# Patient Record
Sex: Female | Born: 1960 | Race: White | Hispanic: No | Smoking: Former smoker
Health system: Southern US, Community
[De-identification: ages and names within clinical notes are randomized; demographics above are authoritative.]

## PROBLEM LIST (undated history)

## (undated) DIAGNOSIS — F419 Anxiety disorder, unspecified: Secondary | ICD-10-CM

## (undated) DIAGNOSIS — L732 Hidradenitis suppurativa: Secondary | ICD-10-CM

## (undated) DIAGNOSIS — R0602 Shortness of breath: Secondary | ICD-10-CM

## (undated) DIAGNOSIS — I10 Essential (primary) hypertension: Secondary | ICD-10-CM

## (undated) DIAGNOSIS — F32A Depression, unspecified: Secondary | ICD-10-CM

## (undated) DIAGNOSIS — G4730 Sleep apnea, unspecified: Secondary | ICD-10-CM

## (undated) DIAGNOSIS — I119 Hypertensive heart disease without heart failure: Secondary | ICD-10-CM

## (undated) HISTORY — PX: CYSTECTOMY: SUR359

## (undated) HISTORY — DX: Shortness of breath: R06.02

## (undated) HISTORY — DX: Hypertensive heart disease without heart failure: I11.9

## (undated) HISTORY — DX: Hidradenitis suppurativa: L73.2

---

## 1983-02-14 HISTORY — PX: HERNIA REPAIR: SHX51

## 1994-08-14 ENCOUNTER — Encounter (INDEPENDENT_AMBULATORY_CARE_PROVIDER_SITE_OTHER): Payer: Self-pay | Admitting: Internal Medicine

## 1994-08-14 LAB — CONVERTED CEMR LAB: Pap Smear: NORMAL

## 1996-10-14 ENCOUNTER — Encounter (INDEPENDENT_AMBULATORY_CARE_PROVIDER_SITE_OTHER): Payer: Self-pay | Admitting: Internal Medicine

## 1996-10-14 LAB — CONVERTED CEMR LAB: Pap Smear: NORMAL

## 1997-05-18 ENCOUNTER — Encounter: Admission: RE | Admit: 1997-05-18 | Discharge: 1997-08-16 | Payer: Self-pay | Admitting: Specialist

## 1998-09-10 ENCOUNTER — Ambulatory Visit (HOSPITAL_COMMUNITY): Admission: RE | Admit: 1998-09-10 | Discharge: 1998-09-10 | Payer: Self-pay | Admitting: Surgery

## 1999-05-04 ENCOUNTER — Encounter: Payer: Self-pay | Admitting: Family Medicine

## 1999-05-04 ENCOUNTER — Encounter: Admission: RE | Admit: 1999-05-04 | Discharge: 1999-05-04 | Payer: Self-pay | Admitting: Family Medicine

## 1999-08-14 ENCOUNTER — Encounter (INDEPENDENT_AMBULATORY_CARE_PROVIDER_SITE_OTHER): Payer: Self-pay | Admitting: Internal Medicine

## 1999-08-14 LAB — CONVERTED CEMR LAB: Pap Smear: NORMAL

## 1999-09-07 ENCOUNTER — Other Ambulatory Visit: Admission: RE | Admit: 1999-09-07 | Discharge: 1999-09-07 | Payer: Self-pay | Admitting: Family Medicine

## 2003-02-14 HISTORY — PX: KNEE ARTHROSCOPY: SUR90

## 2003-06-17 ENCOUNTER — Encounter: Admission: RE | Admit: 2003-06-17 | Discharge: 2003-06-17 | Payer: Self-pay | Admitting: Orthopedic Surgery

## 2003-07-01 ENCOUNTER — Ambulatory Visit (HOSPITAL_BASED_OUTPATIENT_CLINIC_OR_DEPARTMENT_OTHER): Admission: RE | Admit: 2003-07-01 | Discharge: 2003-07-01 | Payer: Self-pay | Admitting: Orthopedic Surgery

## 2003-12-01 ENCOUNTER — Ambulatory Visit: Payer: Self-pay | Admitting: Family Medicine

## 2004-04-18 ENCOUNTER — Ambulatory Visit: Payer: Self-pay | Admitting: Family Medicine

## 2004-06-23 ENCOUNTER — Ambulatory Visit: Payer: Self-pay | Admitting: Family Medicine

## 2004-10-18 ENCOUNTER — Ambulatory Visit: Payer: Self-pay | Admitting: Family Medicine

## 2004-10-21 ENCOUNTER — Ambulatory Visit: Payer: Self-pay | Admitting: Family Medicine

## 2004-12-05 ENCOUNTER — Ambulatory Visit: Payer: Self-pay | Admitting: Family Medicine

## 2005-01-16 ENCOUNTER — Ambulatory Visit: Payer: Self-pay | Admitting: Family Medicine

## 2005-01-23 ENCOUNTER — Ambulatory Visit: Payer: Self-pay | Admitting: Family Medicine

## 2005-02-13 HISTORY — PX: ENDOMETRIAL ABLATION W/ NOVASURE: SUR434

## 2005-08-29 ENCOUNTER — Ambulatory Visit (HOSPITAL_COMMUNITY): Admission: RE | Admit: 2005-08-29 | Discharge: 2005-08-29 | Payer: Self-pay | Admitting: Obstetrics and Gynecology

## 2005-08-29 ENCOUNTER — Encounter (INDEPENDENT_AMBULATORY_CARE_PROVIDER_SITE_OTHER): Payer: Self-pay | Admitting: *Deleted

## 2005-10-30 ENCOUNTER — Ambulatory Visit: Payer: Self-pay | Admitting: Family Medicine

## 2006-05-16 ENCOUNTER — Encounter: Payer: Self-pay | Admitting: Internal Medicine

## 2006-05-16 DIAGNOSIS — I10 Essential (primary) hypertension: Secondary | ICD-10-CM

## 2006-05-16 DIAGNOSIS — N915 Oligomenorrhea, unspecified: Secondary | ICD-10-CM

## 2006-05-16 DIAGNOSIS — F17200 Nicotine dependence, unspecified, uncomplicated: Secondary | ICD-10-CM

## 2006-05-17 ENCOUNTER — Ambulatory Visit: Payer: Self-pay | Admitting: Family Medicine

## 2006-11-06 ENCOUNTER — Ambulatory Visit: Payer: Self-pay | Admitting: Family Medicine

## 2006-11-12 ENCOUNTER — Telehealth (INDEPENDENT_AMBULATORY_CARE_PROVIDER_SITE_OTHER): Payer: Self-pay | Admitting: *Deleted

## 2006-12-10 ENCOUNTER — Telehealth (INDEPENDENT_AMBULATORY_CARE_PROVIDER_SITE_OTHER): Payer: Self-pay | Admitting: *Deleted

## 2006-12-11 ENCOUNTER — Ambulatory Visit: Payer: Self-pay | Admitting: Family Medicine

## 2006-12-11 ENCOUNTER — Telehealth (INDEPENDENT_AMBULATORY_CARE_PROVIDER_SITE_OTHER): Payer: Self-pay | Admitting: Internal Medicine

## 2007-01-03 ENCOUNTER — Ambulatory Visit: Payer: Self-pay

## 2007-01-03 ENCOUNTER — Encounter (INDEPENDENT_AMBULATORY_CARE_PROVIDER_SITE_OTHER): Payer: Self-pay | Admitting: Internal Medicine

## 2007-01-31 ENCOUNTER — Encounter (INDEPENDENT_AMBULATORY_CARE_PROVIDER_SITE_OTHER): Payer: Self-pay | Admitting: *Deleted

## 2007-07-03 ENCOUNTER — Telehealth (INDEPENDENT_AMBULATORY_CARE_PROVIDER_SITE_OTHER): Payer: Self-pay | Admitting: Internal Medicine

## 2007-08-14 ENCOUNTER — Ambulatory Visit: Payer: Self-pay | Admitting: Family Medicine

## 2007-08-14 DIAGNOSIS — N6089 Other benign mammary dysplasias of unspecified breast: Secondary | ICD-10-CM

## 2007-09-03 ENCOUNTER — Telehealth (INDEPENDENT_AMBULATORY_CARE_PROVIDER_SITE_OTHER): Payer: Self-pay | Admitting: Internal Medicine

## 2007-09-04 ENCOUNTER — Ambulatory Visit: Payer: Self-pay | Admitting: Family Medicine

## 2007-09-06 ENCOUNTER — Telehealth: Payer: Self-pay | Admitting: Family Medicine

## 2009-04-01 ENCOUNTER — Encounter: Payer: Self-pay | Admitting: Cardiology

## 2010-07-01 NOTE — Op Note (Signed)
NAME:  Savannah Roy, Savannah Roy              ACCOUNT NO.:  [id number]   MEDICAL RECORD NO.:  [id number]          PATIENT TYPE:  AMB   LOCATION:  SDC                           FACILITY:  [facility]   PHYSICIAN:  Lenoard Aden, M.D.DATE OF BIRTH:  01-May-1960   DATE OF PROCEDURE:  08/29/2005  DATE OF DISCHARGE:                                 OPERATIVE REPORT   PREOPERATIVE DIAGNOSIS:  Menorrhagia, dysmenorrhea.   POSTOPERATIVE DIAGNOSIS:  Menorrhagia, dysmenorrhea.   OPERATION PERFORMED:  Diagnostic hysteroscopy, dilation and curettage,  NovaSure endometrial ablation.   SURGEON:  Lenoard Aden, M.D.   ANESTHESIA:  General.   ESTIMATED BLOOD LOSS:  50 mL.   COMPLICATIONS:  None.   FLUID DEFICIT:  45 mL.   Patient to recovery in good condition.   SPECIMENS:  Endometrial curettings to pathology.   DESCRIPTION OF PROCEDURE:  After being apprised of the risks of anesthesia,  infection, bleeding, injury to abdominal with need for repair, delayed  versus immediate complications to include bowel or bladder injury, the  patient was brought to the operating room.  She was administered general  anesthetic without complications, prepped and draped in the usual sterile  fashion, catheterized until the bladder was empty.  After placing a  tenaculum on the anterior lip of the cervix and a weighted speculum, the  uterus was dilated up to a #24 Pratt dilator.  Hysteroscope placed.  Visualization reveals a limited but otherwise normal view of the endometrial  cavity, previous sonohysterogram which has confirmed no evidence of  structural defects.  Dilation and curettage was performed for a moderate  amount of tissue which was removed.  Hysteroscope replaced revealing a  normal endometrial cavity.  At this time attempts were made to enter the  NovaSure which met with difficulty due to the acute angulation of the  patient's uterus and the acute anteversion and the patient's obesity, so the  cervix was  further dilated up to #43 [facility] dilator.  At this time the  speculum was removed and NovaSure was able to be placed.  Cavity width of  3.5 cm was noted after seating the device.  CO2 testing was negative.  Procedure was initiated with the ablation occurring over a  period of 67 seconds without difficulty.  Device was removed.  Minimal  bleeding was noted.  All instruments were removed from the uterus and from  the vagina.  The patient tolerated the procedure well and was transferred to  recovery in good condition.      Lenoard Aden, M.D.  Electronically Signed     RJT/MEDQ  D:  08/29/2005  T:  08/29/2005  Job:  [id number]

## 2010-07-01 NOTE — Op Note (Signed)
NAME:  Savannah Roy, LAPINE                        ACCOUNT NO.:  [id number]   MEDICAL RECORD NO.:  [id number]                   PATIENT TYPE:  AMB   LOCATION:  DSC                                  FACILITY:  [facility]   PHYSICIAN:  Mila Homer. Sherlean Foot, M.D.              DATE OF BIRTH:  02-10-1961   DATE OF PROCEDURE:  07/01/2003  DATE OF DISCHARGE:                                 OPERATIVE REPORT   PREOPERATIVE DIAGNOSIS:  Right knee meniscus tear and osteoarthritis.   POSTOPERATIVE DIAGNOSIS:  Right knee meniscus tear and osteoarthritis.   OPERATION PERFORMED:  Right knee arthroscopy with partial lateral  meniscectomy, chondroplasty of the lateral, medial and patellofemoral  compartments.   SURGEON:  Mila Homer. Sherlean Foot, M.D.   ASSISTANT:  None.   ANESTHESIA:  General.   INDICATIONS FOR PROCEDURE:  The patient is a 50 year old obese female with  mechanical symptoms and osteoarthritis.  Informed consent was obtained.   DESCRIPTION OF PROCEDURE:  The patient was laid supine and administered MAC  anesthesia.  The right lower extremity was prepped and draped in the usual  sterile fashion.  A #11 blade and blunt trocar and cannula were used to  create inferolateral and inferomedial portals.  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  I used a Biochemist, clinical to perform a chondroplasty on the  patella.  Also some on the trochlea.  There was a small area of grade 4  chondromalacia on the trochlea.  We then went into the medial compartment.  There were some femoral condylar grade 3 and 4 changes in small areas.  I  debrided that as well.  Meniscus was fine.  The ACL and PCL were fine.  There was a very large anterior and posterior horn tear of the lateral  meniscus.  These were debrided with basket forceps, [place] shavers and  a debridement wand.  I then lavaged the knee and closed with 4-0 nylon  sutures, dressed with  Adaptic, 4 x 4s, sterile Webril and an Ace wrap.   COMPLICATIONS:  None.   DRAINS:  None.                                               Mila Homer. Sherlean Foot, M.D.    SDL/MEDQ  D:  07/01/2003  T:  07/02/2003  Job:  [id number]

## 2012-03-21 LAB — LIPID PANEL
CHOLESTEROL: 221 mg/dL — AB (ref 0–200)
HDL: 31 mg/dL — AB (ref 35–70)
LDL Cholesterol: 164 mg/dL
Triglycerides: 128 mg/dL (ref 40–160)

## 2012-03-21 LAB — HEMOGLOBIN A1C: Hemoglobin A1C: 5.3

## 2012-03-21 LAB — TSH: TSH: 3.68 u[IU]/mL (ref 0.41–5.90)

## 2012-03-21 LAB — HM PAP SMEAR: HM PAP: NEGATIVE

## 2014-06-06 ENCOUNTER — Emergency Department (HOSPITAL_COMMUNITY)
Admission: EM | Admit: 2014-06-06 | Discharge: 2014-06-06 | Disposition: A | Discharge status: Home / Self Care | Payer: BLUE CROSS/BLUE SHIELD | Attending: Emergency Medicine | Admitting: Emergency Medicine

## 2014-06-06 ENCOUNTER — Encounter (HOSPITAL_COMMUNITY): Payer: Self-pay | Admitting: Emergency Medicine

## 2014-06-06 ENCOUNTER — Emergency Department (HOSPITAL_COMMUNITY): Payer: BLUE CROSS/BLUE SHIELD

## 2014-06-06 DIAGNOSIS — I10 Essential (primary) hypertension: Secondary | ICD-10-CM | POA: Insufficient documentation

## 2014-06-06 DIAGNOSIS — R112 Nausea with vomiting, unspecified: Secondary | ICD-10-CM | POA: Insufficient documentation

## 2014-06-06 DIAGNOSIS — R1013 Epigastric pain: Secondary | ICD-10-CM | POA: Diagnosis present

## 2014-06-06 DIAGNOSIS — Z87891 Personal history of nicotine dependence: Secondary | ICD-10-CM | POA: Insufficient documentation

## 2014-06-06 HISTORY — DX: Essential (primary) hypertension: I10

## 2014-06-06 LAB — URINALYSIS, ROUTINE W REFLEX MICROSCOPIC
Glucose, UA: NEGATIVE mg/dL
Hgb urine dipstick: NEGATIVE
Leukocytes, UA: NEGATIVE
NITRITE: NEGATIVE
PH: 5.5 (ref 5.0–8.0)
Protein, ur: 30 mg/dL — AB
Specific Gravity, Urine: >1.03 — ABNORMAL HIGH (ref 1.005–1.030)
UROBILINOGEN UA: 1 mg/dL (ref 0.0–1.0)

## 2014-06-06 LAB — URINE MICROSCOPIC-ADD ON

## 2014-06-06 LAB — CBC WITH DIFFERENTIAL/PLATELET
BASOS ABS: 0 10*3/uL (ref 0.0–0.1)
BASOS PCT: 0 % (ref 0–1)
EOS ABS: 0.1 10*3/uL (ref 0.0–0.7)
EOS PCT: 1 % (ref 0–5)
HCT: 45.2 % (ref 36.0–46.0)
Hemoglobin: 14.6 g/dL (ref 12.0–15.0)
Lymphocytes Relative: 16 % (ref 12–46)
Lymphs Abs: 0.9 10*3/uL (ref 0.7–4.0)
MCH: 28.7 pg (ref 26.0–34.0)
MCHC: 32.3 g/dL (ref 30.0–36.0)
MCV: 88.8 fL (ref 78.0–100.0)
Monocytes Absolute: 0.4 10*3/uL (ref 0.1–1.0)
Monocytes Relative: 7 % (ref 3–12)
Neutro Abs: 4.2 10*3/uL (ref 1.7–7.7)
Neutrophils Relative %: 76 % (ref 43–77)
PLATELETS: 200 10*3/uL (ref 150–400)
RBC: 5.09 MIL/uL (ref 3.87–5.11)
RDW: 14.9 % (ref 11.5–15.5)
WBC: 5.6 10*3/uL (ref 4.0–10.5)

## 2014-06-06 LAB — COMPREHENSIVE METABOLIC PANEL
ALK PHOS: 70 U/L (ref 39–117)
ALT: 59 U/L — AB (ref 0–35)
AST: 58 U/L — AB (ref 0–37)
Albumin: 4 g/dL (ref 3.5–5.2)
Anion gap: 11 (ref 5–15)
BILIRUBIN TOTAL: 2.4 mg/dL — AB (ref 0.3–1.2)
BUN: 20 mg/dL (ref 6–23)
CHLORIDE: 104 mmol/L (ref 96–112)
CO2: 23 mmol/L (ref 19–32)
Calcium: 8.5 mg/dL (ref 8.4–10.5)
Creatinine, Ser: 1.15 mg/dL — ABNORMAL HIGH (ref 0.50–1.10)
GFR, EST AFRICAN AMERICAN: 61 mL/min — AB (ref >90)
GFR, EST NON AFRICAN AMERICAN: 53 mL/min — AB (ref >90)
GLUCOSE: 107 mg/dL — AB (ref 70–99)
POTASSIUM: 3.6 mmol/L (ref 3.5–5.1)
SODIUM: 138 mmol/L (ref 135–145)
Total Protein: 7.3 g/dL (ref 6.0–8.3)

## 2014-06-06 LAB — LIPASE, BLOOD: Lipase: 20 U/L (ref 11–59)

## 2014-06-06 MED ORDER — ONDANSETRON HCL 4 MG/2ML IJ SOLN
4.0000 mg | Freq: Once | INTRAMUSCULAR | Status: AC
  Administered 2014-06-06: 4 mg via INTRAVENOUS
  Filled 2014-06-06: qty 2

## 2014-06-06 MED ORDER — MORPHINE SULFATE 4 MG/ML IJ SOLN
4.0000 mg | Freq: Once | INTRAMUSCULAR | Status: AC
  Administered 2014-06-06: 4 mg via INTRAVENOUS
  Filled 2014-06-06: qty 1

## 2014-06-06 MED ORDER — SODIUM CHLORIDE 0.9 % IV BOLUS (SEPSIS)
1000.0000 mL | Freq: Once | INTRAVENOUS | Status: AC
  Administered 2014-06-06: 1000 mL via INTRAVENOUS

## 2014-06-06 MED ORDER — DICYCLOMINE HCL 20 MG PO TABS: 20.0000 mg | ORAL_TABLET | Freq: Two times a day (BID) | ORAL | Status: DC

## 2014-06-06 MED ORDER — ONDANSETRON HCL 4 MG PO TABS: 4.0000 mg | ORAL_TABLET | Freq: Three times a day (TID) | ORAL | Status: DC | PRN

## 2014-06-06 MED ORDER — IOHEXOL 300 MG/ML  SOLN
50.0000 mL | Freq: Once | INTRAMUSCULAR | Status: AC | PRN
  Administered 2014-06-06: 50 mL via ORAL

## 2014-06-06 MED ORDER — IOHEXOL 300 MG/ML  SOLN
100.0000 mL | Freq: Once | INTRAMUSCULAR | Status: AC | PRN
  Administered 2014-06-06: 100 mL via INTRAVENOUS

## 2014-06-06 MED ORDER — DOCUSATE SODIUM 100 MG PO CAPS: 100.0000 mg | ORAL_CAPSULE | Freq: Two times a day (BID) | ORAL | Status: DC

## 2014-06-06 NOTE — ED Notes (Signed)
Pt sent from [facility].  States that she has been having lt sided burning abd pain.  States that she began throwing up on Wednesday and last vomiting was last night.  Was sent to r/o blockage.  States that last BM was Thursday and it was normal.

## 2014-06-06 NOTE — ED Provider Notes (Signed)
CSN: [id number]     Arrival date & time 06/06/14  1600 History   First MD Initiated Contact with Patient 06/06/14 1659     Chief Complaint  Patient presents with  . Abdominal Pain     (Consider location/radiation/quality/duration/timing/severity/associated sxs/prior Treatment) HPI  Savannah Roy is a 54 y.o. female complaining of multiple episodes of nonbloody, nonbilious, non-coffee ground emesis worsening over the course of 5 days with associated abdominal bloating, burning sensation in the epigastrium and left upper quadrant which she rates at 7 out of 10 and 2 episodes of smaller than normal, normally formed stool. Passing flatus and states that she is burping frequently. Feels that she is dehydrated, with decreased urinary output and concentrated urine without dysuria or hematuria. Denies fever, chills. Had single prior abdominal surgery of umbilical hernia repair in the 80s. Sent from urgent care, they are concerned for SBO.  Past Medical History  Diagnosis Date  . Hypertension    History reviewed. No pertinent past surgical history. History reviewed. No pertinent family history. History  Substance Use Topics  . Smoking status: Former Games developer  . Smokeless tobacco: Not on file  . Alcohol Use: No   OB History    No data available     Review of Systems  10 systems reviewed and found to be negative, except as noted in the HPI.   Allergies  Erythromycin  Home Medications   Prior to Admission medications   Not on File   BP 141/85 mmHg  Pulse 100  Temp(Src) 98.8 F (37.1 C) (Oral)  Resp 18  SpO2 100% Physical Exam  Constitutional: She is oriented to person, place, and time. She appears well-developed and well-nourished. No distress.  HENT:  Head: Normocephalic.  Eyes: Conjunctivae and EOM are normal.  Cardiovascular: Normal rate, regular rhythm and intact distal pulses.   Pulmonary/Chest: Effort normal. No stridor.  Abdominal:  Difficult to appreciate bowel  sounds secondary to habitus. Tender to palpation in the left upper quadrant with no guarding or rebound.  Musculoskeletal: Normal range of motion.  Neurological: She is alert and oriented to person, place, and time.  Psychiatric: She has a normal mood and affect.  Nursing note and vitals reviewed.   ED Course  Procedures (including critical care time) Labs Review Labs Reviewed  CBC WITH DIFFERENTIAL/PLATELET  COMPREHENSIVE METABOLIC PANEL  LIPASE, BLOOD  URINALYSIS, ROUTINE W REFLEX MICROSCOPIC     Imaging Review No results found.   EKG Interpretation None      MDM   Final diagnoses:  Non-intractable vomiting with nausea, vomiting of unspecified type    Filed Vitals:   06/06/14 1625  BP: 141/85  Pulse: 100  Temp: 98.8 F (37.1 C)  TempSrc: Oral  Resp: 18  SpO2: 100%    Medications  sodium chloride 0.9 % bolus 1,000 mL (not administered)  ondansetron (ZOFRAN) injection 4 mg (not administered)  morphine 4 MG/ML injection 4 mg (not administered)  iohexol (OMNIPAQUE) 300 MG/ML solution 50 mL (not administered)    Savannah Roy is a pleasant 54 y.o. female presenting with burning abdominal pain, nausea vomiting. Patient sent for urgent care concern for SBO. She is passing flatus normally. One prior abdominal surgery. Serial abdominal exams remained nonsurgical. Patient is tolerated her oral contrast without issue. No leukocytosis, she has a mild creatinine bump at 1.15, AST and ALTs are elevated slightly at 58 and 59 consistent with a nonspecific elevation from emesis. Urinalysis is contaminated but is on concerning for infection.  CT scan shows a distended loop of the jejunum in the left upper quadrant consistent with an atonic segment of bowel. Discussed case with attending physician and patient. Patient feels comfortable going home, she is tolerating by mouth. I will discharge her with Zofran, Bentyl and Colace. We've had an extensive discussion of return precautions.  Also given her gastroenterology referral.  Evaluation does not show pathology that would require ongoing emergent intervention or inpatient treatment. Pt is hemodynamically stable and mentating appropriately. Discussed findings and plan with patient/guardian, who agrees with care plan. All questions answered. Return precautions discussed and outpatient follow up given.   Discharge Medication List as of 06/06/2014  8:59 PM    START taking these medications   Details  dicyclomine (BENTYL) 20 MG tablet Take 1 tablet (20 mg total) by mouth 2 (two) times daily., Starting 06/06/2014, Until Discontinued, Print    docusate sodium (COLACE) 100 MG capsule Take 1 capsule (100 mg total) by mouth every 12 (twelve) hours., Starting 06/06/2014, Until Discontinued, Print    ondansetron (ZOFRAN) 4 MG tablet Take 1 tablet (4 mg total) by mouth every 8 (eight) hours as needed for nausea or vomiting., Starting 06/06/2014, Until Discontinued, Print             Savannah Emeryicole Katelynne Revak, PA-C 06/06/14 2207  Elwin MochaBlair Walden, MD 06/06/14 2227

## 2014-06-06 NOTE — Discharge Instructions (Signed)
Please follow with your primary care doctor in the next 2 days for a check-up. They must obtain records for further management.   Do not hesitate to return to the Emergency Department for any new, worsening or concerning symptoms.    Nausea and Vomiting Nausea is a sick feeling that often comes before throwing up (vomiting). Vomiting is a reflex where stomach contents come out of your mouth. Vomiting can cause severe loss of body fluids (dehydration). Children and elderly adults can become dehydrated quickly, especially if they also have diarrhea. Nausea and vomiting are symptoms of a condition or disease. It is important to find the cause of your symptoms. CAUSES   Direct irritation of the stomach lining. This irritation can result from increased acid production (gastroesophageal reflux disease), infection, food poisoning, taking certain medicines (such as nonsteroidal anti-inflammatory drugs), alcohol use, or tobacco use.  Signals from the brain.These signals could be caused by a headache, heat exposure, an inner ear disturbance, increased pressure in the brain from injury, infection, a tumor, or a concussion, pain, emotional stimulus, or metabolic problems.  An obstruction in the gastrointestinal tract (bowel obstruction).  Illnesses such as diabetes, hepatitis, gallbladder problems, appendicitis, kidney problems, cancer, sepsis, atypical symptoms of a heart attack, or eating disorders.  Medical treatments such as chemotherapy and radiation.  Receiving medicine that makes you sleep (general anesthetic) during surgery. DIAGNOSIS Your caregiver may ask for tests to be done if the problems do not improve after a few days. Tests may also be done if symptoms are severe or if the reason for the nausea and vomiting is not clear. Tests may include:  Urine tests.  Blood tests.  Stool tests.  Cultures (to look for evidence of infection).  X-rays or other imaging studies. Test results can  help your caregiver make decisions about treatment or the need for additional tests. TREATMENT You need to stay well hydrated. Drink frequently but in small amounts.You may wish to drink water, sports drinks, clear broth, or eat frozen ice pops or gelatin dessert to help stay hydrated.When you eat, eating slowly may help prevent nausea.There are also some antinausea medicines that may help prevent nausea. HOME CARE INSTRUCTIONS   Take all medicine as directed by your caregiver.  If you do not have an appetite, do not force yourself to eat. However, you must continue to drink fluids.  If you have an appetite, eat a normal diet unless your caregiver tells you differently.  Eat a variety of complex carbohydrates (rice, wheat, potatoes, bread), lean meats, yogurt, fruits, and vegetables.  Avoid high-fat foods because they are more difficult to digest.  Drink enough water and fluids to keep your urine clear or pale yellow.  If you are dehydrated, ask your caregiver for specific rehydration instructions. Signs of dehydration may include:  Severe thirst.  Dry lips and mouth.  Dizziness.  Dark urine.  Decreasing urine frequency and amount.  Confusion.  Rapid breathing or pulse. SEEK IMMEDIATE MEDICAL CARE IF:   You have blood or brown flecks (like coffee grounds) in your vomit.  You have black or bloody stools.  You have a severe headache or stiff neck.  You are confused.  You have severe abdominal pain.  You have chest pain or trouble breathing.  You do not urinate at least once every 8 hours.  You develop cold or clammy skin.  You continue to vomit for longer than 24 to 48 hours.  You have a fever. MAKE SURE YOU:  Understand these instructions.  Will watch your condition.  Will get help right away if you are not doing well or get worse. Document Released: 01/30/2005 Document Revised: 04/24/2011 Document Reviewed: 06/29/2010 [facility] Patient Information  2015 [place], [place]. This information is not intended to replace advice given to you by your health care provider. Make sure you discuss any questions you have with your health care provider.

## 2014-06-06 NOTE — ED Notes (Signed)
Patient transported to CT 

## 2014-06-10 ENCOUNTER — Other Ambulatory Visit (HOSPITAL_COMMUNITY): Payer: Self-pay | Admitting: Gastroenterology

## 2014-06-10 DIAGNOSIS — R7989 Other specified abnormal findings of blood chemistry: Secondary | ICD-10-CM

## 2014-06-10 DIAGNOSIS — R945 Abnormal results of liver function studies: Secondary | ICD-10-CM

## 2014-06-10 DIAGNOSIS — R1012 Left upper quadrant pain: Secondary | ICD-10-CM

## 2014-06-15 ENCOUNTER — Ambulatory Visit (HOSPITAL_COMMUNITY)
Admission: RE | Admit: 2014-06-15 | Discharge: 2014-06-15 | Disposition: A | Discharge status: Home / Self Care | Payer: BLUE CROSS/BLUE SHIELD | Source: Ambulatory Visit | Attending: Gastroenterology | Admitting: Gastroenterology

## 2014-06-15 DIAGNOSIS — R945 Abnormal results of liver function studies: Secondary | ICD-10-CM

## 2014-06-15 DIAGNOSIS — D734 Cyst of spleen: Secondary | ICD-10-CM | POA: Diagnosis not present

## 2014-06-15 DIAGNOSIS — R1012 Left upper quadrant pain: Secondary | ICD-10-CM | POA: Diagnosis present

## 2014-06-15 DIAGNOSIS — R7989 Other specified abnormal findings of blood chemistry: Secondary | ICD-10-CM

## 2014-06-15 DIAGNOSIS — K8020 Calculus of gallbladder without cholecystitis without obstruction: Secondary | ICD-10-CM | POA: Diagnosis not present

## 2014-07-01 ENCOUNTER — Ambulatory Visit (HOSPITAL_COMMUNITY): Payer: BLUE CROSS/BLUE SHIELD

## 2014-08-14 ENCOUNTER — Ambulatory Visit (INDEPENDENT_AMBULATORY_CARE_PROVIDER_SITE_OTHER): Payer: BLUE CROSS/BLUE SHIELD | Admitting: Podiatry

## 2014-08-14 VITALS — BP 168/93 | HR 89 | Resp 14

## 2014-08-14 DIAGNOSIS — M7752 Other enthesopathy of left foot: Secondary | ICD-10-CM

## 2014-08-14 DIAGNOSIS — M7662 Achilles tendinitis, left leg: Secondary | ICD-10-CM | POA: Diagnosis not present

## 2014-08-14 DIAGNOSIS — R52 Pain, unspecified: Secondary | ICD-10-CM

## 2014-08-14 MED ORDER — OXAPROZIN 600 MG PO TABS: 600.0000 mg | ORAL_TABLET | Freq: Every day | ORAL | Status: AC

## 2014-08-14 NOTE — Progress Notes (Signed)
   Subjective:    Patient ID: Savannah Roy, female    DOB: 12-26-1960, 54 y.o.   MRN: [id number]  HPI  I have been having heel pain in my left heel for the last 2 weeks and it is getting worse.  This patient has pain in the back of her left heel for the last 2 weeks.  She says she works all day and then the back of the heels aches causing her to remain off her feet.  She says she had no history of trauma to her foot.  No self treatment.    Review of Systems  All other systems reviewed and are negative.      Objective:   Physical Exam Objective: Review of past medical history, medications, social history and allergies were performed.  Vascular: Dorsalis pedis and posterior tibial pulses were palpable B/L, capillary refill was  WNL B/L, temperature gradient was WNL B/L   Skin:  No signs of symptoms of infection or ulcers on both feet  Nails: appear healthy with no signs of mycosis or infections  Sensory: Semmes Weinstein monifilament WNL   Orthopedic: Orthopedic evaluation demonstrates all joints distal t ankle have full ROM without crepitus, muscle power WNL B/L.  She has bony prominence in the back of left heel.  Palpable pain at the insertion of achilles tendon left foot.Increased temperature indicating inflammation.        Assessment & Plan:  A.  Achilles tendinitis  2. Retrocalaneal bursitis left heel  P.IE X-ray.  Prescribed Daypro meds.  Gave her physical therapy instructions..Marland Kitchen

## 2014-08-28 ENCOUNTER — Encounter: Payer: Self-pay | Admitting: Podiatry

## 2014-08-28 ENCOUNTER — Ambulatory Visit (INDEPENDENT_AMBULATORY_CARE_PROVIDER_SITE_OTHER): Payer: BLUE CROSS/BLUE SHIELD | Admitting: Podiatry

## 2014-08-28 VITALS — BP 174/86 | HR 87 | Resp 18

## 2014-08-28 DIAGNOSIS — M7662 Achilles tendinitis, left leg: Secondary | ICD-10-CM

## 2014-08-28 DIAGNOSIS — M7752 Other enthesopathy of left foot: Secondary | ICD-10-CM

## 2014-08-28 NOTE — Progress Notes (Signed)
Subjective:     Patient ID: Savannah Roy, female   DOB: [birth year], 54 y.o.   MRN: [id number]  HPIPatient returns to office after two weeks of taking Daypro and wearing appropriate footgear.  She says the pain has lessened and she can now mover her left foot and ankle.  She says she has occasional twinges and is pleased with her progress.   Review of Systems     Objective:   Physical Exam Objective: Review of past medical history, medications, social history and allergies were performed.  Vascular: Dorsalis pedis and posterior tibial pulses were palpable B/L, capillary refill was  WNL B/L, temperature gradient was WNL B/L   Skin:  No signs of symptoms of infection or ulcers on both feet  Nails: appear healthy with no signs of mycosis or infections  Sensory: Semmes Weinstein monifilament WNL   Orthopedic: Orthopedic evaluation demonstrates all joints distal t ankle have full ROM without crepitus, muscle power WNL B/L.  The redness and swelling and increased warmth has resolved left foot. Increased foot ROM noted.     Assessment:  Achilles Tendinitis  Retrocalcaneal bursitis     Plan:     ROV  Prescribed Daypro refill.  RTC 4 weeks.

## 2014-09-08 ENCOUNTER — Telehealth: Payer: Self-pay | Admitting: *Deleted

## 2014-09-08 MED ORDER — OXAPROZIN 600 MG PO TABS: 600.0000 mg | ORAL_TABLET | Freq: Two times a day (BID) | ORAL | Status: DC

## 2014-09-08 NOTE — Telephone Encounter (Signed)
Pt states refill was not called to [facility] [place]. Dr. Stacie Acres states refill Daypro  #30 one tablet bid +2 refills.  Done and pt informed [phone].

## 2014-09-25 ENCOUNTER — Ambulatory Visit: Payer: BLUE CROSS/BLUE SHIELD | Admitting: Podiatry

## 2014-12-11 ENCOUNTER — Telehealth: Payer: Self-pay | Admitting: Podiatry

## 2014-12-11 ENCOUNTER — Encounter: Payer: Self-pay | Admitting: *Deleted

## 2014-12-11 NOTE — Telephone Encounter (Signed)
90 day prescription request  Medication(Oxaprozin- 600 mg cap. Quantity -90) Description for use-take 1 tablet by mouth every day, 0 refills prescriber-Mayer,Gregory [id number]  [address] [place], [phone]

## 2015-10-01 ENCOUNTER — Ambulatory Visit (INDEPENDENT_AMBULATORY_CARE_PROVIDER_SITE_OTHER): Payer: BLUE CROSS/BLUE SHIELD | Admitting: Podiatry

## 2015-10-01 ENCOUNTER — Encounter: Payer: Self-pay | Admitting: Podiatry

## 2015-10-01 VITALS — BP 171/63 | HR 85 | Resp 14

## 2015-10-01 DIAGNOSIS — R52 Pain, unspecified: Secondary | ICD-10-CM

## 2015-10-01 DIAGNOSIS — M7752 Other enthesopathy of left foot: Secondary | ICD-10-CM | POA: Diagnosis not present

## 2015-10-01 DIAGNOSIS — M7662 Achilles tendinitis, left leg: Secondary | ICD-10-CM | POA: Diagnosis not present

## 2015-10-01 NOTE — Progress Notes (Signed)
Subjective:     Patient ID: Savannah Roy, female   DOB: [birth year], 55 y.o.   MRN: [id number]  HPIPatient returns to office Saying she has still been experiencing pain noted on the back of her left heel. She states the area has become increasingly painful and it hurts as she moves around. She does have history of retrocalcaneal exostectomy of the right foot, which has healed. She is concerned about the pain in the thickness of the bone on the back of her left heel bone and presents the office today for an evaluation and treatment of this condition   Review of Systems     Objective:   Physical Exam Objective: Review of past medical history, medications, social history and allergies were performed.  Vascular: Dorsalis pedis and posterior tibial pulses were palpable B/L, capillary refill was  WNL B/L, temperature gradient was WNL B/L   Skin:  No signs of symptoms of infection or ulcers on both feet  Nails: appear healthy with no signs of mycosis or infections  Sensory: Semmes Weinstein monifilament WNL   Orthopedic: Orthopedic evaluation demonstrates all joints distal t ankle have full ROM without crepitus, muscle power WNL B/L.  The redness and swelling noted at the insertion achilles tendon left foot.     Assessment:  Achilles Tendinitis  Retrocalcaneal bursitis     Plan:     ROV  Prescribed Daypro refiill for 2 weeks.  Cam walker was dispensed.  RTC 2 weeks.Discussed this condition with this patient and we decided to attempt to immobilize the foot with the Cam Walker to help to reduce the pain and discomfort. We did discuss if the problem persists, we will need to send her for surgical, consult to one of the surgeons in the practice      Helane GuntherGregory Elvie Maines DPM   Helane GuntherGregory Amear Strojny DPM

## 2015-10-01 NOTE — Addendum Note (Signed)
Addended by: Harlon FlorSOUTHERLAND, Kole Hilyard L on: 10/01/2015 01:16 PM   Modules accepted: Orders

## 2015-10-13 ENCOUNTER — Ambulatory Visit (INDEPENDENT_AMBULATORY_CARE_PROVIDER_SITE_OTHER): Payer: BLUE CROSS/BLUE SHIELD | Admitting: Podiatry

## 2015-10-13 VITALS — BP 160/80 | HR 81 | Resp 14

## 2015-10-13 DIAGNOSIS — M7752 Other enthesopathy of left foot: Secondary | ICD-10-CM | POA: Diagnosis not present

## 2015-10-13 DIAGNOSIS — M7662 Achilles tendinitis, left leg: Secondary | ICD-10-CM | POA: Diagnosis not present

## 2015-10-13 NOTE — Progress Notes (Signed)
Subjective:     Patient ID: Savannah Roy, female   DOB: [birth year], 55 y.o.   MRN: [id number]  Foot Pain   Patient returns to office after diagnosis of achilles tendinitis and retrocalcaneal bursitis left heel.  She was dispensed cam walker and has been wearing it for 2 weeks.  She says her pain has resolved.  She says she has not picked up her NSAIDs.  She presents for evaluation and treatment.   Review of Systems     Objective:   Physical Exam Objective: Review of past medical history, medications, social history and allergies were performed.  Vascular: Dorsalis pedis and posterior tibial pulses were palpable B/L, capillary refill was WNL B/L, temperature gradient was WNL B/L   Skin:  No signs of symptoms of infection or ulcers on both feet  Nails: appear healthy with no signs of mycosis or infections  Sensory: Semmes Weinstein monifilament WNL   Orthopedic: Orthopedic evaluation demonstrates all joints distal t ankle have full ROM without crepitus, muscle power WNL B/L.  The redness and swelling noted at the insertion achilles tendon left foot last visit has resolved.  The spurring is still present.     Assessment:  Achilles Tendinitis  Retrocalcaneal bursitis     Plan:     ROV  Prescribed Daypro refiill for 2 weeks.   Wear cam walker as needed.  Take NSAID as needed.  Return to regular footgear as needed.    Helane GuntherGregory Jacques Fife DPM

## 2015-10-15 ENCOUNTER — Telehealth: Payer: Self-pay | Admitting: *Deleted

## 2015-10-15 MED ORDER — OXAPROZIN 600 MG PO TABS: ORAL_TABLET | ORAL | 0 refills | Status: DC

## 2015-10-15 NOTE — Telephone Encounter (Signed)
Pt states she saw Dr. Stacie AcresMayer on Wednesday and an antiinflammatory was to be called in.  I reviewed 10/13/2015 clinical notes and pt was to receive 2 weeks of Daypro. [facility] [place] and to pt.

## 2015-11-18 ENCOUNTER — Other Ambulatory Visit: Payer: Self-pay | Admitting: Podiatry

## 2015-11-19 NOTE — Telephone Encounter (Signed)
If problem continues needs an appt.

## 2016-03-24 ENCOUNTER — Other Ambulatory Visit: Payer: Self-pay | Admitting: Physician Assistant

## 2016-03-24 ENCOUNTER — Encounter: Payer: Self-pay | Admitting: Physician Assistant

## 2016-03-24 ENCOUNTER — Ambulatory Visit (INDEPENDENT_AMBULATORY_CARE_PROVIDER_SITE_OTHER): Payer: BLUE CROSS/BLUE SHIELD | Admitting: Physician Assistant

## 2016-03-24 ENCOUNTER — Ambulatory Visit (INDEPENDENT_AMBULATORY_CARE_PROVIDER_SITE_OTHER): Payer: BLUE CROSS/BLUE SHIELD

## 2016-03-24 VITALS — BP 156/88 | HR 84 | Temp 98.9°F | Ht 66.25 in | Wt 391.4 lb

## 2016-03-24 DIAGNOSIS — I10 Essential (primary) hypertension: Secondary | ICD-10-CM

## 2016-03-24 DIAGNOSIS — R946 Abnormal results of thyroid function studies: Secondary | ICD-10-CM | POA: Diagnosis not present

## 2016-03-24 DIAGNOSIS — R079 Chest pain, unspecified: Secondary | ICD-10-CM

## 2016-03-24 DIAGNOSIS — R945 Abnormal results of liver function studies: Secondary | ICD-10-CM

## 2016-03-24 DIAGNOSIS — R7989 Other specified abnormal findings of blood chemistry: Secondary | ICD-10-CM | POA: Diagnosis not present

## 2016-03-24 LAB — COMPREHENSIVE METABOLIC PANEL
ALBUMIN: 3.7 g/dL (ref 3.6–5.1)
ALT: 92 U/L — ABNORMAL HIGH (ref 6–29)
AST: 105 U/L — AB (ref 10–35)
Alkaline Phosphatase: 103 U/L (ref 33–130)
BILIRUBIN TOTAL: 1.2 mg/dL (ref 0.2–1.2)
BUN: 18 mg/dL (ref 7–25)
CALCIUM: 8.8 mg/dL (ref 8.6–10.4)
CO2: 26 mmol/L (ref 20–31)
Chloride: 106 mmol/L (ref 98–110)
Creat: 0.87 mg/dL (ref 0.50–1.05)
GLUCOSE: 88 mg/dL (ref 65–99)
POTASSIUM: 3.7 mmol/L (ref 3.5–5.3)
Sodium: 142 mmol/L (ref 135–146)
Total Protein: 6.6 g/dL (ref 6.1–8.1)

## 2016-03-24 LAB — CBC WITH DIFFERENTIAL/PLATELET
BASOS PCT: 0 %
Basophils Absolute: 0 cells/uL (ref 0–200)
Eosinophils Absolute: 62 cells/uL (ref 15–500)
Eosinophils Relative: 2 %
HCT: 40.5 % (ref 35.0–45.0)
Hemoglobin: 13.2 g/dL (ref 11.7–15.5)
LYMPHS PCT: 23 %
Lymphs Abs: 713 cells/uL — ABNORMAL LOW (ref 850–3900)
MCH: 28.4 pg (ref 27.0–33.0)
MCHC: 32.6 g/dL (ref 32.0–36.0)
MCV: 87.3 fL (ref 80.0–100.0)
MONOS PCT: 7 %
MPV: 10.9 fL (ref 7.5–12.5)
Monocytes Absolute: 217 cells/uL (ref 200–950)
NEUTROS ABS: 2108 {cells}/uL (ref 1500–7800)
Neutrophils Relative %: 68 %
Platelets: 142 10*3/uL (ref 140–400)
RBC: 4.64 MIL/uL (ref 3.80–5.10)
RDW: 15.8 % — AB (ref 11.0–15.0)
WBC: 3.1 10*3/uL — AB (ref 3.8–10.8)

## 2016-03-24 LAB — TSH: TSH: 4.97 mIU/L — ABNORMAL HIGH

## 2016-03-24 MED ORDER — LISINOPRIL-HYDROCHLOROTHIAZIDE 20-25 MG PO TABS: 1.0000 | ORAL_TABLET | Freq: Every day | ORAL | 0 refills | Status: DC

## 2016-03-24 NOTE — Progress Notes (Signed)
Pre visit review using our clinic review tool, if applicable. No additional management support is needed unless otherwise documented below in the visit note. 

## 2016-03-24 NOTE — Patient Instructions (Addendum)
It was great meeting you today!  If your chest pain or shortness of breath changes in anyway, go to the emergency room!  Take only half of a tablet of the blood pressure medication.  I will see you on Tuesday!  Nonspecific Chest Pain Chest pain can be caused by many different conditions. There is always a chance that your pain could be related to something serious, such as a heart attack or a blood clot in your lungs. Chest pain can also be caused by conditions that are not life-threatening. If you have chest pain, it is very important to follow up with your health care provider. What are the causes? Causes of this condition include:  Heartburn.  Pneumonia or bronchitis.  Anxiety or stress.  Inflammation around your heart (pericarditis) or lung (pleuritis or pleurisy).  A blood clot in your lung.  A collapsed lung (pneumothorax). This can develop suddenly on its own (spontaneous pneumothorax) or from trauma to the chest.  Shingles infection (varicella-zoster virus).  Heart attack.  Damage to the bones, muscles, and cartilage that make up your chest wall. This can include:  Bruised bones due to injury.  Strained muscles or cartilage due to frequent or repeated coughing or overwork.  Fracture to one or more ribs.  Sore cartilage due to inflammation (costochondritis). What increases the risk? Risk factors for this condition may include:  Activities that increase your risk for trauma or injury to your chest.  Respiratory infections or conditions that cause frequent coughing.  Medical conditions or overeating that can cause heartburn.  Heart disease or family history of heart disease.  Conditions or health behaviors that increase your risk of developing a blood clot.  Having had chicken pox (varicella zoster). What are the signs or symptoms? Chest pain can feel like:  Burning or tingling on the surface of your chest or deep in your chest.  Crushing, pressure,  aching, or squeezing pain.  Dull or sharp pain that is worse when you move, cough, or take a deep breath.  Pain that is also felt in your back, neck, shoulder, or arm, or pain that spreads to any of these areas. Your chest pain may come and go, or it may stay constant. How is this diagnosed? Lab tests or other studies may be needed to find the cause of your pain. Your health care provider may have you take a test called an ECG (electrocardiogram). An ECG records your heartbeat patterns at the time the test is performed. You may also have other tests, such as:  Transthoracic echocardiogram (TTE). In this test, sound waves are used to create a picture of the heart structures and to look at how blood flows through your heart.  Transesophageal echocardiogram (TEE).This is a more advanced imaging test that takes images from inside your body. It allows your health care provider to see your heart in finer detail.  Cardiac monitoring. This allows your health care provider to monitor your heart rate and rhythm in real time.  Holter monitor. This is a portable device that records your heartbeat and can help to diagnose abnormal heartbeats. It allows your health care provider to track your heart activity for several days, if needed.  Stress tests. These can be done through exercise or by taking medicine that makes your heart beat more quickly.  Blood tests.  Other imaging tests. How is this treated? Treatment depends on what is causing your chest pain. Treatment may include:  Medicines. These may include:  Acid blockers  for heartburn.  Anti-inflammatory medicine.  Pain medicine for inflammatory conditions.  Antibiotic medicine, if an infection is present.  Medicines to dissolve blood clots.  Medicines to treat coronary artery disease (CAD).  Supportive care for conditions that do not require medicines. This may include:  Resting.  Applying heat or cold packs to injured  areas.  Limiting activities until pain decreases. Follow these instructions at home: Medicines  If you were prescribed an antibiotic, take it as told by your health care provider. Do not stop taking the antibiotic even if you start to feel better.  Take over-the-counter and prescription medicines only as told by your health care provider. Lifestyle  Do not use any products that contain nicotine or tobacco, such as cigarettes and e-cigarettes. If you need help quitting, ask your health care provider.  Do not drink alcohol.  Make lifestyle changes as directed by your health care provider. These may include:  Getting regular exercise. Ask your health care provider to suggest some activities that are safe for you.  Eating a heart-healthy diet. A registered dietitian can help you to learn healthy eating options.  Maintaining a healthy weight.  Managing diabetes, if necessary.  Reducing stress, such as with yoga or relaxation techniques. General instructions  Avoid any activities that bring on chest pain.  If heartburn is the cause for your chest pain, raise (elevate) the head of your bed about 6 inches (15 cm) by putting blocks under the legs. Sleeping with more pillows does not effectively relieve heartburn because it only changes the position of your head.  Keep all follow-up visits as told by your health care provider. This is important. This includes any further testing if your chest pain does not go away. Contact a health care provider if:  Your chest pain does not go away.  You have a rash with blisters on your chest.  You have a fever.  You have chills. Get help right away if:  Your chest pain is worse.  You have a cough that gets worse, or you cough up blood.  You have severe pain in your abdomen.  You have severe weakness.  You faint.  You have sudden, unexplained chest discomfort.  You have sudden, unexplained discomfort in your arms, back, neck, or  jaw.  You have shortness of breath at any time.  You suddenly start to sweat, or your skin gets clammy.  You feel nauseous or you vomit.  You suddenly feel light-headed or dizzy.  Your heart begins to beat quickly, or it feels like it is skipping beats. These symptoms may represent a serious problem that is an emergency. Do not wait to see if the symptoms will go away. Get medical help right away. Call your local emergency services (911 in the [place] Do not drive yourself to the hospital.  This information is not intended to replace advice given to you by your health care provider. Make sure you discuss any questions you have with your health care provider. Document Released: 11/09/2004 Document Revised: 10/25/2015 Document Reviewed: 10/25/2015 Elsevier Interactive Patient Education  2017 [employer]

## 2016-03-24 NOTE — Progress Notes (Signed)
Subjective:    Patient ID: Savannah Roy, female    DOB: 1960/05/22, 57 y.o.   MRN: [id number]  HPI  Savannah Roy is a very pleasant 56 y/o female who presents to clinic today to establish care. Savannah Roy has not had a PCP since 2009 due to loss of insurance and other barriers.  Acute Concerns: Chest pain/SOB: She complains of fatigue, SOB, occasional chest discomfort. The pain is a L-sided pain x 2 months. It lasts about 5 seconds. Occurs both with and without activity. Doesn't happen daily, most recently happened on Monday. Nothing makes the pain better or worse. Feels like a "cramp", pain does not radiate. She has hx of HTN and is concerned that her BP is elevated. Denies n/v/d/c.  Review of Systems  See HPI  Past Medical History:  Diagnosis Date  . Hypertension      Social History   Social History  . Marital status: Divorced    Spouse name: N/A  . Number of children: N/A  . Years of education: N/A   Occupational History  . Not on file.   Social History Main Topics  . Smoking status: Former Games developer  . Smokeless tobacco: Never Used  . Alcohol use Yes     Comment: occasionally  . Drug use: No  . Sexual activity: No   Other Topics Concern  . 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 HERNIA REPAIR  1985    Family History  Problem Relation Age of Onset  . Cancer Mother     Allergies  Allergen Reactions  . Erythromycin     REACTION: rash    Current Outpatient Prescriptions on File Prior to Visit  Medication Sig Dispense Refill  . docusate sodium (COLACE) 100 MG capsule Take 1 capsule (100 mg total) by mouth every 12 (twelve) hours. 30 capsule 0  . ibuprofen (ADVIL,MOTRIN) 200 MG tablet Take 400 mg by mouth every 6 (six) hours as needed for moderate pain (stomach pain).     No current  facility-administered medications on file prior to visit.     BP (!) 156/88 (BP Location: Left Arm, Patient Position: Sitting, Cuff Size: Large)   Pulse 84   Temp 98.9 F (37.2 C) (Oral)   Ht 5' 6.25" (1.683 m)   Wt (!) 391 lb 6.4 oz (177.5 kg)   SpO2 98%   BMI 62.70 kg/m       Objective:   Physical Exam  Constitutional: She appears well-developed and well-nourished. She is cooperative.  Non-toxic appearance. She does not have a sickly appearance. She does not appear ill. No distress.  Neck: Carotid bruit is not present.  Cardiovascular: Normal rate, regular rhythm and normal heart sounds.   Pulmonary/Chest: Effort normal and breath sounds normal. No accessory muscle usage. No respiratory distress.  Musculoskeletal:  LE 2+ edema   Neurological: She is alert.  Skin: Skin is warm, dry and intact.  Nursing note and vitals reviewed.  EKG tracing is personally reviewed.  EKG notes NSR.  No acute changes.   CXR without acute abnormalities.     Assessment & Plan:   Problem List Items Addressed This Visit      Cardiovascular and Mediastinum   Essential hypertension    Start lisinopril-HCTZ 20-25mg . Instructed to  only take half of a tablet until she follows up with me. Labs drawn and pending.      Relevant Medications   lisinopril-hydrochlorothiazide (PRINZIDE,ZESTORETIC) 20-25 MG tablet   Other Relevant Orders   Comprehensive metabolic panel   B Nat Peptide   CBC with Differential/Platelet   TSH   Magnesium   DG Chest 2 View (Completed)   Ambulatory referral to Cardiology     Other   Chest pain - Primary    EKG today without acute abnormalities. Referral to cardiology placed. Advised patient that if chest pain changes in any way she should go to the ER immediately. Labs drawn and pending.      Relevant Orders   EKG 12-Lead (Completed)   Comprehensive metabolic panel   B Nat Peptide   CBC with Differential/Platelet   TSH   Magnesium   DG Chest 2 View (Completed)    Ambulatory referral to Cardiology

## 2016-03-24 NOTE — Addendum Note (Signed)
Addended by: Haynes BastWORLEY, Syre Knerr J on: 03/24/2016 07:32 PM   Modules accepted: Orders

## 2016-03-24 NOTE — Assessment & Plan Note (Addendum)
Start lisinopril-HCTZ 20-25mg . Instructed to only take half of a tablet until she follows up with me. Labs drawn and pending.

## 2016-03-24 NOTE — Assessment & Plan Note (Addendum)
EKG and CXR today without acute abnormalities. Referral to cardiology placed. Advised patient that if chest pain changes in any way she should go to the ER immediately. Labs drawn and pending.

## 2016-03-25 LAB — BRAIN NATRIURETIC PEPTIDE: Brain Natriuretic Peptide: 60.9 pg/mL (ref <100)

## 2016-03-25 LAB — MAGNESIUM: Magnesium: 2 mg/dL (ref 1.5–2.5)

## 2016-03-27 ENCOUNTER — Other Ambulatory Visit: Payer: Self-pay

## 2016-03-27 LAB — T4, FREE: FREE T4: 1 ng/dL (ref 0.8–1.8)

## 2016-03-27 LAB — HEPATITIS PANEL, ACUTE

## 2016-03-27 LAB — T3, FREE: T3, Free: 2.7 pg/mL (ref 2.3–4.2)

## 2016-03-27 NOTE — Addendum Note (Signed)
Addended by: Haynes BastWORLEY, Robt Okuda J on: 03/27/2016 04:57 PM   Modules accepted: Orders

## 2016-03-28 ENCOUNTER — Ambulatory Visit: Payer: BLUE CROSS/BLUE SHIELD | Admitting: Physician Assistant

## 2016-03-30 ENCOUNTER — Encounter: Payer: Self-pay | Admitting: Physician Assistant

## 2016-04-03 ENCOUNTER — Encounter: Payer: Self-pay | Admitting: Physician Assistant

## 2016-04-04 ENCOUNTER — Ambulatory Visit: Payer: BLUE CROSS/BLUE SHIELD | Admitting: Physician Assistant

## 2016-04-04 DIAGNOSIS — Z0289 Encounter for other administrative examinations: Secondary | ICD-10-CM

## 2016-04-06 NOTE — Progress Notes (Signed)
Cardiology Office Note   Date:  04/07/2016   ID:  Savannah Roy, DOB 1960-07-19, MRN [id number]  PCP:  Jarold Motto, PA  Cardiologist:   Chilton Si, MD   Chief Complaint  Patient presents with  . New Patient (Initial Visit)  . Shortness of Breath    when exerting self.  . Chest Pain    sharp pain occasionally.  . Edema    in legs and feet frewuetly.      History of Present Illness: Savannah Roy is a 56 y.o. female with hypertension who presents for an evaluation of chest pain.  Savannah Roy reports 1 to 1.5 months chest pain and shortness of breath. The chest pain is occurred 3 or 4 times per month. It feels like a sharp, pulling sensation in her left chest. It hinders her from taking a deep breath that gets better after trying to take several deep breaths. There is no associated nausea or diaphoresis. It does not occur with exertion. She has awakened with this pain and it typically otherwise occurs if she is seated. She also reports episodes of exertional dyspnea. In the past she was able to hike or do other physically strenuous activities without any shortness of breath. However, lately she is unable to walk around the supermarket without stopping to rest. She has chronic lower extremity edema but denies orthopnea or PND.  Savannah Roy was evaluated by Jarold Motto, PA, on 03/24/16.  EKG was unremarkable. Her blood pressure was poorly-controlled and she was started on lisinopril/HCTZ.  BNP was 60.  Her TSH was mildly elevated but free T4 was within normal limits. She was referred to cardiology for further evaluation.  Savannah Roy was previously on blood pressure medication.  However she lost her insurance and didn't start back on medication until her appointment 03/2016.  She hasn't been exercising regularly but likes to walk when the weather is nice outside. She also does water aerobics 3 times per month. 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 She wonders if this could be contributing to her symptoms.   Past Medical History:  Diagnosis Date  . Hypertension   . Shortness of breath 04/07/2016    Past Surgical History:  Procedure Laterality Date  . ENDOMETRIAL ABLATION W/ NOVASURE  2007  . HERNIA REPAIR  1985  . KNEE ARTHROSCOPY  2005     Current Outpatient Prescriptions  Medication Sig Dispense Refill  . lisinopril-hydrochlorothiazide (PRINZIDE,ZESTORETIC) 20-25 MG tablet Take 1 tablet by mouth daily. 30 tablet 0  . amLODipine (NORVASC) 5 MG tablet Take 1 tablet (5 mg total) by mouth daily. 30 tablet 5   No current facility-administered medications for this visit.     Allergies:   Erythromycin    Social History:  The patient  reports that she has quit smoking. She has never used smokeless tobacco. She reports that she drinks alcohol. She reports that she does not use drugs.   Family History:  The patient's family history includes CVA in her mother; Cancer in her mother; Heart attack in her mother; Heart failure in her maternal grandmother; Kidney failure in her father; Thyroid disease in her mother and sister.    ROS:  Please see the history of present illness.   Otherwise, review of systems are positive for none.   All other systems are reviewed and negative.    PHYSICAL EXAM: VS:  BP (!) 178/93  Pulse 91   Ht 5\' 7"  (1.702 m)   Wt (!) 174.7 kg (385 lb 3.2 oz)   BMI 60.33 kg/m  , BMI Body mass index is 60.33 kg/m. GENERAL:  Well appearing HEENT:  Pupils equal round and reactive, fundi not visualized, oral mucosa unremarkable NECK:  No jugular venous distention, waveform within normal limits, carotid upstroke brisk and symmetric, no bruits, no thyromegaly LYMPHATICS:  No cervical adenopathy LUNGS:  Clear to auscultation bilaterally HEART:  RRR.  PMI not displaced or sustained,S1 and S2 within normal limits, no S3, no S4, no clicks, no rubs, II/VI early-peaking systolic  murmur at the RUSB ABD:  Flat, positive bowel sounds normal in frequency in pitch, no bruits, no rebound, no guarding, no midline pulsatile mass, no hepatomegaly, no splenomegaly EXT:  2 plus pulses throughout, no edema, no cyanosis no clubbing SKIN:  No rashes no nodules NEURO:  Cranial nerves II through XII grossly intact, motor grossly intact throughout PSYCH:  Cognitively intact, oriented to person place and time    EKG:  EKG is not ordered today. 03/24/16: Sinus rhythm. Rate 84 bpm.    Recent Labs: 03/24/2016: ALT 92; Brain Natriuretic Peptide 60.9; BUN 18; Creat 0.87; Hemoglobin 13.2; Magnesium 2.0; Platelets 142; Potassium 3.7; Sodium 142; TSH 4.97    Lipid Panel    Component Value Date/Time   CHOL 221 (A) 03/21/2012   TRIG 128 03/21/2012   HDL 31 (A) 03/21/2012   LDLCALC 164 03/21/2012      Wt Readings from Last 3 Encounters:  04/07/16 (!) 174.7 kg (385 lb 3.2 oz)  03/24/16 (!) 177.5 kg (391 lb 6.4 oz)  09/04/07 (!) 154.2 kg (340 lb)      ASSESSMENT AND PLAN:  # Atypical chest pain:  # Exertional dyspnea:  Savannah Roy's symptoms Are atypical. However, she does have risk factors for coronary artery disease and her dyspnea on exertion is concerning for an anginal equivalent. Her for a Lexiscan Myoview to better assess. We will await the report of her lipids before determining whether or not she needs aspirin 81 mg daily.  # Hypertension: Blood pressure is poorly-controlled. She will increase lisinopril/HCTZ to 20/25 mg daily. We will also add amlodipine 5 mg daily.  # Hyperlipidemia: Lipids pending with PCP.  # Murmur: Savannah Roy has a quiet, early-peaking systolic murmur concerning for mild aortic stenosis. We will obtain an echocardiogram to better evaluate.  # Swelling: Likely lymphedema.  There is no pitting.    Current medicines are reviewed at length with the patient today.  The patient does not have concerns regarding medicines.  The following changes  have been made:  Increase HCTZ/Lisinopril to 25/20 mg daily.  Add amlodipine 5 mg daily.   Labs/ tests ordered today include:   Orders Placed This Encounter  Procedures  . Myocardial Perfusion Imaging  . ECHOCARDIOGRAM COMPLETE     Disposition:   FU with Savannah Roy C. Duke Salviaandolph, MD, [facility] in 1 month    This note was written with the assistance of speech recognition software.  Please excuse any transcriptional errors.  Signed, Dazani Norby C. Duke Salviaandolph, MD, [facility]  04/07/2016 8:39 AM    [facility]

## 2016-04-07 ENCOUNTER — Encounter: Payer: Self-pay | Admitting: Cardiovascular Disease

## 2016-04-07 ENCOUNTER — Ambulatory Visit (INDEPENDENT_AMBULATORY_CARE_PROVIDER_SITE_OTHER): Payer: BLUE CROSS/BLUE SHIELD | Admitting: Cardiovascular Disease

## 2016-04-07 VITALS — BP 178/93 | HR 91 | Ht 67.0 in | Wt 385.2 lb

## 2016-04-07 DIAGNOSIS — I10 Essential (primary) hypertension: Secondary | ICD-10-CM

## 2016-04-07 DIAGNOSIS — R079 Chest pain, unspecified: Secondary | ICD-10-CM | POA: Diagnosis not present

## 2016-04-07 DIAGNOSIS — R0602 Shortness of breath: Secondary | ICD-10-CM

## 2016-04-07 DIAGNOSIS — R011 Cardiac murmur, unspecified: Secondary | ICD-10-CM

## 2016-04-07 HISTORY — DX: Shortness of breath: R06.02

## 2016-04-07 MED ORDER — AMLODIPINE BESYLATE 5 MG PO TABS: 5.0000 mg | ORAL_TABLET | Freq: Every day | ORAL | 5 refills | Status: DC

## 2016-04-07 NOTE — Patient Instructions (Addendum)
Medication Instructions:  START AMLODIPINE 5 MG DAILY  INCREASE YOUR LISINOPRIL HCTZ TO A FULL TABLET   Labwork: NONE  Testing/Procedures: Your physician has requested that you have a lexiscan myoview. For further information please visit [email or web address]. Please follow instruction sheet, as given. 2 DAY   Your physician has requested that you have an echocardiogram. Echocardiography is a painless test that uses sound waves to create images of your heart. It provides your doctor with information about the size and shape of your heart and how well your heart's chambers and valves are working. This procedure takes approximately one hour. There are no restrictions for this procedure. [facility] AT [address]  Follow-Up: Your physician recommends that you schedule a follow-up appointment in: 1 MONTH OV  If you need a refill on your cardiac medications before your next appointment, please call your pharmacy.

## 2016-04-10 ENCOUNTER — Telehealth: Payer: Self-pay | Admitting: Physician Assistant

## 2016-04-10 NOTE — Telephone Encounter (Signed)
Called pt back, see result notes.

## 2016-04-10 NOTE — Telephone Encounter (Signed)
Patient stated someone called her earlier, just trying to return call. Did not see any phone notes.

## 2016-04-13 ENCOUNTER — Telehealth (HOSPITAL_COMMUNITY): Payer: Self-pay

## 2016-04-13 NOTE — Telephone Encounter (Signed)
Encounter complete. 

## 2016-04-18 ENCOUNTER — Ambulatory Visit (HOSPITAL_COMMUNITY)
Admission: RE | Admit: 2016-04-18 | Discharge: 2016-04-18 | Disposition: A | Discharge status: Home / Self Care | Payer: BLUE CROSS/BLUE SHIELD | Source: Ambulatory Visit | Attending: Cardiovascular Disease | Admitting: Cardiovascular Disease

## 2016-04-18 DIAGNOSIS — R011 Cardiac murmur, unspecified: Secondary | ICD-10-CM | POA: Insufficient documentation

## 2016-04-18 DIAGNOSIS — R079 Chest pain, unspecified: Secondary | ICD-10-CM | POA: Diagnosis present

## 2016-04-18 DIAGNOSIS — R0602 Shortness of breath: Secondary | ICD-10-CM | POA: Diagnosis present

## 2016-04-18 MED ORDER — TECHNETIUM TC 99M TETROFOSMIN IV KIT
28.4000 | PACK | Freq: Once | INTRAVENOUS | Status: AC | PRN
  Administered 2016-04-18: 28.4 via INTRAVENOUS
  Filled 2016-04-18: qty 29

## 2016-04-18 MED ORDER — REGADENOSON 0.4 MG/5ML IV SOLN
0.4000 mg | Freq: Once | INTRAVENOUS | Status: AC
  Administered 2016-04-18: 0.4 mg via INTRAVENOUS

## 2016-04-19 ENCOUNTER — Ambulatory Visit (HOSPITAL_COMMUNITY)
Admission: RE | Admit: 2016-04-19 | Discharge: 2016-04-19 | Disposition: A | Discharge status: Home / Self Care | Payer: BLUE CROSS/BLUE SHIELD | Source: Ambulatory Visit | Attending: Cardiovascular Disease | Admitting: Cardiovascular Disease

## 2016-04-19 LAB — MYOCARDIAL PERFUSION IMAGING
CHL CUP NUCLEAR SRS: 0
LV sys vol: 59 mL
LVDIAVOL: 115 mL (ref 46–106)
Peak HR: 88 {beats}/min
Rest HR: 86 {beats}/min
SDS: 2
SSS: 2
TID: 0.98

## 2016-04-19 MED ORDER — TECHNETIUM TC 99M TETROFOSMIN IV KIT
28.6000 | PACK | Freq: Once | INTRAVENOUS | Status: AC | PRN
  Administered 2016-04-19: 28.6 via INTRAVENOUS

## 2016-04-25 ENCOUNTER — Other Ambulatory Visit (HOSPITAL_COMMUNITY): Payer: BLUE CROSS/BLUE SHIELD

## 2016-05-09 ENCOUNTER — Ambulatory Visit (HOSPITAL_COMMUNITY): Payer: BLUE CROSS/BLUE SHIELD | Attending: Cardiology

## 2016-05-09 ENCOUNTER — Other Ambulatory Visit: Payer: Self-pay

## 2016-05-09 DIAGNOSIS — R011 Cardiac murmur, unspecified: Secondary | ICD-10-CM | POA: Diagnosis present

## 2016-05-09 DIAGNOSIS — I348 Other nonrheumatic mitral valve disorders: Secondary | ICD-10-CM | POA: Insufficient documentation

## 2016-05-09 DIAGNOSIS — R0602 Shortness of breath: Secondary | ICD-10-CM

## 2016-05-09 DIAGNOSIS — R079 Chest pain, unspecified: Secondary | ICD-10-CM | POA: Insufficient documentation

## 2016-05-09 MED ORDER — PERFLUTREN LIPID MICROSPHERE
1.0000 mL | INTRAVENOUS | Status: AC | PRN
  Administered 2016-05-09: 2 mL via INTRAVENOUS

## 2016-05-09 NOTE — Progress Notes (Signed)
Cardiology Office Note   Date:  05/10/2016   ID:  Savannah Roy, DOB September 21, 1960, MRN [id number]  PCP:  Savannah Motto, PA  Cardiologist:   Chilton Si, MD   Chief Complaint  Patient presents with  . Follow-up    1 month;  Marland Kitchen Shortness of Breath    has decrease, but still occuring.       History of Present Illness: Savannah Roy is a 56 y.o. female with hypertension and morbid obesity who presents for follow-up.  She was initially seen 03/2016 for an evaluation of chest pain.  Ms. Cassels reported 1 to 1.5 months chest pain and shortness of breath.  She was referred for [facility] 04/19/16 that revealed LVEF 49% with no ischemia and likely LVH. She was also referred for an echocardiogram that revealed LVEF 65-70% with moderate LVH and grade 1 diastolic dysfunction.  At her last appointment her blood pressure was poorly-controlled so amlodipine 5 mg daily was added to her regimen.  Since that appointment her BP has been good at home.  It was in the 110s-120s systolic.  She ran out of HCTZ-lisinopril one week ago.  She has also been very stressed at work.  However this Friday will be her last day.  She has been otherwise feeling well but does continue to have some chest discomfort from time to time. It typically does not occur with exertion.    Past Medical History:  Diagnosis Date  . Hypertension   . LVH (left ventricular hypertrophy) due to hypertensive disease 05/10/2016  . Shortness of breath 04/07/2016    Past Surgical History:  Procedure Laterality Date  . ENDOMETRIAL ABLATION W/ NOVASURE  2007  . HERNIA REPAIR  1985  . KNEE ARTHROSCOPY  2005     Current Outpatient Prescriptions  Medication Sig Dispense Refill  . amLODipine (NORVASC) 5 MG tablet Take 1 tablet (5 mg total) by mouth daily. 30 tablet 5  . lisinopril-hydrochlorothiazide (PRINZIDE,ZESTORETIC) 20-25 MG tablet Take 1 tablet by mouth daily. 90 tablet 1   No current facility-administered  medications for this visit.     Allergies:   Erythromycin    Social History:  The patient  reports that she has quit smoking. She has never used smokeless tobacco. She reports that she drinks alcohol. She reports that she does not use drugs.   Family History:  The patient's family history includes CVA in her mother; Cancer in her mother; Heart attack in her mother; Heart failure in her maternal grandmother; Kidney failure in her father; Thyroid disease in her mother and sister.    ROS:  Please see the history of present illness.   Otherwise, review of systems are positive for none.   All other systems are reviewed and negative.    PHYSICAL EXAM: VS:  BP (!) 192/85   Pulse 100   Ht 5\' 7"  (1.702 m)   Wt (!) 173.8 kg (383 lb 3.2 oz)   BMI 60.02 kg/m  , BMI Body mass index is 60.02 kg/m. GENERAL:  Well appearing HEENT:  Pupils equal round and reactive, fundi not visualized, oral mucosa unremarkable NECK:  No jugular venous distention, waveform within normal limits, carotid upstroke brisk and symmetric, no bruits LYMPHATICS:  No cervical adenopathy LUNGS:  Clear to auscultation bilaterally HEART:  RRR.  PMI not displaced or sustained,S1 and S2 within normal limits, no S3, no S4, no clicks, no rubs, II/VI early-peaking systolic murmur at the RUSB ABD:  Flat, positive bowel  sounds normal in frequency in pitch, no bruits, no rebound, no guarding, no midline pulsatile mass, no hepatomegaly, no splenomegaly EXT:  2 plus pulses throughout, no edema, no cyanosis no clubbing SKIN:  No rashes no nodules NEURO:  Cranial nerves II through XII grossly intact, motor grossly intact throughout PSYCH:  Cognitively intact, oriented to person place and time   EKG:  EKG is not ordered today. 03/24/16: Sinus rhythm. Rate 84 bpm.  Lexiscan Myoview 04/19/16:   The left ventricular ejection fraction is mildly decreased (45-54%).  Nuclear stress EF: 49%.  There was no ST segment deviation noted during  stress.  This is a low risk study.   Normal resting and stress perfusion. No ischemia or infarction EF EF estimated at 49% with significant appearing LVH suggest Echo correlation   Echo 05/09/16: Study Conclusions  - Left ventricle: The cavity size was normal. Wall thickness was   increased in a pattern of moderate LVH. Systolic function was   vigorous. The estimated ejection fraction was in the range of 65%   to 70%. Wall motion was normal; there were no regional wall   motion abnormalities. Doppler parameters are consistent with   abnormal left ventricular relaxation (grade 1 diastolic   dysfunction). - Mitral valve: Calcified annulus.  Impressions:  - Technically difficult; definity used; vigorous LV systolic   function; moderate LVH; grade 1 diastolic dysfunction.   Recent Labs: 03/24/2016: ALT 92; Brain Natriuretic Peptide 60.9; BUN 18; Creat 0.87; Hemoglobin 13.2; Magnesium 2.0; Platelets 142; Potassium 3.7; Sodium 142; TSH 4.97    Lipid Panel    Component Value Date/Time   CHOL 221 (A) 03/21/2012   TRIG 128 03/21/2012   HDL 31 (A) 03/21/2012   LDLCALC 164 03/21/2012      Wt Readings from Last 3 Encounters:  05/10/16 (!) 173.8 kg (383 lb 3.2 oz)  04/18/16 (!) 174.6 kg (385 lb)  04/07/16 (!) 174.7 kg (385 lb 3.2 oz)      ASSESSMENT AND PLAN:  # Atypical chest pain:  # Exertional dyspnea:  [name]'s symptoms are atypical. Lexiscan Myoview was negative for ischemia.  I suspect this is due to poorly-controlled hypertension and stress.  # Hypertension: Blood pressure is poorly-controlled.  She has been out of HCTZ/lisinopril for the last week. We will refill this today and she will continue amlodipine. I've asked her to keep a log her blood pressures to return in one month to reassess  # Hyperlipidemia: We will get a copy of her liquids from her PCP  # Swelling: Likely lymphedema.  There is no pitting.    Current medicines are reviewed at length with  the patient today.  The patient does not have concerns regarding medicines.  The following changes have been made:  none  Labs/ tests ordered today include:   No orders of the defined types were placed in this encounter.    Disposition:   FU with Abia Monaco C. Duke Salviaandolph, MD, [facility] in 1 month    This note was written with the assistance of speech recognition software.  Please excuse any transcriptional errors.  Signed, Hisako Bugh C. Duke Salviaandolph, MD, [facility]  05/10/2016 8:16 AM    [facility]

## 2016-05-10 ENCOUNTER — Ambulatory Visit (INDEPENDENT_AMBULATORY_CARE_PROVIDER_SITE_OTHER): Payer: BLUE CROSS/BLUE SHIELD | Admitting: Cardiovascular Disease

## 2016-05-10 ENCOUNTER — Encounter: Payer: Self-pay | Admitting: Cardiovascular Disease

## 2016-05-10 DIAGNOSIS — I10 Essential (primary) hypertension: Secondary | ICD-10-CM | POA: Diagnosis not present

## 2016-05-10 DIAGNOSIS — I119 Hypertensive heart disease without heart failure: Secondary | ICD-10-CM

## 2016-05-10 HISTORY — DX: Hypertensive heart disease without heart failure: I11.9

## 2016-05-10 MED ORDER — LISINOPRIL-HYDROCHLOROTHIAZIDE 20-25 MG PO TABS: 1.0000 | ORAL_TABLET | Freq: Every day | ORAL | 1 refills | Status: DC

## 2016-05-10 NOTE — Patient Instructions (Signed)
Medication Instructions:  Your physician recommends that you continue on your current medications as directed. Please refer to the Current Medication list given to you today.  Labwork: none  Testing/Procedures: none  Follow-Up: Your physician recommends that you schedule a follow-up appointment in: 1 month ov  If you need a refill on your cardiac medications before your next appointment, please call your pharmacy.  

## 2016-05-15 ENCOUNTER — Telehealth: Payer: Self-pay | Admitting: *Deleted

## 2016-05-15 NOTE — Progress Notes (Signed)
Please call patient and see if we can put her on our schedule. We need to have her lipids drawn, and it would be good to check in to see how she's been doing. At the least, we need her lipids checked because the cardiologist would like to review them.

## 2016-05-15 NOTE — Telephone Encounter (Signed)
Left message on voicemail to call office. Pt needs to schedule an appt with Samantha.  

## 2016-05-15 NOTE — Telephone Encounter (Signed)
-----   Message from [place], [place] sent at 05/15/2016  7:55 AM EDT -----   ----- Message ----- From: Chilton Si, MD Sent: 05/10/2016   8:16 AM To: Jarold Motto, PA

## 2016-05-15 NOTE — Telephone Encounter (Signed)
Pt called back and was scheduled for Wed 4/4 with Samantha.

## 2016-05-17 ENCOUNTER — Encounter: Payer: Self-pay | Admitting: Physician Assistant

## 2016-05-17 ENCOUNTER — Ambulatory Visit (INDEPENDENT_AMBULATORY_CARE_PROVIDER_SITE_OTHER): Payer: BLUE CROSS/BLUE SHIELD | Admitting: Physician Assistant

## 2016-05-17 VITALS — BP 136/84 | HR 94 | Temp 99.2°F | Ht 67.0 in | Wt 376.2 lb

## 2016-05-17 DIAGNOSIS — Z114 Encounter for screening for human immunodeficiency virus [HIV]: Secondary | ICD-10-CM | POA: Diagnosis not present

## 2016-05-17 DIAGNOSIS — I10 Essential (primary) hypertension: Secondary | ICD-10-CM

## 2016-05-17 DIAGNOSIS — F418 Other specified anxiety disorders: Secondary | ICD-10-CM | POA: Diagnosis not present

## 2016-05-17 LAB — LIPID PANEL
CHOL/HDL RATIO: 8
Cholesterol: 226 mg/dL — ABNORMAL HIGH (ref 0–200)
HDL: 29.3 mg/dL — AB (ref >39.00)
LDL CALC: 159 mg/dL — AB (ref 0–99)
NonHDL: 196.22
TRIGLYCERIDES: 185 mg/dL — AB (ref 0.0–149.0)
VLDL: 37 mg/dL (ref 0.0–40.0)

## 2016-05-17 NOTE — Progress Notes (Signed)
Pre visit review using our clinic review tool, if applicable. No additional management support is needed unless otherwise documented below in the visit note. 

## 2016-05-17 NOTE — Assessment & Plan Note (Signed)
Currently well controlled on  Norvasc and 20-25mg  Prinzide. Compliant with cardiology appointments. Will order lipids today and send to cardiology.

## 2016-05-17 NOTE — Progress Notes (Signed)
Savannah Roy is a 56 y.o. female is here to discuss:   History of Present Illness:   Chief Complaint  Patient presents with  . Follow-up  . Hypertension   HPI  Patient's blood pressure is very well controlled presently. She is taking her Norvasc  and Lisinopril-HCTZ 20-25mg  as scheduled and her BP today in our office is 136/84. She had a stress test that showed an EF of 49% and LVH. She has an echo scheduled. She continues to have SOB but feels like overall it has slightly improved. She denies any lower leg swelling, chest discomfort. She was recently let go from her job but does state that her job was extremely stressful. She is very tearful today and has significant situational anxiety due to her job loss. She states that she does not have a strong support system, just her son who "has his own health problems."   Health Maintenance Due  Topic Date Due  . HIV Screening  02/03/1974  . MAMMOGRAM  02/03/2009  . COLONOSCOPY  02/03/2009  . PAP SMEAR  03/22/2015    PMHx, SurgHx, SocialHx, FamHx, Medications, and Allergies were reviewed in the Visit Navigator and updated as appropriate.   Patient Active Problem List   Diagnosis Date Noted  . LVH (left ventricular hypertrophy) due to hypertensive disease 05/10/2016  . Shortness of breath 04/07/2016  . Chest pain 03/24/2016  . SEBACEOUS CYST, BREAST 08/14/2007  . CELLULITIS 11/06/2006  . BMI 60.0-69.9, adult (HCC) 05/16/2006  . Essential hypertension 05/16/2006  . OLIGOMENORRHEA 05/16/2006    Social History  Substance Use Topics  . Smoking status: Former Games developer  . Smokeless tobacco: Never Used  . Alcohol use Yes     Comment: occasionally    Current Medications and Allergies:    Current Outpatient Prescriptions:  .  acetaminophen (TYLENOL) 325 MG tablet, Take 650 mg by mouth as needed., Disp: , Rfl:  .  amLODipine (NORVASC) 5 MG tablet, Take 1 tablet (5 mg total) by mouth daily., Disp: 30 tablet, Rfl: 5 .   lisinopril-hydrochlorothiazide (PRINZIDE,ZESTORETIC) 20-25 MG tablet, Take 1 tablet by mouth daily., Disp: 90 tablet, Rfl: 1   Allergies  Allergen Reactions  . Erythromycin     REACTION: rash    Review of Systems   Review of Systems  Constitutional: Negative.   Eyes: Negative.   Respiratory: Positive for shortness of breath.   Cardiovascular: Negative for chest pain, claudication and leg swelling.  Neurological: Negative.   Psychiatric/Behavioral: Negative for depression. The patient is nervous/anxious.     Vitals:   Vitals:   05/17/16 0802  BP: 136/84  Pulse: 94  Temp: 99.2 F (37.3 C)  TempSrc: Oral  SpO2: 96%  Weight: (!) 376 lb 4 oz (170.7 kg)  Height:  (1.702 m)     Body mass index is 58.93 kg/m.   Physical Exam:    Physical Exam  Constitutional: She appears well-developed. She is cooperative.  Non-toxic appearance. She does not have a sickly appearance. She does not appear ill. No distress.  Cardiovascular: Normal rate, regular rhythm, S1 normal, S2 normal and normal pulses.   Murmur heard.  Systolic murmur is present  Pulmonary/Chest: Effort normal and breath sounds normal.  Neurological: She is alert.  Psychiatric: Her mood appears anxious.  Tearful throughout encounter  Nursing note and vitals reviewed.     Assessment and Plan:    Problem List Items Addressed This Visit      Cardiovascular and Mediastinum  Essential hypertension - Primary    Currently well controlled on  Norvasc and 20-25mg  Prinzide. Compliant with cardiology appointments. Will order lipids today and send to cardiology.      Relevant Orders   Lipid panel    Other Visit Diagnoses    Encounter for screening for HIV   She is agreeable to one time HIV screening.     Relevant Orders   HIV antibody   Situational anxiety     She is very anxious and tearful about being let go from her job, however she does report that she was significantly stressed at that job. She  denies any medication. She lives with her son and we discussed having open communication with him so she has a stronger support system. She is to let us know if she needs anything. She feels very overwhelmed with her situation and does not want to discuss further health maintenance at today's visit. I recommended that she follow-up with [place] in about 1 month if she has insurance to schedule a physical.       . Reviewed expectations re: course of current medical issues. . Discussed self-management of symptoms. . Outlined signs and symptoms indicating need for more acute intervention. . Patient verbalized understanding and all questions were answered. . See orders for this visit as documented in the electronic medical record. . Patient received an After Visit Summary.   Jarold Motto, PA-C [place] [place] 05/17/2016  Follow-up: No Follow-up on file.

## 2016-05-17 NOTE — Patient Instructions (Signed)
It was great seeing you today!  Please let us know if there is anything that we can do for you.  Please schedule a physical with [place] if you continue to have health insurance.

## 2016-05-18 ENCOUNTER — Other Ambulatory Visit: Payer: Self-pay | Admitting: Physician Assistant

## 2016-05-18 LAB — HIV ANTIBODY (ROUTINE TESTING W REFLEX): HIV 1&2 Ab, 4th Generation: NONREACTIVE

## 2016-05-18 MED ORDER — ROSUVASTATIN CALCIUM 20 MG PO TABS: 20.0000 mg | ORAL_TABLET | Freq: Every day | ORAL | 0 refills | Status: DC

## 2016-06-07 ENCOUNTER — Ambulatory Visit: Payer: BLUE CROSS/BLUE SHIELD | Admitting: Cardiovascular Disease

## 2016-06-07 NOTE — Progress Notes (Deleted)
Cardiology Office Note   Date:  06/07/2016   ID:  ITXEL Savannah Roy, DOB 1960/12/07, MRN [id number]  PCP:  Savannah Motto, PA  Cardiologist:   Savannah Si, MD   No chief complaint on file.     History of Present Illness: Savannah Roy is a 56 y.o. female with hypertension and morbid obesity who presents for follow-up.  She was initially seen 03/2016 for an evaluation of chest pain.  Ms. Savannah Roy reported 1 to 1.5 months chest pain and shortness of breath.  She was referred for [facility] 04/19/16 that revealed LVEF 49% with no ischemia and likely LVH. She was also referred for an echocardiogram that revealed LVEF 65-70% with moderate LVH and grade 1 diastolic dysfunction. Her blood pressure has been poorly-controlled so amlodipine 5 mg daily was added to her regimen.  However at follow up she had not been taking HCTZ-lisinopril at her last appointment because the prescription ran out.  After her last appointment lipids were checked and her LDL was 156.  Diet and exercise were recommended and she was started on rosuvastatin 20 mg daily.    Past Medical History:  Diagnosis Date  . LVH (left ventricular hypertrophy) due to hypertensive disease 05/10/2016  . Shortness of breath 04/07/2016    Past Surgical History:  Procedure Laterality Date  . ENDOMETRIAL ABLATION W/ NOVASURE  2007  . HERNIA REPAIR  1985  . KNEE ARTHROSCOPY  2005     Current Outpatient Prescriptions  Medication Sig Dispense Refill  . acetaminophen (TYLENOL) 325 MG tablet Take 650 mg by mouth as needed.    Marland Kitchen amLODipine (NORVASC) 5 MG tablet Take 1 tablet (5 mg total) by mouth daily. 30 tablet 5  . lisinopril-hydrochlorothiazide (PRINZIDE,ZESTORETIC) 20-25 MG tablet Take 1 tablet by mouth daily. 90 tablet 1  . rosuvastatin (CRESTOR) 20 MG tablet Take 1 tablet (20 mg total) by mouth daily. 90 tablet 0   No current facility-administered medications for this visit.     Allergies:   Erythromycin     Social History:  The patient  reports that she has quit smoking. She has never used smokeless tobacco. She reports that she drinks alcohol. She reports that she does not use drugs.   Family History:  The patient's family history includes CVA in her mother; Cancer in her mother; Heart attack in her mother; Heart failure in her maternal grandmother; Kidney failure in her father; Thyroid disease in her mother and sister.    ROS:  Please see the history of present illness.   Otherwise, review of systems are positive for none.   All other systems are reviewed and negative.    PHYSICAL EXAM: VS:  There were no vitals taken for this visit. , BMI There is no height or weight on file to calculate BMI. GENERAL:  Well appearing HEENT:  Pupils equal round and reactive, fundi not visualized, oral mucosa unremarkable NECK:  No jugular venous distention, waveform within normal limits, carotid upstroke brisk and symmetric, no bruits LYMPHATICS:  No cervical adenopathy LUNGS:  Clear to auscultation bilaterally HEART:  RRR.  PMI not displaced or sustained,S1 and S2 within normal limits, no S3, no S4, no clicks, no rubs, II/VI early-peaking systolic murmur at the RUSB ABD:  Flat, positive bowel sounds normal in frequency in pitch, no bruits, no rebound, no guarding, no midline pulsatile mass, no hepatomegaly, no splenomegaly EXT:  2 plus pulses throughout, no edema, no cyanosis no clubbing SKIN:  No rashes no  nodules NEURO:  Cranial nerves II through XII grossly intact, motor grossly intact throughout PSYCH:  Cognitively intact, oriented to person place and time   EKG:  EKG is not ordered today. 03/24/16: Sinus rhythm. Rate 84 bpm.  Lexiscan Myoview 04/19/16:   The left ventricular ejection fraction is mildly decreased (45-54%).  Nuclear stress EF: 49%.  There was no ST segment deviation noted during stress.  This is a low risk study.   Normal resting and stress perfusion. No ischemia or  infarction EF EF estimated at 49% with significant appearing LVH suggest Echo correlation   Echo 05/09/16: Study Conclusions  - Left ventricle: The cavity size was normal. Wall thickness was   increased in a pattern of moderate LVH. Systolic function was   vigorous. The estimated ejection fraction was in the range of 65%   to 70%. Wall motion was normal; there were no regional wall   motion abnormalities. Doppler parameters are consistent with   abnormal left ventricular relaxation (grade 1 diastolic   dysfunction). - Mitral valve: Calcified annulus.  Impressions:  - Technically difficult; definity used; vigorous LV systolic   function; moderate LVH; grade 1 diastolic dysfunction.   Recent Labs: 03/24/2016: ALT 92; Brain Natriuretic Peptide 60.9; BUN 18; Creat 0.87; Hemoglobin 13.2; Magnesium 2.0; Platelets 142; Potassium 3.7; Sodium 142; TSH 4.97    Lipid Panel    Component Value Date/Time   CHOL 226 (H) 05/17/2016 0823   TRIG 185.0 (H) 05/17/2016 0823   HDL 29.30 (L) 05/17/2016 0823   CHOLHDL 8 05/17/2016 0823   VLDL 37.0 05/17/2016 0823   LDLCALC 159 (H) 05/17/2016 0823      Wt Readings from Last 3 Encounters:  05/17/16 (!) 170.7 kg (376 lb 4 oz)  05/10/16 (!) 173.8 kg (383 lb 3.2 oz)  04/18/16 (!) 174.6 kg (385 lb)      ASSESSMENT AND PLAN:  # Atypical chest pain:  # Exertional dyspnea:  Savannah Roy symptoms are atypical. Lexiscan Myoview was negative for ischemia.  I suspect this is due to poorly-controlled hypertension and stress.  # Hypertension: Blood pressure is poorly-controlled.  She has been out of HCTZ/lisinopril for the last week. We will refill this today and she will continue amlodipine. I've asked her to keep a log her blood pressures to return in one month to reassess  # Hyperlipidemia: We will get a copy of her liquids from her PCP  # Swelling: Likely lymphedema.  There is no pitting.    Current medicines are reviewed at length with the  patient today.  The patient does not have concerns regarding medicines.  The following changes have been made:  none  Labs/ tests ordered today include:   No orders of the defined types were placed in this encounter.    Disposition:   FU with Savannah Roy. Duke Salvia, MD, [facility] in 1 month    This note was written with the assistance of speech recognition software.  Please excuse any transcriptional errors.  Signed, Tippi Mccrae Roy. Duke Salvia, MD, [facility]  06/07/2016 5:58 AM    [facility]

## 2016-06-08 ENCOUNTER — Encounter: Payer: Self-pay | Admitting: *Deleted

## 2016-08-17 ENCOUNTER — Other Ambulatory Visit: Payer: Self-pay | Admitting: *Deleted

## 2016-08-17 MED ORDER — ROSUVASTATIN CALCIUM 20 MG PO TABS: 20.0000 mg | ORAL_TABLET | Freq: Every day | ORAL | 0 refills | Status: DC

## 2016-11-20 ENCOUNTER — Ambulatory Visit (INDEPENDENT_AMBULATORY_CARE_PROVIDER_SITE_OTHER): Payer: BLUE CROSS/BLUE SHIELD | Admitting: Physician Assistant

## 2016-11-20 ENCOUNTER — Emergency Department (HOSPITAL_COMMUNITY)
Admission: EM | Admit: 2016-11-20 | Discharge: 2016-11-21 | Disposition: A | Discharge status: Other Acute Inpatient Hospital | Payer: BLUE CROSS/BLUE SHIELD | Attending: Emergency Medicine | Admitting: Emergency Medicine

## 2016-11-20 ENCOUNTER — Encounter (HOSPITAL_COMMUNITY): Payer: Self-pay | Admitting: Emergency Medicine

## 2016-11-20 ENCOUNTER — Encounter: Payer: Self-pay | Admitting: Physician Assistant

## 2016-11-20 VITALS — BP 132/80 | HR 104 | Temp 99.6°F | Ht 67.0 in | Wt 348.4 lb

## 2016-11-20 DIAGNOSIS — Z046 Encounter for general psychiatric examination, requested by authority: Secondary | ICD-10-CM | POA: Diagnosis not present

## 2016-11-20 DIAGNOSIS — Z9114 Patient's other noncompliance with medication regimen: Secondary | ICD-10-CM | POA: Diagnosis not present

## 2016-11-20 DIAGNOSIS — Z79899 Other long term (current) drug therapy: Secondary | ICD-10-CM | POA: Diagnosis not present

## 2016-11-20 DIAGNOSIS — R45851 Suicidal ideations: Secondary | ICD-10-CM | POA: Diagnosis not present

## 2016-11-20 DIAGNOSIS — Z87891 Personal history of nicotine dependence: Secondary | ICD-10-CM | POA: Diagnosis not present

## 2016-11-20 DIAGNOSIS — F419 Anxiety disorder, unspecified: Secondary | ICD-10-CM | POA: Diagnosis not present

## 2016-11-20 DIAGNOSIS — F322 Major depressive disorder, single episode, severe without psychotic features: Secondary | ICD-10-CM | POA: Diagnosis not present

## 2016-11-20 DIAGNOSIS — R1012 Left upper quadrant pain: Secondary | ICD-10-CM | POA: Diagnosis not present

## 2016-11-20 DIAGNOSIS — I10 Essential (primary) hypertension: Secondary | ICD-10-CM | POA: Diagnosis not present

## 2016-11-20 DIAGNOSIS — F329 Major depressive disorder, single episode, unspecified: Secondary | ICD-10-CM | POA: Insufficient documentation

## 2016-11-20 LAB — CBC
HCT: 47.2 % — ABNORMAL HIGH (ref 36.0–46.0)
Hemoglobin: 15.7 g/dL — ABNORMAL HIGH (ref 12.0–15.0)
MCH: 29.9 pg (ref 26.0–34.0)
MCHC: 33.3 g/dL (ref 30.0–36.0)
MCV: 89.9 fL (ref 78.0–100.0)
PLATELETS: 175 10*3/uL (ref 150–400)
RBC: 5.25 MIL/uL — AB (ref 3.87–5.11)
RDW: 14.2 % (ref 11.5–15.5)
WBC: 4.3 10*3/uL (ref 4.0–10.5)

## 2016-11-20 LAB — COMPREHENSIVE METABOLIC PANEL
ALK PHOS: 77 U/L (ref 38–126)
ALT: 60 U/L — AB (ref 14–54)
AST: 67 U/L — ABNORMAL HIGH (ref 15–41)
Albumin: 4 g/dL (ref 3.5–5.0)
Anion gap: 11 (ref 5–15)
BUN: 16 mg/dL (ref 6–20)
CALCIUM: 9.4 mg/dL (ref 8.9–10.3)
CHLORIDE: 106 mmol/L (ref 101–111)
CO2: 23 mmol/L (ref 22–32)
CREATININE: 1.11 mg/dL — AB (ref 0.44–1.00)
GFR calc non Af Amer: 54 mL/min — ABNORMAL LOW (ref >60)
GLUCOSE: 103 mg/dL — AB (ref 65–99)
Potassium: 3.5 mmol/L (ref 3.5–5.1)
SODIUM: 140 mmol/L (ref 135–145)
Total Bilirubin: 2 mg/dL — ABNORMAL HIGH (ref 0.3–1.2)
Total Protein: 7.3 g/dL (ref 6.5–8.1)

## 2016-11-20 LAB — SALICYLATE LEVEL

## 2016-11-20 LAB — ETHANOL: Alcohol, Ethyl (B): <10 mg/dL (ref <10)

## 2016-11-20 LAB — ACETAMINOPHEN LEVEL

## 2016-11-20 MED ORDER — GI COCKTAIL ~~LOC~~: 30.0000 mL | Freq: Once | ORAL | Status: DC

## 2016-11-20 MED ORDER — AMLODIPINE BESYLATE 5 MG PO TABS
5.0000 mg | ORAL_TABLET | Freq: Every day | ORAL | Status: DC
  Administered 2016-11-20 – 2016-11-21 (×2): 5 mg via ORAL
  Filled 2016-11-20 (×2): qty 1

## 2016-11-20 MED ORDER — HYDROCHLOROTHIAZIDE 25 MG PO TABS
25.0000 mg | ORAL_TABLET | Freq: Every day | ORAL | Status: DC
  Administered 2016-11-20 – 2016-11-21 (×2): 25 mg via ORAL
  Filled 2016-11-20 (×2): qty 1

## 2016-11-20 MED ORDER — LISINOPRIL 20 MG PO TABS
20.0000 mg | ORAL_TABLET | Freq: Every day | ORAL | Status: DC
  Administered 2016-11-20 – 2016-11-21 (×2): 20 mg via ORAL
  Filled 2016-11-20 (×2): qty 1

## 2016-11-20 MED ORDER — ACETAMINOPHEN 325 MG PO TABS: 650.0000 mg | ORAL_TABLET | ORAL | Status: DC | PRN

## 2016-11-20 MED ORDER — ROSUVASTATIN CALCIUM 20 MG PO TABS
20.0000 mg | ORAL_TABLET | Freq: Every day | ORAL | Status: DC
  Administered 2016-11-20: 20 mg via ORAL
  Filled 2016-11-20: qty 1

## 2016-11-20 MED ORDER — LISINOPRIL-HYDROCHLOROTHIAZIDE 20-25 MG PO TABS: 1.0000 | ORAL_TABLET | Freq: Every day | ORAL | Status: DC

## 2016-11-20 NOTE — ED Notes (Signed)
ED Provider at bedside. 

## 2016-11-20 NOTE — ED Notes (Signed)
Patient changed into paper scrubs. 

## 2016-11-20 NOTE — ED Notes (Signed)
Savannah Roy is a 56 year old female admitted to psych ED after reporting SI during primary care provider visit earlier today. Patient appears tearful, saddened, and reports hopelessness at time of assessment. 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 Patient also reports that her unemployment ran out and has no income at this time. Endorses SI, states "I had thought about a gun but I don't have one". Denies HI/AV hallucinations. Endorses isolative behaviors, staying in her bedroom and sleeping. Patient identifies her 48 year old son whom lives with her as her support system. Reports no history of abuse and denies fall history. Encouragement and support provided. 15 minute checks in place for safety, surveillance present for safety.

## 2016-11-20 NOTE — ED Provider Notes (Signed)
[facility]-EMERGENCY DEPT Provider Note   CSN: [id number] Arrival date & time: 11/20/16  1522     History   Chief Complaint Chief Complaint  Patient presents with  . Suicidal    HPI Savannah Roy is a 56 y.o. female With history of LVH, HTN, obesity who presents a with chief complaint acute onset, progressively worsening suicidal ideations for 2 months. She states that she has been under a lot of stress lately and has lost her job, is not collecting unemployment, and has not been able to afford her medications for the past several weeks. She endorses suicidal ideations with plan to "initially I thought I would use a gun, but I don't have access to one. Then I started thinking it use my car". Denies homicidal ideation or auditory or visual hallucinations. No prior suicide attempts. Denies intentional overdose or ingestion of medications. Denies alcohol or recreational drug use. States that she has not left her apartment in about 2 months. She endorses intermittent gnawing left upper quadrant pain which radiates to the epigastric region after meals. She states pain is transient in nature, improves with time. History Tylenol with moderate improvement. Has not tried anything else for his symptoms. Denies any other medical complaints at this time.  The history is provided by the patient.    Past Medical History:  Diagnosis Date  . LVH (left ventricular hypertrophy) due to hypertensive disease 05/10/2016  . Shortness of breath 04/07/2016    Patient Active Problem List   Diagnosis Date Noted  . LVH (left ventricular hypertrophy) due to hypertensive disease 05/10/2016  . Shortness of breath 04/07/2016  . Chest pain 03/24/2016  . SEBACEOUS CYST, BREAST 08/14/2007  . CELLULITIS 11/06/2006  . BMI 60.0-69.9, adult (HCC) 05/16/2006  . Essential hypertension 05/16/2006  . OLIGOMENORRHEA 05/16/2006    Past Surgical History:  Procedure Laterality Date  . ENDOMETRIAL ABLATION W/ NOVASURE  2007  .  HERNIA REPAIR  1985  . KNEE ARTHROSCOPY  2005    OB History    No data available       Home Medications    Prior to Admission medications   Medication Sig Start Date End Date Taking? Authorizing Provider  acetaminophen (TYLENOL) 325 MG tablet Take 650 mg by mouth as needed for moderate pain.    Yes [provider]  amLODipine (NORVASC) 5 MG tablet Take 1 tablet (5 mg total) by mouth daily. 04/07/16 11/20/16 Yes Chilton Si, MD  lisinopril-hydrochlorothiazide (PRINZIDE,ZESTORETIC) 20-25 MG tablet Take 1 tablet by mouth daily. 05/10/16  Yes Chilton Si, MD  rosuvastatin (CRESTOR) 20 MG tablet Take 1 tablet (20 mg total) by mouth daily. 08/17/16  Yes Jarold Motto, PA    Family History Family History  Problem Relation Age of Onset  . Cancer Mother   . Heart attack Mother   . CVA Mother   . Thyroid disease Mother   . Kidney failure Father   . Thyroid disease Sister   . Heart failure Maternal Grandmother     Social History Social History  Substance Use Topics  . Smoking status: Former Games developer  . Smokeless tobacco: Never Used  . Alcohol use Yes     Comment: occasionally     Allergies   Erythromycin   Review of Systems Review of Systems  Constitutional: Negative for chills and fever.  Respiratory: Negative for shortness of breath.   Cardiovascular: Negative for chest pain.  Gastrointestinal: Positive for abdominal pain. Negative for nausea and vomiting.  Psychiatric/Behavioral: Positive for  dysphoric mood, sleep disturbance and suicidal ideas. The patient is nervous/anxious.   All other systems reviewed and are negative.    Physical Exam Updated Vital Signs BP (!) 167/67 (BP Location: Left Arm)   Pulse 76   Temp 97.9 F (36.6 C) (Oral)   Resp 20   Ht  (1.727 m)   Wt 112.5 kg (248 lb)   SpO2 96%   BMI 37.71 kg/m   Physical Exam  Constitutional: She appears well-developed and well-nourished. No distress.  HENT:  Head:  Normocephalic and atraumatic.  Eyes: Pupils are equal, round, and reactive to light. Conjunctivae and EOM are normal. Right eye exhibits no discharge. Left eye exhibits no discharge.  Neck: Normal range of motion. Neck supple. No JVD present. No tracheal deviation present.  Cardiovascular: Normal rate, regular rhythm, normal heart sounds and intact distal pulses.   Pulmonary/Chest: Effort normal and breath sounds normal. No respiratory distress. She has no wheezes. She has no rales. She exhibits no tenderness.  Abdominal: Soft. She exhibits no distension. There is tenderness.  Hyperactive bowel sounds, LUQ ttp, Murphy sign absent, Rovsing sign absent, no TTP at McBurney's point, no CVA tenderness  Musculoskeletal: She exhibits no edema.  Neurological: She is alert. No cranial nerve deficit or sensory deficit. She exhibits normal muscle tone.  Skin: Skin is warm and dry. No erythema.  Psychiatric: Her speech is normal. She is withdrawn. She exhibits a depressed mood. She expresses suicidal ideation. She expresses no homicidal ideation. She expresses suicidal plans. She expresses no homicidal plans.  Tearful while talking to me, does not appear to be responding to internal stimuli  Nursing note and vitals reviewed.    ED Treatments / Results  Labs (all labs ordered are listed, but only abnormal results are displayed) Labs Reviewed  COMPREHENSIVE METABOLIC PANEL - Abnormal; Notable for the following:       Result Value   Glucose, Bld 103 (*)    Creatinine, Ser 1.11 (*)    AST 67 (*)    ALT 60 (*)    Total Bilirubin 2.0 (*)    GFR calc non Af Amer 54 (*)    All other components within normal limits  ACETAMINOPHEN LEVEL - Abnormal; Notable for the following:    Acetaminophen (Tylenol), Serum <10 (*)    All other components within normal limits  CBC - Abnormal; Notable for the following:    RBC 5.25 (*)    Hemoglobin 15.7 (*)    HCT 47.2 (*)    All other components within normal limits    ETHANOL  SALICYLATE LEVEL  RAPID URINE DRUG SCREEN, HOSP PERFORMED    EKG  EKG Interpretation None       Radiology No results found.  Procedures Procedures (including critical care time)  Medications Ordered in ED Medications  gi cocktail (Maalox,Lidocaine,Donnatal) (0 mLs Oral Hold 11/20/16 1738)  acetaminophen (TYLENOL) tablet 650 mg (not administered)  amLODipine (NORVASC) tablet 5 mg (5 mg Oral Given 11/20/16 2217)  rosuvastatin (CRESTOR) tablet 20 mg (20 mg Oral Given 11/20/16 2217)  lisinopril (PRINIVIL,ZESTRIL) tablet 20 mg (20 mg Oral Given 11/20/16 2217)    And  hydrochlorothiazide (HYDRODIURIL) tablet 25 mg (25 mg Oral Given 11/20/16 2217)     Initial Impression / Assessment and Plan / ED Course  I have reviewed the triage vital signs and the nursing notes.  Pertinent labs & imaging results that were available during my care of the patient were reviewed by me and considered  in my medical decision making (see chart for details).      Patient with suicidal ideations. Afebrile, initially hypertensive and tachycardic while in the ED with improvement. Abdominal pain history and physical examination is consistent with GERD, patient's symptoms improved after administration of GI cocktail. Remainder of upper is reassuring. She is medically cleared for TTS evaluation at this time.  12:09 AM Berna Spare from TTS called to inform me that patient meets inpatient criteria. Dr. Jama Flavors accepting physician, pt will be in 401 bed 1. She is stable for transfer at this time. Final Clinical Impressions(s) / ED Diagnoses   Final diagnoses:  Suicidal ideation    New Prescriptions New Prescriptions   No medications on file     Bennye Alm 11/21/16 0011    Tegeler, Canary Brim, MD 11/21/16 [phone]

## 2016-11-20 NOTE — ED Notes (Signed)
Bed: [facility] Expected date:  Expected time:  Means of arrival:  Comments: HALL C

## 2016-11-20 NOTE — ED Notes (Signed)
Patient wanded by security. 

## 2016-11-20 NOTE — ED Triage Notes (Signed)
Patient reports she was sent from [facility] for psychiatric evaluation. Reports increased depression since losing her job in April. States "I've got health issues and bills." Tearful on assessment. Reports SI without plan. Denies any psych history.
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
## 2016-11-20 NOTE — Progress Notes (Signed)
Savannah Roy is a 56 y.o. female here for a new problem.  I acted as a Neurosurgeon for [employer], PA-C Corky Mull, LPN  History of Present Illness:   Chief Complaint  Patient presents with  . Abdominal Pain    Mid abdomen x 3 weeks  . Depression    HPI   Patient presents today to discuss abdominal pain, however when discussing her abdominal pain she is extremely tearful and discusses significant worsening of depression. We only addressed her depression today.  Depression screen PHQ 2/9 11/20/2016  Decreased Interest 3  Down, Depressed, Hopeless 3  PHQ - 2 Score 6  Altered sleeping 0  Tired, decreased energy 3  Change in appetite 3  Feeling bad or failure about yourself  3  Trouble concentrating 3  Moving slowly or fidgety/restless 2  Suicidal thoughts 2  PHQ-9 Score 22  Difficult doing work/chores Very difficult   She reports that she has had suicidal thoughts and a plan, but will not reveal a plan. States that she is afraid to commit suicide only because she is afraid that it will cause her to go to hell, as she is a Chiropodist. She has not left her house in 2 months. She lost her job several months ago and then she got a new job but was unable to keep it because it required prolonged standing and she couldn't do that with her knees. She states that she is behind on all of her bills and cannot afford anything right now, she has multiple bill collectors calling her daily. She does not have a good support system. She reports that her son lives in [place] and doesn't visit her often, but apparently saw her two weeks ago and told her that if she "didn't go to the doctor, he was going to take me to a mental hospital." She reports that she eats Oodles of Noodles and PB&J everyday, and that is all that her diet consists of, as she has not left the house to get groceries in 2 months. She is not on any medications for her depression at this time. She has lost 30 lb in the  past 6 months secondary to her depression.  Wt Readings from Last 3 Encounters:  11/20/16 (!) 348 lb 6.1 oz (158 kg)  05/17/16 (!) 376 lb 4 oz (170.7 kg)  05/10/16 (!) 383 lb 3.2 oz (173.8 kg)    Past Medical History:  Diagnosis Date  . LVH (left ventricular hypertrophy) due to hypertensive disease 05/10/2016  . Shortness of breath 04/07/2016     Social History   Social History  . Marital status: Divorced    Spouse name: N/A  . Number of children: N/A  . Years of education: N/A   Occupational History  . Not on file.   Social History Main Topics  . Smoking status: Former Games developer  . Smokeless tobacco: Never Used  . Alcohol use Yes     Comment: occasionally  . Drug use: No  . Sexual activity: No   Other Topics Concern  . Not on file   Social History Narrative   Divorced   Works at [employer] to [employer] for 2 years college   Enjoys reading and traveling    Past Surgical History:  Procedure Laterality Date  . ENDOMETRIAL ABLATION W/ NOVASURE  2007  . HERNIA REPAIR  1985  . KNEE ARTHROSCOPY  2005    Family History  Problem Relation Age  of Onset  . Cancer Mother   . Heart attack Mother   . CVA Mother   . Thyroid disease Mother   . Kidney failure Father   . Thyroid disease Sister   . Heart failure Maternal Grandmother     Allergies  Allergen Reactions  . Erythromycin     REACTION: rash    Current Medications:   Current Outpatient Prescriptions:  .  acetaminophen (TYLENOL) 325 MG tablet, Take 650 mg by mouth as needed., Disp: , Rfl:  .  lisinopril-hydrochlorothiazide (PRINZIDE,ZESTORETIC) 20-25 MG tablet, Take 1 tablet by mouth daily., Disp: 90 tablet, Rfl: 1 .  rosuvastatin (CRESTOR) 20 MG tablet, Take 1 tablet (20 mg total) by mouth daily., Disp: 90 tablet, Rfl: 0 .  amLODipine (NORVASC) 5 MG tablet, Take 1 tablet (5 mg total) by mouth daily., Disp: 30 tablet, Rfl: 5   Review of Systems:   Review of Systems  Gastrointestinal: Positive  for abdominal pain.  Psychiatric/Behavioral: Positive for depression and suicidal ideas. The patient is nervous/anxious and has insomnia.   All other systems reviewed and are negative.   Vitals:   Vitals:   11/20/16 1338  BP: 132/80  Pulse: (!) 104  Temp: 99.6 F (37.6 C)  TempSrc: Oral  SpO2: 96%  Weight: (!) 348 lb 6.1 oz (158 kg)  Height:  (1.702 m)     Body mass index is 54.56 kg/m.  Physical Exam:   Physical Exam  Constitutional: She appears well-developed. She is cooperative.  Non-toxic appearance. She does not have a sickly appearance. She does not appear ill. No distress.  Cardiovascular: Normal rate, regular rhythm, S1 normal, S2 normal, normal heart sounds and normal pulses.   No LE edema  Pulmonary/Chest: Effort normal and breath sounds normal.  Neurological: She is alert. GCS eye subscore is 4. GCS verbal subscore is 5. GCS motor subscore is 6.  Skin: Skin is warm, dry and intact.  Psychiatric: Her speech is normal. Her mood appears anxious. She is withdrawn. Cognition and memory are normal. She exhibits a depressed mood.  Crying throughout entire encounter  Nursing note and vitals reviewed.   Assessment and Plan:    Clovis was seen today for abdominal pain and depression.  Diagnoses and all orders for this visit:  Depression, major, single episode, severe (HCC) and suicidal ideation Reviewed case with Dr. Helane Rima as well as in-office therapist, Colen Darling, LCSW. Patient with several red flags discussed during her visit today, warranting visit to [facility] ED for possible [facility] evaluation. Rolly Pancake, RN also in to discuss with patient plan of care. After long discussion, patient was agreeable to [place] transportation to [facility] ED. Corky Mull, LPN, called [facility] ED and spoke with charge RN Matt to give information on patient.  Appreciate coordination of care.  Abdominal pain work-up per ED discretion.  I spent 25 minutes with  this patient, greater than 50% was face-to-face time counseling regarding the above diagnoses.  . Reviewed expectations re: course of current medical issues. . Discussed self-management of symptoms. . Outlined signs and symptoms indicating need for more acute intervention. . Patient verbalized understanding and all questions were answered. . See orders for this visit as documented in the electronic medical record. . Patient received an After-Visit Summary.  CMA or LPN served as scribe during this visit. History, Physical, and Plan performed by medical provider. Documentation and orders reviewed and attested to.  Jarold Motto, PA-C

## 2016-11-20 NOTE — BH Assessment (Signed)
[facility] Assessment Progress Note  Clinician informed by Selena Batten [facility]) that patient has been accepted to [facility].  Room assignment 401-1, to services of Dr. Jama Flavors.  Patient can come on over according to [facility].  Clinician obtained patient signature on voluntary admission forms.  Clinician called Michela Pitcher, PA and she said she would get the EMTALA completed.

## 2016-11-20 NOTE — ED Notes (Signed)
Sitter at bedside.

## 2016-11-21 ENCOUNTER — Inpatient Hospital Stay (HOSPITAL_COMMUNITY)
Admission: EM | Admit: 2016-11-21 | Discharge: 2016-11-24 | DRG: 885 | Disposition: A | Discharge status: Home / Self Care | Payer: BLUE CROSS/BLUE SHIELD | Source: Intra-hospital | Attending: Psychiatry | Admitting: Psychiatry

## 2016-11-21 ENCOUNTER — Encounter (HOSPITAL_COMMUNITY): Payer: Self-pay

## 2016-11-21 DIAGNOSIS — Z881 Allergy status to other antibiotic agents status: Secondary | ICD-10-CM

## 2016-11-21 DIAGNOSIS — E669 Obesity, unspecified: Secondary | ICD-10-CM | POA: Diagnosis present

## 2016-11-21 DIAGNOSIS — F332 Major depressive disorder, recurrent severe without psychotic features: Secondary | ICD-10-CM | POA: Diagnosis present

## 2016-11-21 DIAGNOSIS — R45851 Suicidal ideations: Secondary | ICD-10-CM | POA: Diagnosis present

## 2016-11-21 DIAGNOSIS — M25561 Pain in right knee: Secondary | ICD-10-CM | POA: Diagnosis present

## 2016-11-21 DIAGNOSIS — Z598 Other problems related to housing and economic circumstances: Secondary | ICD-10-CM | POA: Diagnosis not present

## 2016-11-21 DIAGNOSIS — M25562 Pain in left knee: Secondary | ICD-10-CM | POA: Diagnosis present

## 2016-11-21 DIAGNOSIS — E7800 Pure hypercholesterolemia, unspecified: Secondary | ICD-10-CM | POA: Diagnosis present

## 2016-11-21 DIAGNOSIS — Z823 Family history of stroke: Secondary | ICD-10-CM

## 2016-11-21 DIAGNOSIS — Z8249 Family history of ischemic heart disease and other diseases of the circulatory system: Secondary | ICD-10-CM | POA: Diagnosis not present

## 2016-11-21 DIAGNOSIS — R5383 Other fatigue: Secondary | ICD-10-CM

## 2016-11-21 DIAGNOSIS — Z872 Personal history of diseases of the skin and subcutaneous tissue: Secondary | ICD-10-CM

## 2016-11-21 DIAGNOSIS — Z79899 Other long term (current) drug therapy: Secondary | ICD-10-CM

## 2016-11-21 DIAGNOSIS — Z8349 Family history of other endocrine, nutritional and metabolic diseases: Secondary | ICD-10-CM

## 2016-11-21 DIAGNOSIS — G4700 Insomnia, unspecified: Secondary | ICD-10-CM | POA: Diagnosis present

## 2016-11-21 DIAGNOSIS — Z599 Problem related to housing and economic circumstances, unspecified: Secondary | ICD-10-CM

## 2016-11-21 DIAGNOSIS — Z809 Family history of malignant neoplasm, unspecified: Secondary | ICD-10-CM

## 2016-11-21 DIAGNOSIS — Z6837 Body mass index (BMI) 37.0-37.9, adult: Secondary | ICD-10-CM

## 2016-11-21 DIAGNOSIS — R4584 Anhedonia: Secondary | ICD-10-CM

## 2016-11-21 DIAGNOSIS — R450 Nervousness: Secondary | ICD-10-CM | POA: Diagnosis not present

## 2016-11-21 DIAGNOSIS — F419 Anxiety disorder, unspecified: Secondary | ICD-10-CM | POA: Diagnosis present

## 2016-11-21 DIAGNOSIS — Z8742 Personal history of other diseases of the female genital tract: Secondary | ICD-10-CM

## 2016-11-21 DIAGNOSIS — Z8719 Personal history of other diseases of the digestive system: Secondary | ICD-10-CM

## 2016-11-21 DIAGNOSIS — M2550 Pain in unspecified joint: Secondary | ICD-10-CM

## 2016-11-21 DIAGNOSIS — Z560 Unemployment, unspecified: Secondary | ICD-10-CM

## 2016-11-21 DIAGNOSIS — Z841 Family history of disorders of kidney and ureter: Secondary | ICD-10-CM

## 2016-11-21 DIAGNOSIS — Z87891 Personal history of nicotine dependence: Secondary | ICD-10-CM | POA: Diagnosis not present

## 2016-11-21 DIAGNOSIS — F39 Unspecified mood [affective] disorder: Secondary | ICD-10-CM | POA: Diagnosis not present

## 2016-11-21 DIAGNOSIS — G8929 Other chronic pain: Secondary | ICD-10-CM | POA: Diagnosis present

## 2016-11-21 DIAGNOSIS — I119 Hypertensive heart disease without heart failure: Secondary | ICD-10-CM | POA: Diagnosis present

## 2016-11-21 DIAGNOSIS — R945 Abnormal results of liver function studies: Secondary | ICD-10-CM | POA: Diagnosis present

## 2016-11-21 DIAGNOSIS — F329 Major depressive disorder, single episode, unspecified: Secondary | ICD-10-CM | POA: Diagnosis present

## 2016-11-21 MED ORDER — ALUM & MAG HYDROXIDE-SIMETH 200-200-20 MG/5ML PO SUSP: 30.0000 mL | ORAL | Status: DC | PRN

## 2016-11-21 MED ORDER — VENLAFAXINE HCL ER 37.5 MG PO CP24
37.5000 mg | ORAL_CAPSULE | Freq: Every day | ORAL | Status: DC
  Administered 2016-11-22 – 2016-11-23 (×2): 37.5 mg via ORAL
  Filled 2016-11-21 (×3): qty 1

## 2016-11-21 MED ORDER — ACETAMINOPHEN 325 MG PO TABS
650.0000 mg | ORAL_TABLET | Freq: Four times a day (QID) | ORAL | Status: DC | PRN
  Administered 2016-11-22 – 2016-11-24 (×3): 650 mg via ORAL
  Filled 2016-11-21 (×3): qty 2

## 2016-11-21 MED ORDER — ROSUVASTATIN CALCIUM 20 MG PO TABS
20.0000 mg | ORAL_TABLET | Freq: Every day | ORAL | Status: DC
  Administered 2016-11-21 – 2016-11-23 (×3): 20 mg via ORAL
  Filled 2016-11-21 (×5): qty 1

## 2016-11-21 MED ORDER — TRAZODONE HCL 50 MG PO TABS: 50.0000 mg | ORAL_TABLET | Freq: Every evening | ORAL | Status: DC | PRN

## 2016-11-21 MED ORDER — HYDROCHLOROTHIAZIDE 25 MG PO TABS
25.0000 mg | ORAL_TABLET | Freq: Every day | ORAL | Status: DC
  Administered 2016-11-22 – 2016-11-24 (×3): 25 mg via ORAL
  Filled 2016-11-21 (×5): qty 1

## 2016-11-21 MED ORDER — AMLODIPINE BESYLATE 5 MG PO TABS
5.0000 mg | ORAL_TABLET | Freq: Every day | ORAL | Status: DC
  Administered 2016-11-22 – 2016-11-24 (×3): 5 mg via ORAL
  Filled 2016-11-21 (×5): qty 1

## 2016-11-21 MED ORDER — MAGNESIUM HYDROXIDE 400 MG/5ML PO SUSP: 30.0000 mL | Freq: Every day | ORAL | Status: DC | PRN

## 2016-11-21 MED ORDER — HYDROXYZINE HCL 25 MG PO TABS
25.0000 mg | ORAL_TABLET | Freq: Four times a day (QID) | ORAL | Status: DC | PRN
  Administered 2016-11-22: 25 mg via ORAL
  Filled 2016-11-21: qty 1

## 2016-11-21 MED ORDER — LISINOPRIL 20 MG PO TABS
20.0000 mg | ORAL_TABLET | Freq: Every day | ORAL | Status: DC
  Administered 2016-11-22 – 2016-11-24 (×3): 20 mg via ORAL
  Filled 2016-11-21 (×5): qty 1

## 2016-11-21 NOTE — ED Notes (Addendum)
Report from [facility] RN that Hassie Bruce wants the patient to be monitored tonight at the ED due to high blood pressure and to transferred in the morning if the blood pressure goes down. Will continue to monitor patient.  Patient aware of the change and was receptive to that.

## 2016-11-21 NOTE — Tx Team (Signed)
Initial Treatment Plan 11/21/2016 10:38 AM [name]:[id number]    PATIENT STRESSORS: Financial difficulties Health problems Medication change or noncompliance Occupational concerns   PATIENT STRENGTHS: Ability for insight Average or above average intelligence Capable of independent living Communication skills Motivation for treatment/growth   PATIENT IDENTIFIED PROBLEMS: "not feeling so depressed"  "Coping skills"  Anxiety  Depression               DISCHARGE CRITERIA:  Ability to meet basic life and health needs Adequate post-discharge living arrangements Motivation to continue treatment in a less acute level of care Safe-care adequate arrangements made  PRELIMINARY DISCHARGE PLAN: Attend aftercare/continuing care group Outpatient therapy Return to previous living arrangement  PATIENT/FAMILY INVOLVEMENT: This treatment plan has been presented to and reviewed with the patient, Savannah Roy, and/or family member.  The patient and family have been given the opportunity to ask questions and make suggestions.  Clarene Critchley, RN 11/21/2016, 10:38 AM

## 2016-11-21 NOTE — Progress Notes (Signed)
Admission Note: Patient is an 56 year old female who is admitted to the unit for symptoms of depression, anxiety and suicidal thoughts.  Patient currently denies suicidal thoughts and verbally contracts for safety.  Patient states loss of job and finances are her stressors.  Patient presents with flat affect and depressed mood.  Admission packet reviewed and consent signed.  Skin assessment completed.  Skin is dry and intact.  Personal belonging searched.  No contraband found.  Patient oriented to the unit, staff and room.  Safety checks initiated.  Patient is safe on the unit.

## 2016-11-21 NOTE — ED Notes (Signed)
Attempted to call nursing report to [facility] Adult unit, nurse not available.

## 2016-11-21 NOTE — H&P (Signed)
Psychiatric Admission Assessment Adult  Patient Identification: Savannah Roy MRN:  [id number] Date of Evaluation:  11/21/2016 Chief Complaint:  " very depressed " Principal Diagnosis:  MDD, no psychotic features  Diagnosis:   Patient Active Problem List   Diagnosis Date Noted  . LVH (left ventricular hypertrophy) due to hypertensive disease [I11.9] 05/10/2016  . Shortness of breath [R06.02] 04/07/2016  . Chest pain [R07.9] 03/24/2016  . SEBACEOUS CYST, BREAST [N60.89] 08/14/2007  . CELLULITIS [L03.90, L02.91] 11/06/2006  . BMI 60.0-69.9, adult ([place]) [Z68.44] 05/16/2006  . Essential hypertension [I10] 05/16/2006  . OLIGOMENORRHEA [N91.5] 05/16/2006   History of Present Illness: 56 year old female, reports she went to her PCP due to abdominal discomfort. During visit she presented depressed , tearful, and reported suicidal ideations. Was encouraged to seek inpatient care . She reports she has been feeling severely depressed x 4 months, following losing her job with the resulting financial difficulties .Endorses neuro-vegetative symptoms as below, denies hallucinations . She has been socially isolating , rarely leaving her house over recent weeks. She denies any suicidal plans or intentions but has been having increased passive SI, and reports a general sense of hopelessness and pessimism. Associated Signs/Symptoms: Depression Symptoms:  depressed mood, anhedonia, insomnia, suicidal thoughts without plan, loss of energy/fatigue, decreased appetite, (Hypo) Manic Symptoms: none  Anxiety Symptoms:  Reports severe anxiety, particularly regarding her financial stressors Psychotic Symptoms:  Denies  PTSD Symptoms: Denies  Total Time spent with patient: 45 minutes  Past Psychiatric History: no prior psychiatric admissions, no prior suicidal attempts, no history of self cutting, denies history of violence, denies history of psychosis, denies history of mania ,denies panic or agoraphobia,  denies PTSD.  Is the patient at risk to self? Yes.    Has the patient been a risk to self in the past 6 months? No.  Has the patient been a risk to self within the distant past? No.  Is the patient a risk to others? No.  Has the patient been a risk to others in the past 6 months? No.  Has the patient been a risk to others within the distant past? No.   Prior Inpatient Therapy:  denies  Prior Outpatient Therapy:  denies   Alcohol Screening: Patient refused Alcohol Screening Tool: Yes 1. How often do you have a drink containing alcohol?: Never 9. Have you or someone else been injured as a result of your drinking?: No 10. Has a relative or friend or a doctor or another health worker been concerned about your drinking or suggested you cut down?: No Alcohol Use Disorder Identification Test Final Score (AUDIT): 0 Substance Abuse History in the last 12 months:  Denies any drug or alcohol abuse  Consequences of Substance Abuse: Denies  Previous Psychotropic Medications: states she was on Wellbutrin some years ago while she was going through divorce, she remembers it " may have helped" Psychological Evaluations:  No  Past Medical History:  Past Medical History:  Diagnosis Date  . LVH (left ventricular hypertrophy) due to hypertensive disease 05/10/2016  . Shortness of breath 04/07/2016    Past Surgical History:  Procedure Laterality Date  . ENDOMETRIAL ABLATION W/ NOVASURE  2007  . HERNIA REPAIR  1985  . KNEE ARTHROSCOPY  2005   Family History: mother is deceased, father is alive . Mother died from complications of MVA. Has one sister . Family History  Problem Relation Age of Onset  . Cancer Mother   . Heart attack Mother   .  CVA Mother   . Thyroid disease Mother   . Kidney failure Father   . Thyroid disease Sister   . Heart failure Maternal Grandmother    Family Psychiatric  History: denies history of mental illness in family. Tobacco Screening: Have you used any form of tobacco  in the last 30 days? (Cigarettes, Smokeless Tobacco, Cigars, and/or Pipes): No Social History: 56 year old female, divorced, has one adult son who lives with her, currently unemployed. Reports financial as her major stressor.  History  Alcohol Use  . Yes    Comment: occasionally     History  Drug Use No    Additional Social History:   Allergies:   Allergies  Allergen Reactions  . Erythromycin     REACTION: rash   Lab Results:  Results for orders placed or performed during the hospital encounter of 11/20/16 (from the past 48 hour(s))  Comprehensive metabolic panel     Status: Abnormal   Collection Time: 11/20/16  4:28 PM  Result Value Ref Range   Sodium 140 135 - 145 mmol/L   Potassium 3.5 3.5 - 5.1 mmol/L   Chloride 106 101 - 111 mmol/L   CO2 23 22 - 32 mmol/L   Glucose, Bld 103 (H) 65 - 99 mg/dL   BUN 16 6 - 20 mg/dL   Creatinine, Ser 1.11 (H) 0.44 - 1.00 mg/dL   Calcium 9.4 8.9 - 10.3 mg/dL   Total Protein 7.3 6.5 - 8.1 g/dL   Albumin 4.0 3.5 - 5.0 g/dL   AST 67 (H) 15 - 41 U/L   ALT 60 (H) 14 - 54 U/L   Alkaline Phosphatase 77 38 - 126 U/L   Total Bilirubin 2.0 (H) 0.3 - 1.2 mg/dL   GFR calc non Af Amer 54 (L) >60 mL/min   GFR calc Af Amer >60 >60 mL/min    Comment: (NOTE) The eGFR has been calculated using the CKD EPI equation. This calculation has not been validated in all clinical situations. eGFR's persistently <60 mL/min signify possible Chronic Kidney Disease.    Anion gap 11 5 - 15  Ethanol     Status: None   Collection Time: 11/20/16  4:28 PM  Result Value Ref Range   Alcohol, Ethyl (B) <10 <10 mg/dL    Comment:        LOWEST DETECTABLE LIMIT FOR SERUM ALCOHOL IS 10 mg/dL FOR MEDICAL PURPOSES ONLY Please note change in reference range.   Salicylate level     Status: None   Collection Time: 11/20/16  4:28 PM  Result Value Ref Range   Salicylate Lvl <5.4 2.8 - 30.0 mg/dL  Acetaminophen level     Status: Abnormal   Collection Time: 11/20/16  4:28  PM  Result Value Ref Range   Acetaminophen (Tylenol), Serum <10 (L) 10 - 30 ug/mL    Comment:        THERAPEUTIC CONCENTRATIONS VARY SIGNIFICANTLY. A RANGE OF 10-30 ug/mL MAY BE AN EFFECTIVE CONCENTRATION FOR MANY PATIENTS. HOWEVER, SOME ARE BEST TREATED AT CONCENTRATIONS OUTSIDE THIS RANGE. ACETAMINOPHEN CONCENTRATIONS >150 ug/mL AT 4 HOURS AFTER INGESTION AND >50 ug/mL AT 12 HOURS AFTER INGESTION ARE OFTEN ASSOCIATED WITH TOXIC REACTIONS.   cbc     Status: Abnormal   Collection Time: 11/20/16  4:28 PM  Result Value Ref Range   WBC 4.3 4.0 - 10.5 K/uL   RBC 5.25 (H) 3.87 - 5.11 MIL/uL   Hemoglobin 15.7 (H) 12.0 - 15.0 g/dL   HCT 47.2 (  H) 36.0 - 46.0 %   MCV 89.9 78.0 - 100.0 fL   MCH 29.9 26.0 - 34.0 pg   MCHC 33.3 30.0 - 36.0 g/dL   RDW 14.2 11.5 - 15.5 %   Platelets 175 150 - 400 K/uL    Blood Alcohol level:  Lab Results  Component Value Date   ETH <10 26/37/8588    Metabolic Disorder Labs:  Lab Results  Component Value Date   HGBA1C 5.3 03/21/2012   No results found for: PROLACTIN Lab Results  Component Value Date   CHOL 226 (H) 05/17/2016   TRIG 185.0 (H) 05/17/2016   HDL 29.30 (L) 05/17/2016   CHOLHDL 8 05/17/2016   VLDL 37.0 05/17/2016   LDLCALC 159 (H) 05/17/2016   LDLCALC 164 03/21/2012    Current Medications: No current facility-administered medications for this encounter.    PTA Medications: Prescriptions Prior to Admission  Medication Sig Dispense Refill Last Dose  . acetaminophen (TYLENOL) 325 MG tablet Take 650 mg by mouth as needed for moderate pain.    11/19/2016 at Unknown time  . amLODipine (NORVASC) 5 MG tablet Take 1 tablet (5 mg total) by mouth daily. 30 tablet 5 Past Week at Unknown time  . lisinopril-hydrochlorothiazide (PRINZIDE,ZESTORETIC) 20-25 MG tablet Take 1 tablet by mouth daily. 90 tablet 1 Past Week at Unknown time  . rosuvastatin (CRESTOR) 20 MG tablet Take 1 tablet (20 mg total) by mouth daily. 90 tablet 0 Past Week at  Unknown time    Musculoskeletal: Strength & Muscle Tone: within normal limits Gait & Station: normal Patient leans: N/A  Psychiatric Specialty Exam: Physical Exam  Review of Systems  Constitutional: Negative.   HENT: Negative.   Eyes: Negative.   Respiratory: Negative.   Cardiovascular: Negative.   Gastrointestinal: Negative.   Genitourinary: Negative.   Musculoskeletal: Positive for joint pain.       Chronic bilateral knee pain  Neurological: Negative for seizures.  Endo/Heme/Allergies: Negative.   Psychiatric/Behavioral: Positive for depression and suicidal ideas.  All other systems reviewed and are negative.   Blood pressure 114/78, pulse (!) 105, temperature 98 F (36.7 C), temperature source Oral, height 5' 8"  (1.727 m), weight 112.5 kg (248 lb), SpO2 95 %.Body mass index is 37.71 kg/m.  General Appearance: Well Groomed  Eye Contact:  Fair  Speech:  Normal Rate  Volume:  Normal  Mood:  Depressed  Affect:  Constricted and Tearful  Thought Process:  Linear and Descriptions of Associations: Intact  Orientation:  Full (Time, Place, and Person)  Thought Content:  no hallucinations, no delusions, not internally preoccupied   Suicidal Thoughts:  No denies any suicidal or self injurious ideations, denies any homicidal or violent ideations  Homicidal Thoughts:  No  Memory:  recent and remote grossly intact   Judgement:  Fair  Insight:  Fair  Psychomotor Activity:  Decreased  Concentration:  Concentration: Good and Attention Span: Good  Recall:  Good  Fund of Knowledge:  Good  Language:  Good  Akathisia:  Negative  Handed:  Right  AIMS (if indicated):     Assets:  Communication Skills Desire for Improvement Resilience  ADL's:  Intact  Cognition:  WNL  Sleep:       Treatment Plan Summary: Daily contact with patient to assess and evaluate symptoms and progress in treatment, Medication management, Plan inpatient treatment and medications as below  Observation  Level/Precautions:  15 minute checks  Laboratory:  as needed   Psychotherapy:  Milieu, group therapy  Medications:  Start Effexor XR 37.5 mgrs QAM for depression. Continue HCTZ, Lisonopril, Amlodipine ( home meds ) for HTN  Consultations:  As needed   Discharge Concerns:  -  Estimated LOS: 5 days   Other:     Physician Treatment Plan for Primary Diagnosis:  MDD , no psychotic features  Long Term Goal(s): Improvement in symptoms so as ready for discharge  Short Term Goals: Ability to identify changes in lifestyle to reduce recurrence of condition will improve, Ability to maintain clinical measurements within normal limits will improve and Compliance with prescribed medications will improve  Physician Treatment Plan for Secondary Diagnosis: MDD  Long Term Goal(s): Improvement in symptoms so as ready for discharge  Short Term Goals: Ability to verbalize feelings will improve, Ability to disclose and discuss suicidal ideas, Ability to demonstrate self-control will improve, Ability to identify and develop effective coping behaviors will improve, Ability to maintain clinical measurements within normal limits will improve and Compliance with prescribed medications will improve  I certify that inpatient services furnished can reasonably be expected to improve the patient's condition.    Jenne Campus, MD 10/9/201810:39 AM

## 2016-11-21 NOTE — [facility] Group Notes (Signed)
LCSW Group Therapy Note  11/21/2016 1:15pm  Type of Therapy/Topic:  Group Therapy:  Feelings about Diagnosis  Participation Level: Pt invited. Did not attend.   Zakari Bathe B Keaira Whitehurst, MSW, LCSWA 11/21/2016 4:01 PM   

## 2016-11-21 NOTE — ED Notes (Signed)
[facility] transport on unit to transfer pt to [facility] Adult unit per MD order. Personal property given to [facility] transport for transfer. Pt signed e-signature. Ambulatory off unit.  

## 2016-11-21 NOTE — [facility] Suicide Risk Assessment (Signed)
[facility] Admission Suicide Risk Assessment   Nursing information obtained from:   patient and chart  Demographic factors:    56 year old female Current Mental Status:   see below Loss Factors:   unemployment, financial stressors  Historical Factors:   no prior psychiatric admissions, no history of suicide attempts  Risk Reduction Factors:   resilience   Total Time spent with patient: 45 minutes Principal Problem:  MDD, no psychotic symptoms Diagnosis:   Patient Active Problem List   Diagnosis Date Noted  . MDD (major depressive disorder), recurrent severe, without psychosis (HCC) [F33.2] 11/21/2016  . LVH (left ventricular hypertrophy) due to hypertensive disease [I11.9] 05/10/2016  . Shortness of breath [R06.02] 04/07/2016  . Chest pain [R07.9] 03/24/2016  . SEBACEOUS CYST, BREAST [N60.89] 08/14/2007  . CELLULITIS [L03.90, L02.91] 11/06/2006  . BMI 60.0-69.9, adult (HCC) [Z68.44] 05/16/2006  . Essential hypertension [I10] 05/16/2006  . OLIGOMENORRHEA [N91.5] 05/16/2006    Continued Clinical Symptoms:  Alcohol Use Disorder Identification Test Final Score (AUDIT): 0 The "Alcohol Use Disorders Identification Test", Guidelines for Use in Primary Care, Second Edition.  World Science writer [facility]). Score between 0-7:  no or low risk or alcohol related problems. Score between 8-15:  moderate risk of alcohol related problems. Score between 16-19:  high risk of alcohol related problems. Score 20 or above:  warrants further diagnostic evaluation for alcohol dependence and treatment.   CLINICAL FACTORS:  56 year old female, presented for worsening depression, suicidal ideations. Had gone to see her PCP for abdominal symptoms and disclosed worsening depression and SI, leading to admission. Lost job a few months ago, which has contributed to worsening depression.   Musculoskeletal: Strength & Muscle Tone: within normal limits Gait & Station: normal Patient leans: N/A  Psychiatric  Specialty Exam: Physical Exam  ROS  Blood pressure 114/78, pulse (!) 105, temperature 98 F (36.7 C), temperature source Oral, resp. rate 18, height  (1.727 m), weight 112.5 kg (248 lb), SpO2 95 %.Body mass index is 37.71 kg/m.  See admit note MSE     COGNITIVE FEATURES THAT CONTRIBUTE TO RISK:  Decreased level of functioning   SUICIDE RISK:   Moderate:  Frequent suicidal ideation with limited intensity, and duration, some specificity in terms of plans, no associated intent, good self-control, limited dysphoria/symptomatology, some risk factors present, and identifiable protective factors, including available and accessible social support.  PLAN OF CARE: Patient will be admitted to inpatient psychiatric unit for stabilization and safety. Will provide and encourage milieu participation. Provide medication management and maked adjustments as needed.  Will follow daily.    I certify that inpatient services furnished can reasonably be expected to improve the patient's condition.   Craige Cotta, MD 11/21/2016, 2:08 PM

## 2016-11-22 DIAGNOSIS — R450 Nervousness: Secondary | ICD-10-CM

## 2016-11-22 DIAGNOSIS — Z87891 Personal history of nicotine dependence: Secondary | ICD-10-CM

## 2016-11-22 DIAGNOSIS — F419 Anxiety disorder, unspecified: Secondary | ICD-10-CM

## 2016-11-22 DIAGNOSIS — F39 Unspecified mood [affective] disorder: Secondary | ICD-10-CM

## 2016-11-22 LAB — RAPID URINE DRUG SCREEN, HOSP PERFORMED
Amphetamines: NOT DETECTED
BENZODIAZEPINES: NOT DETECTED
Barbiturates: NOT DETECTED
COCAINE: NOT DETECTED
OPIATES: NOT DETECTED
Tetrahydrocannabinol: NOT DETECTED

## 2016-11-22 LAB — TSH: TSH: 4.904 u[IU]/mL — ABNORMAL HIGH (ref 0.350–4.500)

## 2016-11-22 NOTE — Progress Notes (Signed)
[facility] MD Progress Note  11/22/2016 3:00 PM Savannah Roy  MRN:  [id number]   Subjective:  Patient reports that she is feeling "a little anxious or fidgety today." She reports she skipped breakfast today, but will not do that again as she felt agitated by lunch and after eating felt much better.  She is worried about getting food stamps or disability and feels like she is taking away from someone that really needs the help. She agreed later that she does need the help and would stop the assistance if she started working again. Patient denies any SI/HI/AVH and contracts for safety.  Objective: Patient's chart and findings reviewed and discussed with treatment team. Patient has been pleasant and cooperative. Patient appears depressed and tearful when discussing her situation at home. Her roommate is discussing with her ways to get disability, but the patient would rather work than get government assistance.   Principal Problem: MDD (major depressive disorder), recurrent severe, without psychosis (HCC) Diagnosis:   Patient Active Problem List   Diagnosis Date Noted  . MDD (major depressive disorder), recurrent severe, without psychosis (HCC) [F33.2] 11/21/2016  . LVH (left ventricular hypertrophy) due to hypertensive disease [I11.9] 05/10/2016  . Shortness of breath [R06.02] 04/07/2016  . Chest pain [R07.9] 03/24/2016  . SEBACEOUS CYST, BREAST [N60.89] 08/14/2007  . CELLULITIS [L03.90, L02.91] 11/06/2006  . BMI 60.0-69.9, adult (HCC) [Z68.44] 05/16/2006  . Essential hypertension [I10] 05/16/2006  . OLIGOMENORRHEA [N91.5] 05/16/2006   Total Time spent with patient: 25 Minutes  Past Psychiatric History: See H&P  Past Medical History:  Past Medical History:  Diagnosis Date  . LVH (left ventricular hypertrophy) due to hypertensive disease 05/10/2016  . Shortness of breath 04/07/2016    Past Surgical History:  Procedure Laterality Date  . ENDOMETRIAL ABLATION W/ NOVASURE  2007  . HERNIA  REPAIR  1985  . KNEE ARTHROSCOPY  2005   Family History:  Family History  Problem Relation Age of Onset  . Cancer Mother   . Heart attack Mother   . CVA Mother   . Thyroid disease Mother   . Kidney failure Father   . Thyroid disease Sister   . Heart failure Maternal Grandmother    Family Psychiatric  History: See H&P Social History:  History  Alcohol Use  . Yes    Comment: occasionally     History  Drug Use No    Social History   Social History  . Marital status: Divorced    Spouse name: N/A  . Number of children: N/A  . Years of education: N/A   Social History Main Topics  . Smoking status: Former Games developer  . Smokeless tobacco: Never Used  . Alcohol use Yes     Comment: occasionally  . Drug use: No  . Sexual activity: No   Other Topics Concern  . 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 acetaminophen (TYLENOL) tablet 650 mg  650 mg Oral Q6H PRN Laveda Abbe, NP   650 mg at 11/22/16 0816  . alum & mag hydroxide-simeth (  MAALOX/MYLANTA) 200-200-20 MG/5ML suspension 30 mL  30 mL Oral Q4H PRN Laveda Abbe, NP      . amLODipine (NORVASC) tablet 5 mg  5 mg Oral Daily Natashia Roseman, Rockey Situ, MD   5 mg at 11/22/16 0815  . hydrochlorothiazide (HYDRODIURIL) tablet 25 mg  25 mg Oral Daily Tanza Pellot, Rockey Situ, MD   25 mg at 11/22/16 0815  . hydrOXYzine (ATARAX/VISTARIL) tablet 25 mg  25 mg Oral Q6H PRN Laveda Abbe, NP      . lisinopril (PRINIVIL,ZESTRIL) tablet 20 mg  20 mg Oral Daily Jeshawn Melucci, Rockey Situ, MD   20 mg at 11/22/16 0814  . magnesium hydroxide (MILK OF MAGNESIA) suspension 30 mL  30 mL Oral Daily PRN Laveda Abbe, NP      . rosuvastatin (CRESTOR) tablet 20 mg  20 mg Oral q1800 Laveda Abbe, NP   20 mg at 11/21/16 1705  . traZODone (DESYREL) tablet 50 mg  50 mg Oral QHS PRN Laveda Abbe, NP      . venlafaxine XR [facility]) 24 hr capsule 37.5 mg  37.5 mg Oral Q breakfast Amesha Bailey, Rockey Situ, MD   37.5 mg at 11/22/16 0815    Lab Results:  Results for orders placed or performed during the hospital encounter of 11/21/16 (from the past 48 hour(s))  TSH     Status: Abnormal   Collection Time: 11/22/16  6:24 AM  Result Value Ref Range   TSH 4.904 (H) 0.350 - 4.500 uIU/mL    Comment: Performed by a 3rd Generation assay with a functional sensitivity of <=0.01 uIU/mL. Performed at [facility], [address], [place], [place] [address]   Rapid urine drug screen (hospital performed)     Status: None   Collection Time: 11/22/16  6:25 AM  Result Value Ref Range   Opiates NONE DETECTED NONE DETECTED   Cocaine NONE DETECTED NONE DETECTED   Benzodiazepines NONE DETECTED NONE DETECTED   Amphetamines NONE DETECTED NONE DETECTED   Tetrahydrocannabinol NONE DETECTED NONE DETECTED   Barbiturates NONE DETECTED NONE DETECTED    Comment:        DRUG SCREEN FOR MEDICAL PURPOSES ONLY.  IF CONFIRMATION IS NEEDED FOR ANY PURPOSE, NOTIFY LAB WITHIN 5 DAYS.        LOWEST DETECTABLE LIMITS FOR URINE DRUG SCREEN Drug Class       Cutoff (ng/mL) Amphetamine      1000 Barbiturate      200 Benzodiazepine   200 Tricyclics       300 Opiates          300 Cocaine          300 THC              50 Performed at [facility], [address] [place], [place] [address]     Blood Alcohol level:  Lab Results  Component Value Date   ETH <10 11/20/2016    Metabolic Disorder Labs: Lab Results  Component Value Date   HGBA1C 5.3 03/21/2012   No results found for: PROLACTIN Lab Results  Component Value Date   CHOL 226 (H) 05/17/2016   TRIG 185.0 (H)  05/17/2016   HDL 29.30 (L) 05/17/2016   CHOLHDL 8 05/17/2016   VLDL 37.0 05/17/2016   LDLCALC 159 (H) 05/17/2016   LDLCALC 164 03/21/2012    Physical Findings: AIMS: Facial and Oral Movements Muscles of Facial Expression: None, normal Lips and Perioral Area: None, normal Jaw:  None, normal Tongue: None, normal,Extremity Movements Upper (arms, wrists, hands, fingers): None, normal Lower (legs, knees, ankles, toes): None, normal, Trunk Movements Neck, shoulders, hips: None, normal, Overall Severity Severity of abnormal movements (highest score from questions above): None, normal Incapacitation due to abnormal movements: None, normal Patient's awareness of abnormal movements (rate only patient's report): No Awareness, Dental Status Current problems with teeth and/or dentures?: No Does patient usually wear dentures?: No  CIWA:    COWS:     Musculoskeletal: Strength & Muscle Tone: within normal limits Gait & Station: normal Patient leans: N/A  Psychiatric Specialty Exam: Physical Exam  Nursing note and vitals reviewed. Constitutional: She is oriented to person, place, and time. She appears well-developed and well-nourished.  Respiratory: Effort normal.  Musculoskeletal: Normal range of motion.  Neurological: She is alert and oriented to person, place, and time.  Skin: Skin is warm.    Review of Systems  Constitutional: Negative.   HENT: Negative.   Eyes: Negative.   Respiratory: Negative.   Cardiovascular: Negative.   Gastrointestinal: Negative.   Genitourinary: Negative.   Musculoskeletal: Negative.   Skin: Negative.   Neurological: Negative.   Endo/Heme/Allergies: Negative.   Psychiatric/Behavioral: Positive for depression. Negative for hallucinations, substance abuse and suicidal ideas. The patient is nervous/anxious. The patient does not have insomnia.     Blood pressure 113/76, pulse (!) 115, temperature 98.4 F (36.9 C), temperature source Oral, resp. rate 20,  height  (1.727 m), weight 112.5 kg (248 lb), SpO2 95 %.Body mass index is 37.71 kg/m.  General Appearance: Casual  Eye Contact:  Good  Speech:  Clear and Coherent and Normal Rate  Volume:  Normal  Mood:  Depressed  Affect:  Depressed and Flat and became tearful at times  Thought Process:  Coherent and Descriptions of Associations: Intact  Orientation:  Full (Time, Place, and Person)  Thought Content:  WDL  Suicidal Thoughts:  No  Homicidal Thoughts:  No  Memory:  Immediate;   Good Recent;   Good Remote;   Good  Judgement:  Good  Insight:  Good  Psychomotor Activity:  Normal  Concentration:  Concentration: Good and Attention Span: Good  Recall:  Good  Fund of Knowledge:  Good  Language:  Good  Akathisia:  No  Handed:  Right  AIMS (if indicated):     Assets:  Communication Skills Desire for Improvement Financial Resources/Insurance Housing Physical Health Social Support Transportation  ADL's:  Intact  Cognition:  WNL  Sleep:  Number of Hours: 6.75     Treatment Plan Summary: Daily contact with patient to assess and evaluate symptoms and progress in treatment, Medication management and Plan is to:  -Continue Effexor-XR 37.5 mg PO Daily for mood control -Continue Vistaril 25 mg PO Q6H PRN for anxiety -Continue Trazodone 50 mg PO QHS PRN for insomnia -Encourage group therapy participation  Maryfrances Bunnell, FNP 11/22/2016, 3:00 PM   Agree with NP Progress Note

## 2016-11-22 NOTE — Progress Notes (Signed)
Adult Psychoeducational Group Note  Date:  11/22/2016 Time:  3:55 AM  Group Topic/Focus:  Wrap-Up Group:   The focus of this group is to help patients review their daily goal of treatment and discuss progress on daily workbooks.  Participation Level:  Active  Participation Quality:  Appropriate  Affect:  Appropriate  Cognitive:  Appropriate  Insight: Appropriate  Engagement in Group:  Engaged  Modes of Intervention:  Discussion  Additional Comments:  Pt is a new admission. Pt stated her goal for today was to interact more with her peers. Pt stated she accomplished her goal. Pt rated her day a 3 out of 10. Pt attended all groups held today.  Savannah Roy 11/22/2016, 3:55 AM

## 2016-11-22 NOTE — Progress Notes (Signed)
Recreation Therapy Notes  Date: 11/22/16 Time: 0930 Location: [address] Dayroom  Group Topic: Stress Management  Goal Area(s) Addresses:  Patient will verbalize importance of using healthy stress management.  Patient will identify positive emotions associated with healthy stress management.   Intervention: Stress Management  Activity :  Guided Imagery.  LRT introduced the stress management technique of guided imagery.  LRT read a script leading patients on a journey floating on a cloud.  Patients were to follow a long as LRT read script to engage in the activity.  Education:  Stress Management, Discharge Planning.   Education Outcome: Acknowledges edcuation/In group clarification offered/Needs additional education  Clinical Observations/Feedback: Pt did not attend group.   Caroll Rancher, LRT/CTRS         Lillia Abed, Haniyyah Sakuma A 11/22/2016 11:40 AM
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
## 2016-11-22 NOTE — Progress Notes (Signed)
D- Report received from Austin Miles, RN.  At start of writer's shift, patient is observed resting quietly in bed, respirations even and unlabored, color satisfactory. Patient appears to be sleep.  No complaints. Per report received, "patient continues to be flat, depressed, and cooperative.  Patient attended wrap up group, her goal was to be visible in the milieu, rated her day/mood a 3/10 with 10 being the best, verbalized that her peers talking to her today eased her anxiety about being in a new place".  No nighttime medications given at this time. A- Routine safety checks conducted every 15 minutes. R- Patient remains safe at this time.

## 2016-11-22 NOTE — Plan of Care (Signed)
Problem: Education: Goal: Verbalization of understanding the information provided will improve Outcome: Completed/Met Date Met: 11/22/16 Patient verbalizes understanding of information, education provided. Involved in tx planning.  Problem: Medication: Goal: Compliance with prescribed medication regimen will improve Outcome: Completed/Met Date Met: 11/22/16 Patient is med compliant. Expresses need for prn medications when appropriate.

## 2016-11-22 NOTE — [facility] Suicide Risk Assessment (Signed)
[facility] INPATIENT:  Family/Significant Other Suicide Prevention Education  Suicide Prevention Education:  Patient Refusal for Family/Significant Other Suicide Prevention Education: The patient Savannah Roy has refused to provide written consent for family/significant other to be provided Family/Significant Other Suicide Prevention Education during admission and/or prior to discharge.  Physician notified.  Patient agreeable to contact w son, however he does not have a phone.  Reviewed SPE w patient and provided brochure.  Patient unwilling to provide names and contact information for friends who are supportive, does not want CSW to contact them re hospitalization.    Sallee Lange 11/22/2016, 12:43 PM

## 2016-11-22 NOTE — Tx Team (Signed)
Interdisciplinary Treatment and Diagnostic Plan Update  11/22/2016 Time of Session: 10:00 AM Savannah Roy MRN: [id number]  Principal Diagnosis: Major Depressive Disorder  Secondary Diagnoses: Active Problems:   MDD (major depressive disorder), recurrent severe, without psychosis (HCC)   Current Medications:  Current Facility-Administered Medications  Medication Dose Route Frequency Provider Last Rate Last Dose  . acetaminophen (TYLENOL) tablet 650 mg  650 mg Oral Q6H PRN Laveda Abbe, NP   650 mg at 11/22/16 0816  . alum & mag hydroxide-simeth (MAALOX/MYLANTA) 200-200-20 MG/5ML suspension 30 mL  30 mL Oral Q4H PRN Laveda Abbe, NP      . amLODipine (NORVASC) tablet 5 mg  5 mg Oral Daily Cobos, Rockey Situ, MD   5 mg at 11/22/16 0815  . hydrochlorothiazide (HYDRODIURIL) tablet 25 mg  25 mg Oral Daily Cobos, Rockey Situ, MD   25 mg at 11/22/16 0815  . hydrOXYzine (ATARAX/VISTARIL) tablet 25 mg  25 mg Oral Q6H PRN Laveda Abbe, NP      . lisinopril (PRINIVIL,ZESTRIL) tablet 20 mg  20 mg Oral Daily Cobos, Rockey Situ, MD   20 mg at 11/22/16 0814  . magnesium hydroxide (MILK OF MAGNESIA) suspension 30 mL  30 mL Oral Daily PRN Laveda Abbe, NP      . rosuvastatin (CRESTOR) tablet 20 mg  20 mg Oral q1800 Laveda Abbe, NP   20 mg at 11/21/16 1705  . traZODone (DESYREL) tablet 50 mg  50 mg Oral QHS PRN Laveda Abbe, NP      . venlafaxine XR [facility]) 24 hr capsule 37.5 mg  37.5 mg Oral Q breakfast Cobos, Rockey Situ, MD   37.5 mg at 11/22/16 0815   PTA Medications: Prescriptions Prior to Admission  Medication Sig Dispense Refill Last Dose  . acetaminophen (TYLENOL) 325 MG tablet Take 650 mg by mouth as needed for moderate pain.    11/19/2016 at Unknown time  . amLODipine (NORVASC) 5 MG tablet Take 1 tablet (5 mg total) by mouth daily. 30 tablet 5 Past Week at Unknown time  . lisinopril-hydrochlorothiazide (PRINZIDE,ZESTORETIC) 20-25 MG  tablet Take 1 tablet by mouth daily. 90 tablet 1 Past Week at Unknown time  . rosuvastatin (CRESTOR) 20 MG tablet Take 1 tablet (20 mg total) by mouth daily. 90 tablet 0 Past Week at Unknown time    Patient Stressors: Financial difficulties Health problems Medication change or noncompliance Occupational concerns  Patient Strengths: Ability for insight Average or above average intelligence Capable of independent living Manufacturing systems engineer Motivation for treatment/growth  Treatment Modalities: Medication Management, Group therapy, Case management,  1 to 1 session with clinician, Psychoeducation, Recreational therapy.   Physician Treatment Plan for Primary Diagnosis: <principal problem not specified> Long Term Goal(s): Improvement in symptoms so as ready for discharge Improvement in symptoms so as ready for discharge   Short Term Goals: Ability to identify changes in lifestyle to reduce recurrence of condition will improve Ability to maintain clinical measurements within normal limits will improve Compliance with prescribed medications will improve Ability to verbalize feelings will improve Ability to disclose and discuss suicidal ideas Ability to demonstrate self-control will improve Ability to identify and develop effective coping behaviors will improve Ability to maintain clinical measurements within normal limits will improve Compliance with prescribed medications will improve  Medication Management: Evaluate patient's response, side effects, and tolerance of medication regimen.  Therapeutic Interventions: 1 to 1 sessions, Unit Group sessions and Medication administration.  Evaluation of Outcomes: Progressing  Physician Treatment  Plan for Secondary Diagnosis: Active Problems:   MDD (major depressive disorder), recurrent severe, without psychosis (HCC)  Long Term Goal(s): Improvement in symptoms so as ready for discharge Improvement in symptoms so as ready for discharge    Short Term Goals: Ability to identify changes in lifestyle to reduce recurrence of condition will improve Ability to maintain clinical measurements within normal limits will improve Compliance with prescribed medications will improve Ability to verbalize feelings will improve Ability to disclose and discuss suicidal ideas Ability to demonstrate self-control will improve Ability to identify and develop effective coping behaviors will improve Ability to maintain clinical measurements within normal limits will improve Compliance with prescribed medications will improve     Medication Management: Evaluate patient's response, side effects, and tolerance of medication regimen.  Therapeutic Interventions: 1 to 1 sessions, Unit Group sessions and Medication administration.  Evaluation of Outcomes: Progressing   RN Treatment Plan for Primary Diagnosis: Major Depressive Disorder  Long Term Goal(s): Knowledge of disease and therapeutic regimen to maintain health will improve  Short Term Goals: Ability to verbalize feelings will improve, Ability to disclose and discuss suicidal ideas and Ability to identify and develop effective coping behaviors will improve  Medication Management: RN will administer medications as ordered by provider, will assess and evaluate patient's response and provide education to patient for prescribed medication. RN will report any adverse and/or side effects to prescribing provider.  Therapeutic Interventions: 1 on 1 counseling sessions, Psychoeducation, Medication administration, Evaluate responses to treatment, Monitor vital signs and CBGs as ordered, Perform/monitor CIWA, COWS, AIMS and Fall Risk screenings as ordered, Perform wound care treatments as ordered.  Evaluation of Outcomes: Progressing   LCSW Treatment Plan for Primary Diagnosis: Major Depressive Disorder  Long Term Goal(s): Safe transition to appropriate next level of care at discharge, Engage patient  in therapeutic group addressing interpersonal concerns.  Short Term Goals: Engage patient in aftercare planning with referrals and resources, Increase social support, Increase ability to appropriately verbalize feelings and Increase emotional regulation  Therapeutic Interventions: Assess for all discharge needs, 1 to 1 time with Social worker, Explore available resources and support systems, Assess for adequacy in community support network, Educate family and significant other(s) on suicide prevention, Complete Psychosocial Assessment, Interpersonal group therapy.  Evaluation of Outcomes: Progressing   Progress in Treatment: Attending groups: Yes. Participating in groups: Yes. Taking medication as prescribed: Yes. Toleration medication: Yes. Family/Significant other contact made: No, will contact:  son when patient can provide contact information, SPE provided to patient Patient understands diagnosis: Yes. Discussing patient identified problems/goals with staff: Yes. Medical problems stabilized or resolved: Yes. Denies suicidal/homicidal ideation: Yes. Issues/concerns per patient self-inventory: No. Other: NA  New problem(s) identified: Yes, Describe:  needs access to food pantries and referrals for job search process, information provided  New Short Term/Long Term Goal(s):  "to have a plan to get out and deal with job, expenses, getting my medications", "financial stressors"  Discharge Plan or Barriers: return home, follow up outpatient; cannot afford copays for mental health treatment, CSW assessing for resources  Reason for Continuation of Hospitalization: Depression Medication stabilization Suicidal ideation  Estimated Length of Stay:  3 - 5 ays; 11/24/16  Attendees: Patient: Savannah Roy 11/22/2016 1:54 PM  Physician: Sallyanne Havers MD 11/22/2016 1:54 PM  Nursing: Armando Reichert RN 11/22/2016 1:54 PM  RN Care Manager: 11/22/2016 1:54 PM  Social Worker: Governor Rooks LCS 11/22/2016 1:54  PM  Recreational Therapist:  11/22/2016 1:54 PM  Other:  11/22/2016 1:54 PM  Other:  11/22/2016 1:54 PM  Other: 11/22/2016 1:54 PM    Scribe for Treatment Team: Sallee Lange, LCSW 11/22/2016 1:54 PM

## 2016-11-22 NOTE — Progress Notes (Signed)
D: Patient observed up and visible in the milieu. Interacting appropriately with staff and peers. Frequent contacts made 1:1 throughout shift. Patient verbalizes to this Clinical research associate "today is a better than average day. I'm feeling more hopeful." Patient's affect flat, depressed with congruent mood however patient brightens with interaction. Actively participating in treatment and discharge planning. Per self inventory and discussions with writer, rates depression at a 1/10, hopelessness at a 0/10 and anxiety at a 0/10. Rates sleep as good, appetite as good, energy as normal and concentration as good.  States goal for today is to "be more cheerful, active as possible, talk with my group, dayroom." Complaining of chronic bilat knee pain rated at a 5/10, no other physical problems.   A: Medicated per orders, prn tylenol and vistaril given for complaints. Level III obs in place for safety. Emotional support offered and self inventory reviewed. Encouraged completion of Suicide Safety Plan and programming participation. Discussed POC with MD, SW.   R: Patient verbalizes understanding of POC. On reassess, patient reports improvement in anxiety and decrease in pain (0/10 this AM and 2/10 this evening.) Patient denies SI/HI/AVH and remains safe on level III obs. Will continue to monitor closely and make verbal contact frequently.

## 2016-11-22 NOTE — Progress Notes (Signed)
Patient attended group and said that her day was a 7.  Her coping skills were socializing and playing check-up.

## 2016-11-22 NOTE — [facility] Group Notes (Signed)
[facility] Group Therapy 11/22/2016 1:15pm  Type of Therapy: Mental Health Association Presentation  Participation Level: Active  Participation Quality: Attentive  Affect: Appropriate  Cognitive: Oriented  Insight: Developing/Improving  Engagement in Therapy: Engaged  Modes of Intervention: Discussion, Education and Socialization  Summary of Progress/Problems: Mental Health Association (MHA) Speaker came to talk about his personal journey with mental health. The pt processed ways by which to relate to the speaker. MHA speaker provided handouts and educational information pertaining to groups and services offered by the MHA. Pt was engaged in speaker's presentation and was receptive to resources provided.    Larie Mathes N Smart, LCSW 11/22/2016 4:15 PM  

## 2016-11-23 LAB — HEPATITIS C ANTIBODY

## 2016-11-23 LAB — HEPATITIS B SURFACE ANTIGEN: Hepatitis B Surface Ag: NEGATIVE

## 2016-11-23 MED ORDER — VENLAFAXINE HCL ER 75 MG PO CP24
75.0000 mg | ORAL_CAPSULE | Freq: Every day | ORAL | Status: DC
  Administered 2016-11-24: 75 mg via ORAL
  Filled 2016-11-23 (×3): qty 1

## 2016-11-23 NOTE — Progress Notes (Addendum)
[facility] MD Progress Note  11/23/2016 12:42 PM Savannah Roy  MRN:  [id number]   Subjective:  Patient reports that she is feeling better today. States she notices she is feeling more sociable, less isolative, and more " positive " , and states " I have not been crying since yesterday". She denies suicidal ideations Denies medication side effects at this time.  Objective:  I have discussed case with treatment team and have met with patient. Staff reports patient continues to present with a sad affect, but is more reactive, and brightens during interactions. Today she  presents with improved  mood and a fuller range of affect- denies any suicidal ideations. Behavior on unit calm, in good control, visible in day room, pleasant on approach. Denies medication side effects . Labs reviewed- Hep B and C workup negative ( * patient has mildly elevated LFTs, states has been told this before and told it could be related to fatty liver ) . TSH is slightly elevated  ( 4.904)   Principal Problem: MDD (major depressive disorder), recurrent severe, without psychosis ([place]) Diagnosis:   Patient Active Problem List   Diagnosis Date Noted  . MDD (major depressive disorder), recurrent severe, without psychosis ([place]) [F33.2] 11/21/2016  . LVH (left ventricular hypertrophy) due to hypertensive disease [I11.9] 05/10/2016  . Shortness of breath [R06.02] 04/07/2016  . Chest pain [R07.9] 03/24/2016  . SEBACEOUS CYST, BREAST [N60.89] 08/14/2007  . CELLULITIS [L03.90, L02.91] 11/06/2006  . BMI 60.0-69.9, adult ([place]) [Z68.44] 05/16/2006  . Essential hypertension [I10] 05/16/2006  . OLIGOMENORRHEA [N91.5] 05/16/2006   Total Time spent with patient: 20 minutes  Past Psychiatric History: See H&P  Past Medical History:  Past Medical History:  Diagnosis Date  . LVH (left ventricular hypertrophy) due to hypertensive disease 05/10/2016  . Shortness of breath 04/07/2016    Past Surgical History:  Procedure Laterality  Date  . ENDOMETRIAL ABLATION W/ NOVASURE  2007  . HERNIA REPAIR  1985  . KNEE ARTHROSCOPY  2005   Family History:  Family History  Problem Relation Age of Onset  . Cancer Mother   . Heart attack Mother   . CVA Mother   . Thyroid disease Mother   . Kidney failure Father   . Thyroid disease Sister   . Heart failure Maternal Grandmother    Family Psychiatric  History: See H&P Social History:  History  Alcohol Use  . Yes    Comment: occasionally     History  Drug Use No    Social History   Social History  . Marital status: Divorced    Spouse name: N/A  . Number of children: N/A  . Years of education: N/A   Social History Main Topics  . Smoking status: Former Research scientist (life sciences)  . Smokeless tobacco: Never Used  . Alcohol use Yes     Comment: occasionally  . Drug use: No  . Sexual activity: No   Other Topics Concern  . 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 acetaminophen (TYLENOL) tablet 650 mg  650 mg Oral Q6H PRN Ethelene Hal, NP   650 mg at 11/22/16 1719  . alum & mag hydroxide-simeth (MAALOX/MYLANTA) 200-200-20 MG/5ML suspension  30 mL  30 mL Oral Q4H PRN Ethelene Hal, NP      . amLODipine (NORVASC) tablet 5 mg  5 mg Oral Daily Stacyann Mcconaughy, Myer Peer, MD   5 mg at 11/23/16 0818  . hydrochlorothiazide (HYDRODIURIL) tablet 25 mg  25 mg Oral Daily Symeon Puleo, Myer Peer, MD   25 mg at 11/23/16 0818  . hydrOXYzine (ATARAX/VISTARIL) tablet 25 mg  25 mg Oral Q6H PRN Ethelene Hal, NP   25 mg at 11/22/16 1719  . lisinopril (PRINIVIL,ZESTRIL) tablet 20 mg  20 mg Oral Daily Kamri Gotsch, Myer Peer, MD   20 mg at 11/23/16 0818  . magnesium hydroxide (MILK OF MAGNESIA) suspension  30 mL  30 mL Oral Daily PRN Ethelene Hal, NP      . rosuvastatin (CRESTOR) tablet 20 mg  20 mg Oral q1800 Ethelene Hal, NP   20 mg at 11/22/16 1718  . traZODone (DESYREL) tablet 50 mg  50 mg Oral QHS PRN Ethelene Hal, NP      . Derrill Memo ON 11/24/2016] venlafaxine XR (EFFEXOR-XR) 24 hr capsule 75 mg  75 mg Oral Q breakfast Ky Rumple, Myer Peer, MD        Lab Results:  Results for orders placed or performed during the hospital encounter of 11/21/16 (from the past 48 hour(s))  Hepatitis B surface antigen     Status: None   Collection Time: 11/22/16  6:24 AM  Result Value Ref Range   Hepatitis B Surface Ag Negative Negative    Comment: (NOTE) Performed At: [facility] [place], [place] [id number] Lindon Romp MD [id number] Performed at [facility], [place] [address], [place], [place] [address]   Hepatitis C antibody     Status: None   Collection Time: 11/22/16  6:24 AM  Result Value Ref Range   HCV Ab <0.1 0.0 - 0.9 s/co ratio    Comment: (NOTE)                                  Negative:     < 0.8                             Indeterminate: 0.8 - 0.9                                  Positive:     > 0.9 The CDC recommends that a positive HCV antibody result be followed up with a HCV Nucleic Acid Amplification test ([id number]). Performed At: [facility] [place], [place] [id number] Lindon Romp MD OX:[id number] Performed at [facility], [place] [address], [place], [place] [address]   TSH     Status: Abnormal   Collection Time: 11/22/16  6:24 AM  Result Value Ref Range   TSH 4.904 (H) 0.350 - 4.500 uIU/mL    Comment: Performed by a 3rd Generation assay with a functional sensitivity of <=0.01 uIU/mL. Performed at [facility], [place] [address] [place], [place] [address]   Rapid urine drug screen (hospital performed)     Status: None   Collection Time:  11/22/16  6:25 AM  Result Value Ref Range   Opiates NONE DETECTED NONE DETECTED   Cocaine NONE DETECTED NONE DETECTED   Benzodiazepines  NONE DETECTED NONE DETECTED   Amphetamines NONE DETECTED NONE DETECTED   Tetrahydrocannabinol NONE DETECTED NONE DETECTED   Barbiturates NONE DETECTED NONE DETECTED    Comment:        DRUG SCREEN FOR MEDICAL PURPOSES ONLY.  IF CONFIRMATION IS NEEDED FOR ANY PURPOSE, NOTIFY LAB WITHIN 5 DAYS.        LOWEST DETECTABLE LIMITS FOR URINE DRUG SCREEN Drug Class       Cutoff (ng/mL) Amphetamine      1000 Barbiturate      200 Benzodiazepine   287 Tricyclics       681 Opiates          300 Cocaine          300 THC              50 Performed at [facility], [place] [address] [place], [place] [address]     Blood Alcohol level:  Lab Results  Component Value Date   ETH <10 20/35/5974    Metabolic Disorder Labs: Lab Results  Component Value Date   HGBA1C 5.3 03/21/2012   No results found for: PROLACTIN Lab Results  Component Value Date   CHOL 226 (H) 05/17/2016   TRIG 185.0 (H) 05/17/2016   HDL 29.30 (L) 05/17/2016   CHOLHDL 8 05/17/2016   VLDL 37.0 05/17/2016   LDLCALC 159 (H) 05/17/2016   LDLCALC 164 03/21/2012    Physical Findings: AIMS: Facial and Oral Movements Muscles of Facial Expression: None, normal Lips and Perioral Area: None, normal Jaw: None, normal Tongue: None, normal,Extremity Movements Upper (arms, wrists, hands, fingers): None, normal Lower (legs, knees, ankles, toes): None, normal, Trunk Movements Neck, shoulders, hips: None, normal, Overall Severity Severity of abnormal movements (highest score from questions above): None, normal Incapacitation due to abnormal movements: None, normal Patient's awareness of abnormal movements (rate only patient's report): No Awareness, Dental Status Current problems with teeth and/or dentures?: No Does patient usually wear dentures?: No  CIWA:    COWS:      Musculoskeletal: Strength & Muscle Tone: within normal limits Gait & Station: normal Patient leans: N/A  Psychiatric Specialty Exam: Physical Exam  Nursing note and vitals reviewed. Constitutional: She is oriented to person, place, and time. She appears well-developed and well-nourished.  Respiratory: Effort normal.  Musculoskeletal: Normal range of motion.  Neurological: She is alert and oriented to person, place, and time.  Skin: Skin is warm.    Review of Systems  Constitutional: Negative.   HENT: Negative.   Eyes: Negative.   Respiratory: Negative.   Cardiovascular: Negative.   Gastrointestinal: Negative.   Genitourinary: Negative.   Musculoskeletal: Negative.   Skin: Negative.   Neurological: Negative.   Endo/Heme/Allergies: Negative.   Psychiatric/Behavioral: Positive for depression. Negative for hallucinations, substance abuse and suicidal ideas. The patient is nervous/anxious. The patient does not have insomnia.   denies chest pain, no shortness of breath, no vomiting  Blood pressure 131/66, pulse (!) 110, temperature 99 F (37.2 C), temperature source Oral, resp. rate 18, height 5' 8"  (1.727 m), weight 112.5 kg (248 lb), SpO2 95 %.Body mass index is 37.71 kg/m.  General Appearance: improved grooming   Eye Contact:  Good  Speech:  Normal Rate  Volume:  Normal  Mood:  improving mood, states she feels better   Affect:  improving, more reactive today, not tearful and became tearful at times  Thought Process:  Linear and Descriptions of Associations: Intact  Orientation:  Full (Time, Place, and Person)  Thought Content:  no hallucinations, no delusions, not internally preoccupied   Suicidal Thoughts:  No today denies any suicidal ideations, denies self injurious ideations, no homicidal or violent ideations  Homicidal Thoughts:  No  Memory: recent and remote grossly intact   Judgement:  Other:  improving   Insight:  improving   Psychomotor Activity:  Normal   Concentration:  Concentration: Good and Attention Span: Good  Recall:  Good  Fund of Knowledge:  Good  Language:  Good  Akathisia:  No  Handed:  Right  AIMS (if indicated):     Assets:  Communication Skills Desire for Improvement Financial Resources/Insurance Housing Physical Health Social Support Transportation  ADL's:  Intact  Cognition:  WNL  Sleep:  Number of Hours: 6.75   Assessment - patient is presenting with gradually improving mood and range of affect . Denies suicidal or self injurious ideations at this time. Tolerating Effexor XR well thus far  Treatment Plan Summary: Daily contact with patient to assess and evaluate symptoms and progress in treatment, Medication management and Plan is to:  - Encourage group and milieu participation to work on coping skills and symptom reduction -Increase  EffexorXR to 75  mg PO QDAY for depression, anxiety -Continue Vistaril 25 mg PO Q6H PRN for anxiety -Continue Trazodone 50 mg PO QHS PRN for insomnia -Treatment team working on disposition planning options - Will check FT3, FT4 to follow up on mildly elevated TSH.  Jenne Campus, MD 11/23/2016, 12:42 PM   Patient ID: Rae Mar, female   DOB: June 09, 1960, 56 y.o.   MRN: [id number]

## 2016-11-23 NOTE — Progress Notes (Signed)
Adult Psychoeducational Group Note  Date:  11/23/2016 Time:  8:00 PM  Group Topic/Focus:  Wrap-Up Group:   The focus of this group is to help patients review their daily goal of treatment and discuss progress on daily workbooks.  Participation Level:  Active  Participation Quality:  Appropriate, Sharing and Supportive  Affect:  Appropriate  Cognitive:  Alert, Appropriate and Oriented  Insight: Appropriate and Improving  Engagement in Group:  Engaged and Improving  Modes of Intervention:  Discussion  Additional Comments:  Patient reports her day was a "9/10" and states, "I feel great, like my old self again". Patient reports she has "learned a lot from" her peers and "being involved in groups". Patient reports her goal tomorrow is to "have a plan in place for discharge".  Savannah Roy 11/23/2016, 9:55 PM

## 2016-11-23 NOTE — Progress Notes (Signed)
D: Patient denies SI, HI or AVH today. Patient has a depressed mood and flat affect but brightens with conversation.  Pt. States, "I am feeling great today and look forward to going home".  Pt. States that she lives in a private residence with her 58 year old son and plans to return that living situation.  Pt. States her sleep, concentration, energy and appetite are good and rates her depression/anxiety as 0/10.  A: Patient given emotional support from RN. Patient encouraged to come to staff with concerns and/or questions. Patient's medication routine continued. Patient's orders and plan of care reviewed.   R: Patient remains appropriate and cooperative. Will continue to monitor patient q15 minutes for safety.

## 2016-11-23 NOTE — [facility] Group Notes (Signed)
LCSW Group Therapy Note  11/23/2016 1:15pm  Type of Therapy/Topic:  Group Therapy:  Balance in Life  Participation Level:  Active  Description of Group:    This group will address the concept of balance and how it feels and looks when one is unbalanced. Patients will be encouraged to process areas in their lives that are out of balance and identify reasons for remaining unbalanced. Facilitators will guide patients in utilizing problem-solving interventions to address and correct the stressor making their life unbalanced. Understanding and applying boundaries will be explored and addressed for obtaining and maintaining a balanced life. Patients will be encouraged to explore ways to assertively make their unbalanced needs known to significant others in their lives, using other group members and facilitator for support and feedback.  Therapeutic Goals: 1. Patient will identify two or more emotions or situations they have that consume much of in their lives. 2. Patient will identify signs/triggers that life has become out of balance:  3. Patient will identify two ways to set boundaries in order to achieve balance in their lives:  4. Patient will demonstrate ability to communicate their needs through discussion and/or role plays  Summary of Patient Progress: Pt was present for the duration of the group. Pt states that she is feeling much more balanced now than she did when she first came into the hospital. Pt states that she is going to start opening up more to her support system instead of being ashamed of how she feels.      Therapeutic Modalities:   Cognitive Behavioral Therapy Solution-Focused Therapy Assertiveness Training  Jonathon Jordan, MSW, LCSWA 11/23/2016 3:17 PM

## 2016-11-23 NOTE — Progress Notes (Addendum)
D: Pt observed in the dayroom interaction with peer. Pt at the time of assessment endorsed chronic moderate knee pain-see MAR. Pt denied any anxiety, depression, SI, HI or AVH; "this is the best I've been in a long time." Pt remained calm and cooperative. A: Medications offered as prescribed. Pt given the opportunity to ask questions and state concerns. Support, encouragement, and safe environment provided. R: Pt was med compliant. All patient's questions and concerns addressed. 15-minute safety checks continue. Safety checks continue. Pt attended wrap-up group.

## 2016-11-24 LAB — T4, FREE: Free T4: 0.88 ng/dL (ref 0.61–1.12)

## 2016-11-24 MED ORDER — TRAZODONE HCL 50 MG PO TABS
50.0000 mg | ORAL_TABLET | Freq: Every evening | ORAL | 0 refills | Status: DC | PRN
Start: 2016-11-24 — End: 2017-10-02

## 2016-11-24 MED ORDER — AMLODIPINE BESYLATE 5 MG PO TABS: 5.0000 mg | ORAL_TABLET | Freq: Every day | ORAL | 0 refills | Status: DC

## 2016-11-24 MED ORDER — HYDROCHLOROTHIAZIDE 25 MG PO TABS: 25.0000 mg | ORAL_TABLET | Freq: Every day | ORAL | 0 refills | Status: DC

## 2016-11-24 MED ORDER — ROSUVASTATIN CALCIUM 20 MG PO TABS: 20.0000 mg | ORAL_TABLET | Freq: Every day | ORAL | 0 refills | Status: DC

## 2016-11-24 MED ORDER — VENLAFAXINE HCL ER 75 MG PO CP24: 75.0000 mg | ORAL_CAPSULE | Freq: Every day | ORAL | 0 refills | Status: DC

## 2016-11-24 MED ORDER — HYDROXYZINE HCL 25 MG PO TABS: 25.0000 mg | ORAL_TABLET | Freq: Four times a day (QID) | ORAL | 0 refills | Status: DC | PRN

## 2016-11-24 MED ORDER — LISINOPRIL 20 MG PO TABS: 20.0000 mg | ORAL_TABLET | Freq: Every day | ORAL | 0 refills | Status: DC

## 2016-11-24 NOTE — Discharge Summary (Signed)
Physician Discharge Summary Note  Patient:  Savannah Roy is an 56 y.o., female MRN:  [id number] DOB:  August 30, 1960 Patient phone:  [phone] (home)  Patient address:   [address] [place] [address],  Total Time spent with patient: 20 minutes  Date of Admission:  11/21/2016 Date of Discharge: 11/24/16  Reason for Admission:  Worsening depression with SI  Principal Problem: MDD (major depressive disorder), recurrent severe, without psychosis [facility]) Discharge Diagnoses: Patient Active Problem List   Diagnosis Date Noted  . MDD (major depressive disorder), recurrent severe, without psychosis (HCC) [F33.2] 11/21/2016  . LVH (left ventricular hypertrophy) due to hypertensive disease [I11.9] 05/10/2016  . Shortness of breath [R06.02] 04/07/2016  . Chest pain [R07.9] 03/24/2016  . SEBACEOUS CYST, BREAST [N60.89] 08/14/2007  . CELLULITIS [L03.90, L02.91] 11/06/2006  . BMI 60.0-69.9, adult (HCC) [Z68.44] 05/16/2006  . Essential hypertension [I10] 05/16/2006  . OLIGOMENORRHEA [N91.5] 05/16/2006    Past Psychiatric History: Denies  Past Medical History:  Past Medical History:  Diagnosis Date  . LVH (left ventricular hypertrophy) due to hypertensive disease 05/10/2016  . Shortness of breath 04/07/2016    Past Surgical History:  Procedure Laterality Date  . ENDOMETRIAL ABLATION W/ NOVASURE  2007  . HERNIA REPAIR  1985  . KNEE ARTHROSCOPY  2005   Family History:  Family History  Problem Relation Age of Onset  . Cancer Mother   . Heart attack Mother   . CVA Mother   . Thyroid disease Mother   . Kidney failure Father   . Thyroid disease Sister   . Heart failure Maternal Grandmother    Family Psychiatric  History: Denies Social History:  History  Alcohol Use  . Yes    Comment: occasionally     History  Drug Use No    Social History   Social History  . Marital status: Divorced    Spouse name: N/A  . Number of children: N/A  . Years of education: N/A    Social History Main Topics  . Smoking status: Former Games developer  . Smokeless tobacco: Never Used  . Alcohol use Yes     Comment: occasionally  . Drug use: No  . Sexual activity: No   Other Topics Concern  . 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 She states that she has been under a lot of stress lately and has lost her job, is not collecting unemployment, and has not been able to afford her medications for the past several weeks. She endorses suicidal ideations with plan to "initially I thought I would use a gun, but I don't have access to one. Then I started thinking it use my car". Denies homicidal ideation or auditory or visual hallucinations. No prior suicide attempts. Deniesintentional overdose or ingestion of medications. Denies alcohol or recreational drug use. States that she has not left her apartment in about 2 months. Patient went to her primary care physician today.  PCP was concerned because patient said that she has be isolating herself in her own home.  Has been having thoughts of killing herself.  Patient's doctor sent her to [facility] for further evaluation. Patient is tearful during assessment.  She says she thought shooting herself (doesn't own a gun) or using her car (to "drive off something").  Patient says however "I could never do these things to myself."  She cites religious beliefs and obligation to her 16 year old son (who lives with her). Patient has no HI or A/V hallucinations. 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  Since then she has become increasingly depressed.  She stays in her room most of the time.  She will shower infrequently.  Stays in  bed and sleeps well over 12 hours per day.  Keeps her blinds down to keep the room dark.  She even got a portable toilet and brought it in so she wouldn't have to leave room to use toilet.  Her son does not work and patient says that there have been some times when there has not been any food in the house.  Patient has a poor appetite and some limitations on access to food.  She sleeps over 12 hours per day.  Anxiety is heightened and she cries often.  Patient has no ETOH or illicit drug use history.  No previous mental health care.  11/21/16 Assessment: 56 year old female, reports she went to her PCP due to abdominal discomfort. During visit she presented depressed , tearful, and reported suicidal ideations. Was encouraged to seek inpatient care . She reports she has been feeling severely depressed x 4 months, following losing her job with the resulting financial difficulties .Endorses neuro-vegetative symptoms as below, denies hallucinations . She has been socially isolating , rarely leaving her house over recent weeks. She denies any suicidal plans or intentions but has been having increased passive SI, and reports a general sense of hopelessness and pessimism.   Today patient has remained on th [facility] unit for 4 days and has stabilized on medications and therapy. Patient was started on Effexor-XR and titrated to 75 mg PO Daily and used Trazodone PRN for sleep. Patient was also out of her medical medications for HTN and LVH and was provided prescriptions for those as well. Patient showed improvement with improved sleep, appetite, mood, interaction, and affect. She was seen interacting appropriately with staff and other patients. She was seen attending group and participating. Patinet was provided with prescriptions for her medications upon discharge. She will be discharged home with family. She denies any SI/HI/AVH and contracts for safety.   Physical Findings: AIMS: Facial and Oral Movements Muscles of  Facial Expression: None, normal Lips and Perioral Area: None, normal Jaw: None, normal Tongue: None, normal,Extremity Movements Upper (arms, wrists, hands, fingers): None, normal Lower (legs, knees, ankles, toes): None, normal, Trunk Movements Neck, shoulders, hips: None, normal, Overall Severity Severity of abnormal movements (highest score from questions above): None, normal Incapacitation due to abnormal movements: None, normal Patient's awareness of abnormal movements (rate only patient's report): No Awareness, Dental Status Current problems with teeth and/or dentures?: No Does patient usually wear dentures?: No  CIWA:    COWS:     Musculoskeletal: Strength & Muscle Tone: within normal limits Gait & Station: normal Patient leans: N/A  Psychiatric Specialty Exam: Physical Exam  Nursing note and vitals reviewed. Constitutional: She is oriented to person, place, and time. She appears well-developed and well-nourished.  Respiratory: Effort normal.  Musculoskeletal: Normal range of motion.  Neurological: She is alert and oriented to person, place, and time.  Skin: Skin is warm.    Review of Systems  Constitutional: Negative.   HENT: Negative.  Eyes: Negative.   Respiratory: Negative.   Cardiovascular: Negative.   Gastrointestinal: Negative.   Genitourinary: Negative.   Musculoskeletal: Negative.   Skin: Negative.   Neurological: Negative.   Endo/Heme/Allergies: Negative.   Psychiatric/Behavioral: Negative for depression, hallucinations and suicidal ideas. The patient is not nervous/anxious.     Blood pressure (!) 108/59, pulse (!) 133, temperature 99.2 F (37.3 C), resp. rate 18, height  (1.727 m), weight 112.5 kg (248 lb), SpO2 95 %.Body mass index is 37.71 kg/m.  General Appearance: Casual  Eye Contact:  Good  Speech:  Clear and Coherent and Normal Rate  Volume:  Normal  Mood:  Euthymic  Affect:  Appropriate  Thought Process:  Coherent and Descriptions of  Associations: Intact  Orientation:  Full (Time, Place, and Person)  Thought Content:  WDL  Suicidal Thoughts:  No  Homicidal Thoughts:  No  Memory:  Immediate;   Good Recent;   Good Remote;   Good  Judgement:  Good  Insight:  Good  Psychomotor Activity:  Normal  Concentration:  Concentration: Good and Attention Span: Good  Recall:  Good  Fund of Knowledge:  Good  Language:  Good  Akathisia:  No  Handed:  Right  AIMS (if indicated):     Assets:  Communication Skills Desire for Improvement Financial Resources/Insurance Housing Physical Health Social Support Transportation  ADL's:  Intact  Cognition:  WNL  Sleep:  Number of Hours: 6.75     Have you used any form of tobacco in the last 30 days? (Cigarettes, Smokeless Tobacco, Cigars, and/or Pipes): No  Has this patient used any form of tobacco in the last 30 days? (Cigarettes, Smokeless Tobacco, Cigars, and/or Pipes) Yes, No  Blood Alcohol level:  Lab Results  Component Value Date   ETH <10 11/20/2016    Metabolic Disorder Labs:  Lab Results  Component Value Date   HGBA1C 5.3 03/21/2012   No results found for: PROLACTIN Lab Results  Component Value Date   CHOL 226 (H) 05/17/2016   TRIG 185.0 (H) 05/17/2016   HDL 29.30 (L) 05/17/2016   CHOLHDL 8 05/17/2016   VLDL 37.0 05/17/2016   LDLCALC 159 (H) 05/17/2016   LDLCALC 164 03/21/2012    See Psychiatric Specialty Exam and Suicide Risk Assessment completed by Attending Physician prior to discharge.  Discharge destination:  Home  Is patient on multiple antipsychotic therapies at discharge:  No   Has Patient had three or more failed trials of antipsychotic monotherapy by history:  No  Recommended Plan for Multiple Antipsychotic Therapies: NA   Allergies as of 11/24/2016      Reactions   Erythromycin    REACTION: rash      Medication List    STOP taking these medications   acetaminophen 325 MG tablet Commonly known as:  TYLENOL    lisinopril-hydrochlorothiazide 20-25 MG tablet Commonly known as:  PRINZIDE,ZESTORETIC     TAKE these medications     Indication  amLODipine 5 MG tablet Commonly known as:  NORVASC Take 1 tablet (5 mg total) by mouth daily. For high blood pressure What changed:  additional instructions  Indication:  High Blood Pressure Disorder   hydrochlorothiazide 25 MG tablet Commonly known as:  HYDRODIURIL Take 1 tablet (25 mg total) by mouth daily. For high blood pressure  Indication:  High Blood Pressure Disorder   hydrOXYzine 25 MG tablet Commonly known as:  ATARAX/VISTARIL Take 1 tablet (25 mg total) by mouth every 6 (six) hours as needed for anxiety.  Indication:  Feeling Anxious   lisinopril 20 MG tablet Commonly known as:  PRINIVIL,ZESTRIL Take 1 tablet (20 mg total) by mouth daily. For high blood pressure  Indication:  High Blood Pressure Disorder   rosuvastatin 20 MG tablet Commonly known as:  CRESTOR Take 1 tablet (20 mg total) by mouth daily at 6 PM. For high cholesterol What changed:  when to take this  additional instructions  Indication:  High Amount of Fats in the Blood   traZODone 50 MG tablet Commonly known as:  DESYREL Take 1 tablet (50 mg total) by mouth at bedtime as needed for sleep.  Indication:  Trouble Sleeping   venlafaxine XR 75 MG 24 hr capsule Commonly known as:  EFFEXOR-XR Take 1 capsule (75 mg total) by mouth daily with breakfast. For mood control  Indication:  mood stability      Follow-up Information    [facility] Follow up on 11/27/2016.   Why:  Hospital discharge follow up and medications management w Alden Server PS on 10/15 at 11 AM.  Please call to cancel/reschedule if needed.   Contact information: [address]  [place], [place] [address] Phone: [phone] Fax:  [phone]       [facility] Follow up.   Why:  Referral made for assistance w nutritoinal needs. Contact information: Merchandiser, retail  and Nutrition Program [facility] [address] [place] [address] Phone [phone] Fax:  [phone]       [facility] Follow up.   Specialty:  Family Medicine Why:  Please stop at the [facility] to pick up a box of food that is being held for you at the front desk. Practice is closed for lunch 12:30 - 1:30. Contact information: [address] [id number] Wilhe[place] [address] [phone]          Follow-up recommendations:  Continue activity as tolerated. Continue diet as recommended by your PCP. Ensure to keep all appointments with outpatient providers.  Comments:  Patient is instructed prior to discharge to: Take all medications as prescribed by his/her mental healthcare provider. Report any adverse effects and or reactions from the medicines to his/her outpatient provider promptly. Patient has been instructed & cautioned: To not engage in alcohol and or illegal drug use while on prescription medicines. In the event of worsening symptoms, patient is instructed to call the crisis hotline, 911 and or go to the nearest ED for appropriate evaluation and treatment of symptoms. To follow-up with his/her primary care provider for your other medical issues, concerns and or health care needs.    Signed: Gerlene Burdock Money, FNP 11/24/2016, 8:49 AM   Patient seen, Suicide Assessment Completed.  Disposition Plan Reviewed

## 2016-11-24 NOTE — Progress Notes (Signed)
Recreation Therapy Notes  Date: 11/24/16 Time: 0930 Location: 300 Hall Group Room  Group Topic: Stress Management  Goal Area(s) Addresses:  Patient will verbalize importance of using healthy stress management.  Patient will identify positive emotions associated with healthy stress management.   Intervention: Stress Management  Activity :  Meditation.  LRT introduced the concept of meditation to patients.  Patients were to listen and follow along as the meditation played to fully engage in the activity.  Education:  Stress Management, Discharge Planning.   Education Outcome: Acknowledges edcuation/In group clarification offered/Needs additional education  Clinical Observations/Feedback: Pt did not attend group.   Cally Nygard, LRT/CTRS         Vi Biddinger A 11/24/2016 12:09 PM 

## 2016-11-24 NOTE — [facility] Suicide Risk Assessment (Signed)
[facility] Discharge Suicide Risk Assessment   Principal Problem: MDD (major depressive disorder), recurrent severe, without psychosis (HCC) Discharge Diagnoses:  Patient Active Problem List   Diagnosis Date Noted  . MDD (major depressive disorder), recurrent severe, without psychosis (HCC) [F33.2] 11/21/2016  . LVH (left ventricular hypertrophy) due to hypertensive disease [I11.9] 05/10/2016  . Shortness of breath [R06.02] 04/07/2016  . Chest pain [R07.9] 03/24/2016  . SEBACEOUS CYST, BREAST [N60.89] 08/14/2007  . CELLULITIS [L03.90, L02.91] 11/06/2006  . BMI 60.0-69.9, adult (HCC) [Z68.44] 05/16/2006  . Essential hypertension [I10] 05/16/2006  . OLIGOMENORRHEA [N91.5] 05/16/2006    Total Time spent with patient: 30 minutes  Musculoskeletal: Strength & Muscle Tone: within normal limits Gait & Station: normal Patient leans: N/A  Psychiatric Specialty Exam: ROS no headache, no chest pain, no shortness of breath, no vomiting   Blood pressure (!) 108/59, pulse (!) 133, temperature 99.2 F (37.3 C), resp. rate 18, height  (1.727 m), weight 112.5 kg (248 lb), SpO2 95 %.Body mass index is 37.71 kg/m.  General Appearance: improved grooming   Eye Contact::  Good  Speech:  Normal Rate409  Volume:  Normal  Mood:  improved - states " this is the best I have felt for a long time"  Affect:  Appropriate and Full Range  Thought Process:  Linear and Descriptions of Associations: Intact  Orientation:  Other:  fully alert and attentive  Thought Content:  no hallucinations, no delusions, not internally preoccupied   Suicidal Thoughts:  No denies any suicidal or self injurious ideations, denies any homicidal or violent ideations  Homicidal Thoughts:  No  Memory:  recent and remote grossly intact   Judgement:  Other:  improving   Insight:  improving   Psychomotor Activity:  Normal  Concentration:  Good  Recall:  Good  Fund of Knowledge:Good  Language: Good  Akathisia:  Negative  Handed:   Right  AIMS (if indicated):     Assets:  Communication Skills Desire for Improvement Resilience  Sleep:  Number of Hours: 6.75  Cognition: WNL  ADL's:  Intact   Mental Status Per Nursing Assessment::   On Admission:     Demographic Factors:  56 year old divorced female, has one adult son who lives with her, currently unemployed   Loss Factors: Unemployment , financial difficulties   Historical Factors: Denies prior history of severe depressive episodes, denies history of prior suicidal attempts, no prior psychiatric admissions   Risk Reduction Factors:   Sense of responsibility to family, Living with another person, especially a relative and Positive coping skills or problem solving skills  Continued Clinical Symptoms:  At this time patient is alert, attentive, well related, pleasant, mood improved, presents euthymic, affect appropriate and reactive, no thought disorder, no suicidal or self injurious ideations, no hallucinations, no delusions, future oriented, states she plans to go to the [employer] [facility], and is hopeful she will be able to get a job soon. She is tolerating medications well, denies side effects Visible on unit, behavior on unit in good control, and pleasant on approach   Cognitive Features That Contribute To Risk:  No gross cognitive deficits noted upon discharge. Is alert , attentive, and oriented x 3   Suicide Risk:  Mild:  Suicidal ideation of limited frequency, intensity, duration, and specificity.  There are no identifiable plans, no associated intent, mild dysphoria and related symptoms, good self-control (both objective and subjective assessment), few other risk factors, and identifiable protective factors, including available and accessible social  support.  Follow-up Information    [facility] Follow up on 11/27/2016.   Why:  Hospital discharge follow up and medications management w Alden Server PS on 10/15 at 11 AM. Your therapy  appointment with the counselor will also be discussed at this appointment. Please call the office if you need to cancel/reschedule. Thank you. Contact information: [address]  [place], [place] [address] Phone: [phone] Fax:  [phone]       [facility] Follow up.   Why:  Referral made for assistance w nutritoinal needs. Contact information: Merchandiser, retail and Nutrition Program [facility] [address] [place] [address] Phone [phone] Fax:  [phone]       [facility] Follow up.   Specialty:  Family Medicine Why:  Please stop at the [facility] to pick up a box of food that is being held for you at the front desk. Practice is closed for lunch 12:30 - 1:30. Contact information: [address] [id number] mc [place] [address] [phone]          Plan Of Care/Follow-up recommendations:  Activity:  as tolerated Diet:  Regular Tests:  NA Other:  See below  Patient is expressing readiness for discharge and is leaving in good spirits Plans to return home Plans to follow up as above  She has an established PCP for management of medical issues .  Craige Cotta, MD 11/24/2016, 1:29 PM

## 2016-11-24 NOTE — [facility] Suicide Risk Assessment (Signed)
[facility] INPATIENT:  Family/Significant Other Suicide Prevention Education  Suicide Prevention Education:  Patient Refusal for Family/Significant Other Suicide Prevention Education: The patient Savannah Roy has refused to provide written consent for family/significant other to be provided Family/Significant Other Suicide Prevention Education during admission and/or prior to discharge.  Physician notified.  Pt identified her son as her support person, however, pt's son does not have a phone to be contacted on. SPE was discussed with the pt.   Jonathon Jordan, MSW, LCSWA  11/24/2016, 10:37 AM

## 2016-11-24 NOTE — Progress Notes (Signed)
D: Pt observed in the dayroom interaction with peer. Pt at the time of assessment denied any anxiety, depression, pain, SI, HI or AVH; "I am just ready to go home; I feel wonderful." Pt remained calm and cooperative. A: Medications offered as prescribed. Pt given the opportunity to ask questions and state concerns. Support, encouragement, and safe environment provided. R: Pt was med compliant. All patient's questions and concerns addressed. 15-minute safety checks continue. Safety checks continue. Pt attended wrap-up group.

## 2016-11-24 NOTE — Progress Notes (Signed)
  [facility] Adult Case Management Discharge Plan :  Will you be returning to the same living situation after discharge:  Yes,  pt returning home. At discharge, do you have transportation home?: Yes,  pt has access to trasportation. Do you have the ability to pay for your medications: Yes,  pt has insurance.  Release of information consent forms completed and in the chart;  Patient's signature needed at discharge.  Patient to Follow up at: Follow-up Information    [facility] Follow up on 11/27/2016.   Why:  Hospital discharge follow up and medications management w Alden Server PS on 10/15 at 11 AM. Your therapy appointment with the counselor will also be discussed at this appointment. Please call the office if you need to cancel/reschedule. Thank you. Contact information: [address]  [place], [place] [address] Phone: [phone] Fax:  [phone]       [facility] Follow up.   Why:  Referral made for assistance w nutritoinal needs. Contact information: Merchandiser, retail and Nutrition Program [facility] [address] [place] [address] Phone [phone] Fax:  [phone]       [facility] Follow up.   Specialty:  Family Medicine Why:  Please stop at the [facility] to pick up a box of food that is being held for you at the front desk. Practice is closed for lunch 12:30 - 1:30. Contact information: [address] [id number] mc [place] [address] [phone]          Next level of care provider has access to [facility] Link:yes  Safety Planning and Suicide Prevention discussed: Yes,  with pt.  Have you used any form of tobacco in the last 30 days? (Cigarettes, Smokeless Tobacco, Cigars, and/or Pipes): No  Has patient been referred to the Quitline?: N/A patient is not a smoker  Patient has been referred for addiction treatment: N/A  Jonathon Jordan, MSW, LCSWA 11/24/2016,  10:38 AM

## 2016-11-24 NOTE — Progress Notes (Signed)
Pt d/c from the hospital. All items returned. D/C instructions given and prescriptions given. Pt denies si and hi. 

## 2016-11-25 LAB — T3, FREE: T3 FREE: 2.7 pg/mL (ref 2.0–4.4)

## 2016-11-27 ENCOUNTER — Telehealth: Payer: Self-pay | Admitting: Physician Assistant

## 2016-11-27 ENCOUNTER — Ambulatory Visit: Payer: BLUE CROSS/BLUE SHIELD | Admitting: Physician Assistant

## 2016-11-27 NOTE — Progress Notes (Deleted)
Savannah Roy is a 56 y.o. female is here for Hospital follow up for Depression.  I acted as a Neurosurgeon for [employer], PA-C [employer], LPN  History of Present Illness:   No chief complaint on file.   Depression         This is a chronic problem.  The onset quality is gradual.    Health Maintenance Due  Topic Date Due  . MAMMOGRAM  02/03/2009  . COLONOSCOPY  02/03/2009  . PAP SMEAR  03/22/2015  . INFLUENZA VACCINE  09/13/2016    Past Medical History:  Diagnosis Date  . LVH (left ventricular hypertrophy) due to hypertensive disease 05/10/2016  . Shortness of breath 04/07/2016     Social History   Social History  . Marital status: Divorced    Spouse name: N/A  . Number of children: N/A  . Years of education: N/A   Occupational History  . Not on file.   Social History Main Topics  . Smoking status: Former Games developer  . Smokeless tobacco: Never Used  . Alcohol use Yes     Comment: occasionally  . Drug use: No  . Sexual activity: No   Other Topics Concern  . 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 ENDOMETRIAL ABLATION W/ NOVASURE  2007  . HERNIA REPAIR  1985  . KNEE ARTHROSCOPY  2005    Family History  Problem Relation Age of Onset  . Cancer Mother   . Heart attack Mother   . CVA Mother   . Thyroid disease Mother   . Kidney failure Father   . Thyroid disease Sister   . Heart failure Maternal Grandmother     PMHx, SurgHx, SocialHx, FamHx, Medications, and Allergies were reviewed in the Visit Navigator and updated as appropriate.   Patient Active Problem List   Diagnosis Date Noted  . Severe episode of recurrent major depressive disorder, without psychotic features (HCC) 11/21/2016  . LVH (left ventricular hypertrophy) due to hypertensive disease 05/10/2016  . Shortness of  breath 04/07/2016  . Chest pain 03/24/2016  . SEBACEOUS CYST, BREAST 08/14/2007  . CELLULITIS 11/06/2006  . BMI 60.0-69.9, adult (HCC) 05/16/2006  . Essential hypertension 05/16/2006  . OLIGOMENORRHEA 05/16/2006    Social History  Substance Use Topics  . Smoking status: Former Games developer  . Smokeless tobacco: Never Used  . Alcohol use Yes     Comment: occasionally    Current Medications and Allergies:    Current Outpatient Prescriptions:  .  amLODipine (NORVASC) 5 MG tablet, Take 1 tablet (5 mg total) by mouth daily. For high blood pressure, Disp: 30 tablet, Rfl: 0 .  hydrochlorothiazide (HYDRODIURIL) 25 MG tablet, Take 1 tablet (25 mg total) by mouth daily. For high blood pressure, Disp: 30 tablet, Rfl: 0 .  hydrOXYzine (ATARAX/VISTARIL) 25 MG tablet, Take 1 tablet (25 mg total) by mouth every 6 (six) hours as needed for anxiety., Disp: 30 tablet, Rfl: 0 .  lisinopril (PRINIVIL,ZESTRIL) 20 MG tablet, Take 1 tablet (20 mg total) by mouth daily. For high blood pressure, Disp: 30 tablet, Rfl: 0 .  rosuvastatin (CRESTOR) 20 MG tablet, Take 1 tablet (20 mg total) by mouth daily at 6 PM. For high cholesterol, Disp: 30 tablet, Rfl: 0 .  traZODone (DESYREL) 50  MG tablet, Take 1 tablet (50 mg total) by mouth at bedtime as needed for sleep., Disp: 30 tablet, Rfl: 0 .  venlafaxine XR (EFFEXOR-XR) 75 MG 24 hr capsule, Take 1 capsule (75 mg total) by mouth daily with breakfast. For mood control, Disp: 30 capsule, Rfl: 0  Allergies  Allergen Reactions  . Erythromycin     REACTION: rash    Review of Systems   Review of Systems  Psychiatric/Behavioral: Positive for depression.    Vitals:  There were no vitals filed for this visit.   There is no height or weight on file to calculate BMI.   Physical Exam:    Physical Exam   Assessment and Plan:    There are no diagnoses linked to this encounter.  . Reviewed expectations re: course of current medical issues. . Discussed  self-management of symptoms. . Outlined signs and symptoms indicating need for more acute intervention. . Patient verbalized understanding and all questions were answered. . See orders for this visit as documented in the electronic medical record. . Patient received an After Visit Summary.  CMA or LPN served as scribe during this visit. History, Physical, and Plan performed by medical provider. Documentation and orders reviewed and attested to.  Jarold Motto, PA-C [place] 11/27/2016  Follow-up: No Follow-up on file.

## 2016-11-27 NOTE — Telephone Encounter (Signed)
Left message on voicemail to call office.  

## 2016-11-27 NOTE — Telephone Encounter (Signed)
Patient was no show at appointment for f/u of [facility] hospitalization for suicidal ideation.  Please attempt to call patient and inquire regarding her no show.  Jarold Motto PA-C 11/27/16

## 2016-12-11 NOTE — Telephone Encounter (Signed)
Left message on voicemail calling to see how you are doing. Please call office.

## 2016-12-15 NOTE — Telephone Encounter (Signed)
I sent pt a My Chart message today to see if she responds.

## 2017-01-19 ENCOUNTER — Other Ambulatory Visit: Payer: Self-pay

## 2017-01-19 ENCOUNTER — Other Ambulatory Visit: Payer: Self-pay | Admitting: Physician Assistant

## 2017-01-19 MED ORDER — VENLAFAXINE HCL ER 75 MG PO CP24: 75.0000 mg | ORAL_CAPSULE | Freq: Every day | ORAL | 1 refills | Status: DC

## 2017-01-19 NOTE — Telephone Encounter (Signed)
Rx has been refilled.  Patient came to office worried that her medication was not going to be filled before the end of the day.

## 2017-01-19 NOTE — Telephone Encounter (Signed)
Copied from CRM [phone]. Topic: Quick Communication - Rx Refill/Question >> Jan 19, 2017  2:13 PM Terisa Starraylor, Brittany L wrote: Has the patient contacted their pharmacy?yes twice last week and no response from the office for her meds  Preferred Pharmacy (with phone number or street name): [facility] [address] [place]   Agent: Please be advised that RX refills may take up to 3 business days. We ask that you follow-up with your pharmacy.  EFFEXOR-XR 75mg 

## 2017-03-30 ENCOUNTER — Other Ambulatory Visit: Payer: Self-pay | Admitting: Physician Assistant

## 2017-06-12 IMAGING — NM NM MISC PROCEDURE
9 series · 54 of 54 positions shown · non-contrast
Comparison: none

[Series 1: wbr_s-proj_st wbr stress-gsp · 6.40mm/px · 6 of 512 frames shown]
[frame 43/512]
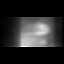
[frame 128/512]
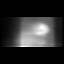
[frame 214/512]
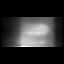
[frame 299/512]
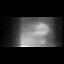
[frame 384/512]
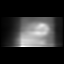
[frame 470/512]
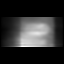

[Series 1: wbr stress-gsp · 6.40mm/px · 6 of 499 frames shown]
[frame 42/499  full-range]
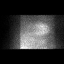
[frame 125/499  full-range]
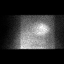
[frame 208/499  full-range]
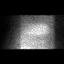
[frame 291/499  full-range]
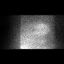
[frame 374/499  full-range]
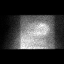
[frame 458/499  full-range]
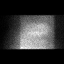

[Series 1: stress sax · 6.4mm · 6.40mm/px · 6 of 22 frames shown]
[frame 2/22]
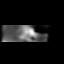
[frame 6/22]
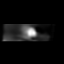
[frame 10/22]
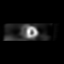
[frame 13/22]
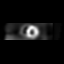
[frame 17/22]
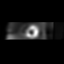
[frame 21/22]
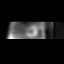

[Series 1: stress sax gs · 6.4mm · 6.40mm/px · 6 of 176 frames shown]
[frame 15/176]
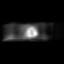
[frame 44/176]
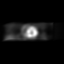
[frame 74/176]
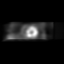
[frame 103/176]
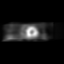
[frame 132/176]
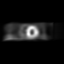
[frame 162/176]
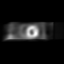

[Series 2: wbr_s-proj_st wbr stress-sum-em · 6.40mm/px · 6 of 64 frames shown]
[frame 6/64]
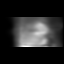
[frame 16/64]
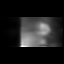
[frame 27/64]
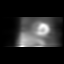
[frame 38/64]
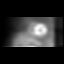
[frame 48/64]
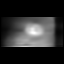
[frame 59/64]
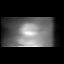

[Series 2: wbr stress-sum-em · 6.40mm/px · 6 of 63 frames shown]
[frame 6/63]
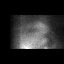
[frame 16/63]
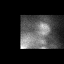
[frame 27/63]
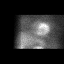
[frame 37/63]
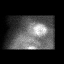
[frame 48/63]
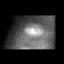
[frame 58/63]
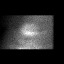

[Series 3: wbr_r-proj_st wbr rest · 6.40mm/px · 6 of 64 frames shown]
[frame 6/64]
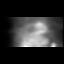
[frame 16/64]
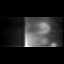
[frame 27/64]
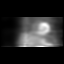
[frame 38/64]
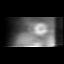
[frame 48/64]
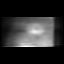
[frame 59/64]
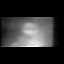

[Series 3: wbr rest · 6.40mm/px · 6 of 64 frames shown]
[frame 6/64]
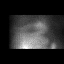
[frame 16/64]
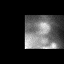
[frame 27/64]
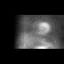
[frame 38/64]
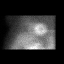
[frame 48/64]
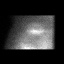
[frame 59/64]
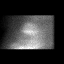

[Series 3: rest sax · 6.4mm · 6.40mm/px · 6 of 22 frames shown]
[frame 2/22]
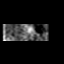
[frame 6/22]
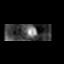
[frame 10/22]
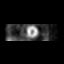
[frame 13/22]
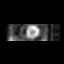
[frame 17/22]
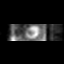
[frame 21/22]
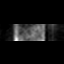

[54 of 54 positions shown; findings below may reference images not displayed]

Canned report from images found in remote index.

Refer to host system for actual result text.

## 2017-10-01 ENCOUNTER — Encounter: Payer: Self-pay | Admitting: Physician Assistant

## 2017-10-01 ENCOUNTER — Ambulatory Visit (INDEPENDENT_AMBULATORY_CARE_PROVIDER_SITE_OTHER): Payer: BLUE CROSS/BLUE SHIELD | Admitting: Physician Assistant

## 2017-10-01 VITALS — BP 142/98 | HR 91 | Temp 98.5°F | Ht 68.0 in | Wt 320.4 lb

## 2017-10-01 DIAGNOSIS — E785 Hyperlipidemia, unspecified: Secondary | ICD-10-CM

## 2017-10-01 DIAGNOSIS — K5900 Constipation, unspecified: Secondary | ICD-10-CM

## 2017-10-01 DIAGNOSIS — F332 Major depressive disorder, recurrent severe without psychotic features: Secondary | ICD-10-CM

## 2017-10-01 DIAGNOSIS — B360 Pityriasis versicolor: Secondary | ICD-10-CM

## 2017-10-01 DIAGNOSIS — E669 Obesity, unspecified: Secondary | ICD-10-CM | POA: Insufficient documentation

## 2017-10-01 DIAGNOSIS — I10 Essential (primary) hypertension: Secondary | ICD-10-CM

## 2017-10-01 LAB — COMPREHENSIVE METABOLIC PANEL
ALT: 37 U/L — AB (ref 0–35)
AST: 41 U/L — ABNORMAL HIGH (ref 0–37)
Albumin: 3.8 g/dL (ref 3.5–5.2)
Alkaline Phosphatase: 103 U/L (ref 39–117)
BUN: 18 mg/dL (ref 6–23)
CO2: 27 meq/L (ref 19–32)
Calcium: 9.1 mg/dL (ref 8.4–10.5)
Chloride: 107 mEq/L (ref 96–112)
Creatinine, Ser: 1.11 mg/dL (ref 0.40–1.20)
GFR: 53.54 mL/min — ABNORMAL LOW (ref >60.00)
GLUCOSE: 111 mg/dL — AB (ref 70–99)
Potassium: 4.1 mEq/L (ref 3.5–5.1)
SODIUM: 142 meq/L (ref 135–145)
Total Bilirubin: 1.3 mg/dL — ABNORMAL HIGH (ref 0.2–1.2)
Total Protein: 6.3 g/dL (ref 6.0–8.3)

## 2017-10-01 LAB — LIPID PANEL
CHOL/HDL RATIO: 6
Cholesterol: 203 mg/dL — ABNORMAL HIGH (ref 0–200)
HDL: 34.8 mg/dL — ABNORMAL LOW (ref >39.00)
LDL Cholesterol: 140 mg/dL — ABNORMAL HIGH (ref 0–99)
NONHDL: 168.48
Triglycerides: 144 mg/dL (ref 0.0–149.0)
VLDL: 28.8 mg/dL (ref 0.0–40.0)

## 2017-10-01 MED ORDER — DESVENLAFAXINE SUCCINATE ER 100 MG PO TB24: 100.0000 mg | ORAL_TABLET | Freq: Every day | ORAL | 3 refills | Status: DC

## 2017-10-01 MED ORDER — LISINOPRIL 20 MG PO TABS: 20.0000 mg | ORAL_TABLET | Freq: Every day | ORAL | 0 refills | Status: DC

## 2017-10-01 MED ORDER — KETOCONAZOLE 2 % EX CREA: TOPICAL_CREAM | CUTANEOUS | 0 refills | Status: DC

## 2017-10-01 MED ORDER — POLYETHYLENE GLYCOL 3350 17 GM/SCOOP PO POWD: 17.0000 g | Freq: Every day | ORAL | 1 refills | Status: DC

## 2017-10-01 NOTE — Progress Notes (Deleted)
Patient: Savannah Roy MRN: [id number] DOB: Jan 28, 1961 PCP: Jarold MottoWorley, Samantha, PA     Subjective:  Chief Complaint  Patient presents with  . follw up meds    HPI: The patient is a 57 y.o. female who presents today for ***  Review of Systems  Constitutional: Negative for activity change, appetite change and fatigue.  Respiratory: Negative for shortness of breath.   Cardiovascular: Negative for chest pain.  Gastrointestinal: Negative for abdominal pain and nausea.  Neurological: Negative for dizziness and headaches.  Psychiatric/Behavioral: Positive for sleep disturbance. The patient is not nervous/anxious.     Allergies Patient is allergic to erythromycin.  Past Medical History Patient  has a past medical history of LVH (left ventricular hypertrophy) due to hypertensive disease (05/10/2016) and Shortness of breath (04/07/2016).  Surgical History Patient  has a past surgical history that includes Hernia repair (1985); Endometrial ablation w/ novasure (2007); and Knee arthroscopy (2005).  Family History Pateint's family history includes CVA in her mother; Cancer in her mother; Heart attack in her mother; Heart failure in her maternal grandmother; Kidney failure in her father; Thyroid disease in her mother and sister.  Social History Patient  reports that she has quit smoking. She has never used smokeless tobacco. She reports that she drinks alcohol. She reports that she does not use drugs.    Objective: Vitals:   10/01/17 1035  BP: (!) 142/98  Pulse: 91  Temp: 98.5 F (36.9 C)  TempSrc: Oral  SpO2: 97%  Weight: (!) 320 lb 6.4 oz (145.3 kg)  Height: 5\' 8"  (1.727 m)    Body mass index is 48.72 kg/m.  Physical Exam     Assessment/plan:      No follow-ups on file.     @AWME @ 10/01/2017

## 2017-10-01 NOTE — Progress Notes (Signed)
Savannah Roy is a 57 y.o. female here for a follow up of a pre-existing problem.  History of Present Illness:   Chief Complaint  Patient presents with  . follw up meds    HPI   Anxiety/Depression Currently on Effexor 75 mg daily. She denies SI/HI. She states that she is struggling financially. She would like to get back to working again but has significant anxiety about this. She has joined the Medication Assistance Program through the Health Department and they are likely to have lower costs for Pristiq. She is interested in transitioning to this medication.  Depression screen [facility] 2/9 10/01/2017 11/20/2016  Decreased Interest 0 3  Down, Depressed, Hopeless 0 3  PHQ - 2 Score 0 6  Altered sleeping 1 0  Tired, decreased energy 1 3  Change in appetite 1 3  Feeling bad or failure about yourself  1 3  Trouble concentrating 1 3  Moving slowly or fidgety/restless 0 2  Suicidal thoughts 0 2  PHQ-9 Score 5 22  Difficult doing work/chores Somewhat difficult Very difficult   GAD 7 : Generalized Anxiety Score 10/01/2017  Nervous, Anxious, on Edge 0  Control/stop worrying 0  Worry too much - different things 1  Trouble relaxing 0  Restless 0  Easily annoyed or irritable 1  Afraid - awful might happen 1  Total GAD 7 Score 3  Anxiety Difficulty Somewhat difficult   HTN Currently taking Amlodopine 5 mg and Lisinopril 20 mg. At home blood pressures are relatively well controlled. Patient denies chest pain, SOB, blurred vision, dizziness, unusual headaches, lower leg swelling. Patient is compliant with medication. Denies excessive caffeine intake, stimulant usage, excessive alcohol intake, or increase in salt consumption.  She has not yet taken her blood pressure medication today. BP Readings from Last 3 Encounters:  10/01/17 (!) 142/98  11/21/16 (!) 152/85  11/20/16 132/80   HLD She was on Crestor 20 mg for a while but stopped taking it because she could no longer afford it. Lab  Results  Component Value Date   CHOL 226 (H) 05/17/2016   HDL 29.30 (L) 05/17/2016   LDLCALC 159 (H) 05/17/2016   TRIG 185.0 (H) 05/17/2016   CHOLHDL 8 05/17/2016   Constipation History of this. Has to manually disimpact self at times. Typically has Type 1 Bristol Stools. Hemorrhoids at times. Used to take stool softeners but didn't think that they helped.  Skin Discoloration She reports that she has had some discoloration in the skin between her inner thighs. She has noticed a slight odor to this area as well. Itchy at times.   Past Medical History:  Diagnosis Date  . LVH (left ventricular hypertrophy) due to hypertensive disease 05/10/2016  . Shortness of breath 04/07/2016     Social History   Socioeconomic History  . Marital status: Divorced    Spouse name: Not on file  . Number of children: Not on file  . Years of education: Not on file  . Highest education level: Not on file  Occupational History  . Not on file  Social Needs  . Financial resource strain: Not on file  . Food insecurity:    Worry: Not on file    Inability: Not on file  . Transportation needs:    Medical: Not on file    Non-medical: Not on file  Tobacco Use  . Smoking status: Former Games developer  . Smokeless tobacco: Never Used  Substance and Sexual Activity  . Alcohol use: Yes  Comment: occasionally  . Drug use: No  . Sexual activity: Never  Lifestyle  . Physical activity:    Days per week: Not on file    Minutes per session: Not on file  . Stress: Not on file  Relationships  . Social connections:    Talks on phone: Not on file    Gets together: Not on file    Attends religious service: Not on file    Active member of club or organization: Not on file    Attends meetings of clubs or organizations: Not on file    Relationship status: Not on file  . Intimate partner violence:    Fear of current or ex partner: Not on file    Emotionally abused: Not on file    Physically abused: Not on file     Forced sexual activity: Not on file  Other Topics Concern  . Not on file  Social History Narrative   Divorced   Works at [employer]   Went to [employer] for 2 years college   Enjoys reading and traveling    Past Surgical History:  Procedure Laterality Date  . ENDOMETRIAL ABLATION W/ NOVASURE  2007  . HERNIA REPAIR  1985  . KNEE ARTHROSCOPY  2005    Family History  Problem Relation Age of Onset  . Cancer Mother   . Heart attack Mother   . CVA Mother   . Thyroid disease Mother   . Kidney failure Father   . Thyroid disease Sister   . Heart failure Maternal Grandmother     Allergies  Allergen Reactions  . Erythromycin     REACTION: rash    Current Medications:   Current Outpatient Medications:  .  venlafaxine XR (EFFEXOR-XR) 75 MG 24 hr capsule, TAKE 1 CAPSULE (75 MG TOTAL) BY MOUTH DAILY WITH BREAKFAST. FOR MOOD CONTROL, Disp: 30 capsule, Rfl: 0 .  amLODipine (NORVASC) 5 MG tablet, Take 1 tablet (5 mg total) by mouth daily. For high blood pressure, Disp: 30 tablet, Rfl: 0 .  desvenlafaxine (PRISTIQ) 100 MG 24 hr tablet, Take 1 tablet (100 mg total) by mouth daily., Disp: 30 tablet, Rfl: 3 .  hydrochlorothiazide (HYDRODIURIL) 25 MG tablet, Take 1 tablet (25 mg total) by mouth daily. For high blood pressure, Disp: 30 tablet, Rfl: 0 .  hydrOXYzine (ATARAX/VISTARIL) 25 MG tablet, Take 1 tablet (25 mg total) by mouth every 6 (six) hours as needed for anxiety., Disp: 30 tablet, Rfl: 0 .  ketoconazole (NIZORAL) 2 % cream, Apply small amount to affected area daily, Disp: 30 g, Rfl: 0 .  lisinopril (PRINIVIL,ZESTRIL) 20 MG tablet, Take 1 tablet (20 mg total) by mouth daily. For high blood pressure, Disp: 90 tablet, Rfl: 0 .  polyethylene glycol powder (GLYCOLAX/MIRALAX) powder, Take 17 g by mouth daily., Disp: 3350 g, Rfl: 1 .  rosuvastatin (CRESTOR) 20 MG tablet, Take 1 tablet (20 mg total) by mouth daily at 6 PM. For high cholesterol (Patient not taking: Reported on  10/01/2017), Disp: 30 tablet, Rfl: 0 .  traZODone (DESYREL) 50 MG tablet, Take 1 tablet (50 mg total) by mouth at bedtime as needed for sleep., Disp: 30 tablet, Rfl: 0   Review of Systems:   ROS  Negative unless otherwise specified per HPI.  Vitals:   Vitals:   10/01/17 1035  BP: (!) 142/98  Pulse: 91  Temp: 98.5 F (36.9 C)  TempSrc: Oral  SpO2: 97%  Weight: (!) 320 lb 6.4 oz (145.3 kg)  Height: 5\' 8"  (1.727 m)     Body mass index is 48.72 kg/m.  Physical Exam:   Physical Exam  Constitutional: She appears well-developed. She is cooperative.  Non-toxic appearance. She does not have a sickly appearance. She does not appear ill. No distress.  Cardiovascular: Normal rate, regular rhythm, S1 normal, S2 normal, normal heart sounds and normal pulses.  No LE edema  Pulmonary/Chest: Effort normal and breath sounds normal.  Neurological: She is alert. GCS eye subscore is 4. GCS verbal subscore is 5. GCS motor subscore is 6.  Skin: Skin is warm, dry and intact.  Multiple scattered hyperpigmented patches in bilateral thighs, slight odor  Psychiatric: She has a normal mood and affect. Her speech is normal and behavior is normal.  Nursing note and vitals reviewed.   Assessment and Plan:    Jasmine DecemberSharon was seen today for follw up meds.  Diagnoses and all orders for this visit:  Severe episode of recurrent major depressive disorder, without psychotic features (HCC) Mostly controlled. I discussed with patient that if they develop any SI, to tell someone immediately and seek medical attention. She would like to switch to Pristiq 2/2 cost. Will have her discontinue Effexor tomorrow and start 100 mg Pristiq. She is interested in a counselor but has limited financial resources. Will recommend [facility], [facility], or locally, [facility].  Essential hypertension Continue Lisinopril 20 mg and Norvasc 5 mg. If becomes uncontrolled, will consider increasing these prior to adding third medication.  Encouraged compliance. Follow-up within 6 months. -     Comprehensive metabolic panel -     Lipid panel  Hyperlipidemia, unspecified hyperlipidemia type Re-check lipid panel today. Per her medication assistance program, we could possibly try Caduet (combo pill of Amlodopine and Atorvastatin) to help with cost savings. Will determine after lipid panel results.  Tinea versicolor Provided patient with ketoconazole cream. Apply daily, follow-up if symptoms worsen or persist.  Constipation Discussed use of Miralax daily with goal of 1 soft bowel movement daily. Follow-up if symptoms worsen or persist.  Other orders -     desvenlafaxine (PRISTIQ) 100 MG 24 hr tablet; Take 1 tablet (100 mg total) by mouth daily. -     ketoconazole (NIZORAL) 2 % cream; Apply small amount to affected area daily -     lisinopril (PRINIVIL,ZESTRIL) 20 MG tablet; Take 1 tablet (20 mg total) by mouth daily. For high blood pressure -     polyethylene glycol powder (GLYCOLAX/MIRALAX) powder; Take 17 g by mouth daily.    . Reviewed expectations re: course of current medical issues. . Discussed self-management of symptoms. . Outlined signs and symptoms indicating need for more acute intervention. . Patient verbalized understanding and all questions were answered. . See orders for this visit as documented in the electronic medical record. . Patient received an After-Visit Summary.    Jarold MottoSamantha Penne Rosenstock, PA-C

## 2017-10-01 NOTE — Patient Instructions (Signed)
It was great to see you!  Continue Lisinopril 20 mg an Norvasc 5 mg.  We will contact you regarding your lab results and any changes we will make.  Stop Effexor today. Start Pristiq tomorrow. If you develop any suicidal or homicidal thoughts please to go the ER.  Please use ketoconazole cream to the areas on your thighs.  Let's follow-up at least in 3 months, sooner if you have concerns.  Take care,  Jarold MottoSamantha Baylyn Sickles PA-C

## 2017-10-02 ENCOUNTER — Telehealth: Payer: Self-pay

## 2017-10-02 ENCOUNTER — Other Ambulatory Visit: Payer: Self-pay | Admitting: Physician Assistant

## 2017-10-02 ENCOUNTER — Encounter: Payer: Self-pay | Admitting: *Deleted

## 2017-10-02 MED ORDER — AMLODIPINE-ATORVASTATIN 5-20 MG PO TABS: 1.0000 | ORAL_TABLET | Freq: Every day | ORAL | 0 refills | Status: DC

## 2017-10-02 MED ORDER — KETOCONAZOLE 2 % EX CREA: TOPICAL_CREAM | CUTANEOUS | 0 refills | Status: DC

## 2017-10-02 NOTE — Telephone Encounter (Signed)
[facility] And gave verbal orders to change the Disp for Miralax to 238 grams per Jarold MottoSamantha Worley. Crystal verbalized understanding.    Copied from CRM [phone]. Topic: General - Other >> Oct 01, 2017  4:16 PM Gaynelle AduPoole, Shalonda wrote: Reason for CRM: Crystal- [facility] is calling to request a vebal call to change the Disp- to 238 grams. Crystal contact number is [phone]. Please advise

## 2017-11-29 ENCOUNTER — Encounter: Payer: Self-pay | Admitting: Physician Assistant

## 2017-11-29 ENCOUNTER — Ambulatory Visit: Payer: Self-pay | Admitting: Physician Assistant

## 2017-11-29 VITALS — BP 138/80 | HR 79 | Temp 98.4°F | Ht 68.0 in | Wt 334.5 lb

## 2017-11-29 DIAGNOSIS — I10 Essential (primary) hypertension: Secondary | ICD-10-CM

## 2017-11-29 DIAGNOSIS — R7309 Other abnormal glucose: Secondary | ICD-10-CM

## 2017-11-29 DIAGNOSIS — L989 Disorder of the skin and subcutaneous tissue, unspecified: Secondary | ICD-10-CM

## 2017-11-29 LAB — POCT GLYCOSYLATED HEMOGLOBIN (HGB A1C): Hemoglobin A1C: 4.9 % (ref 4.0–5.6)

## 2017-11-29 MED ORDER — MUPIROCIN 2 % EX OINT: TOPICAL_OINTMENT | CUTANEOUS | 0 refills | Status: DC

## 2017-11-29 MED ORDER — AMLODIPINE-ATORVASTATIN 10-20 MG PO TABS: 1.0000 | ORAL_TABLET | Freq: Every day | ORAL | 1 refills | Status: DC

## 2017-11-29 MED ORDER — DOXYCYCLINE HYCLATE 100 MG PO TABS: 100.0000 mg | ORAL_TABLET | Freq: Two times a day (BID) | ORAL | 0 refills | Status: DC

## 2017-11-29 MED ORDER — NYSTATIN 100000 UNIT/GM EX POWD: Freq: Four times a day (QID) | CUTANEOUS | 0 refills | Status: DC

## 2017-11-29 NOTE — Progress Notes (Signed)
Savannah Roy is a 57 y.o. female here for a new problem.  I acted as a Neurosurgeon for [employer], PA-C Corky Mull, LPN  History of Present Illness:   Chief Complaint  Patient presents with  . Recurrent Skin Infections    Female GU Problem  Primary symptoms comment: Pt c/o a boil at top of mons pubis x 3 months. This is a new problem. Episode onset: Three months. The problem occurs constantly. The problem has been gradually worsening. The pain is moderate (with walking). The problem affects both sides. She is not pregnant. Pertinent negatives include no chills, diarrhea, fever or nausea. Associated symptoms comments: Boil opens and drains light pink bloody drainage. . The vaginal discharge was normal. There has been no bleeding. She has not been passing clots. She has not been passing tissue. The symptoms are aggravated by activity. She has tried acetaminophen (Warms showers, peroxide) for the symptoms. The treatment provided mild relief. She is not sexually active. No, her partner does not have an STD. She uses nothing for contraception. Menstrual history: No menstal periods since 2009 Ablation done. Her past medical history is significant for a gynecological surgery.   Wt Readings from Last 5 Encounters:  11/29/17 (!) 334 lb 8 oz (151.7 kg)  10/01/17 (!) 320 lb 6.4 oz (145.3 kg)  11/20/16 248 lb (112.5 kg)  11/20/16 (!) 348 lb 6.1 oz (158 kg)  05/17/16 (!) 376 lb 4 oz (170.7 kg)   HTN Currently taking Hydrochlorothiazide 25 mg and Amlodopine 5 mg - Atorvastatin 20 mg. Patient denies chest pain, SOB, blurred vision, dizziness, unusual headaches, lower leg swelling. Patient is compliant with medication. Denies excessive caffeine intake, stimulant usage, excessive alcohol intake, or increase in salt consumption.'  Past Medical History:  Diagnosis Date  . LVH (left ventricular hypertrophy) due to hypertensive disease 05/10/2016  . Shortness of breath 04/07/2016     Social History    Socioeconomic History  . Marital status: Divorced    Spouse name: Not on file  . Number of children: Not on file  . Years of education: Not on file  . Highest education level: Not on file  Occupational History  . Not on file  Social Needs  . Financial resource strain: Not on file  . Food insecurity:    Worry: Not on file    Inability: Not on file  . Transportation needs:    Medical: Not on file    Non-medical: Not on file  Tobacco Use  . Smoking status: Former Games developer  . Smokeless tobacco: Never Used  Substance and Sexual Activity  . Alcohol use: Yes    Comment: occasionally  . Drug use: No  . Sexual activity: Never  Lifestyle  . Physical activity:    Days per week: Not on file    Minutes per session: Not on file  . Stress: Not on file  Relationships  . Social connections:    Talks on phone: Not on file    Gets together: Not on file    Attends religious service: Not on file    Active member of club or organization: Not on file    Attends meetings of clubs or organizations: Not on file    Relationship status: Not on file  . Intimate partner violence:    Fear of current or ex partner: Not on file    Emotionally abused: Not on file    Physically abused: Not on file    Forced sexual activity: Not  on file  Other Topics Concern  . 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 ENDOMETRIAL ABLATION W/ NOVASURE  2007  . HERNIA REPAIR  1985  . KNEE ARTHROSCOPY  2005    Family History  Problem Relation Age of Onset  . Cancer Mother   . Heart attack Mother   . CVA Mother   . Thyroid disease Mother   . Kidney failure Father   . Thyroid disease Sister   . Heart failure Maternal Grandmother     Allergies  Allergen Reactions  . Erythromycin     REACTION: rash    Current Medications:   Current  Outpatient Medications:  .  desvenlafaxine (PRISTIQ) 100 MG 24 hr tablet, Take 1 tablet (100 mg total) by mouth daily., Disp: 30 tablet, Rfl: 3 .  hydrochlorothiazide (HYDRODIURIL) 25 MG tablet, Take 1 tablet (25 mg total) by mouth daily. For high blood pressure, Disp: 30 tablet, Rfl: 0 .  hydrOXYzine (ATARAX/VISTARIL) 25 MG tablet, Take 1 tablet (25 mg total) by mouth every 6 (six) hours as needed for anxiety., Disp: 30 tablet, Rfl: 0 .  ketoconazole (NIZORAL) 2 % cream, Apply small amount to affected area daily, Disp: 30 g, Rfl: 0 .  polyethylene glycol powder (GLYCOLAX/MIRALAX) powder, Take 17 g by mouth daily., Disp: 3350 g, Rfl: 1 .  amlodipine-atorvastatin (CADUET) 10-20 MG tablet, Take 1 tablet by mouth daily., Disp: 30 tablet, Rfl: 1 .  doxycycline (VIBRA-TABS) 100 MG tablet, Take 1 tablet (100 mg total) by mouth 2 (two) times daily., Disp: 20 tablet, Rfl: 0 .  mupirocin ointment (BACTROBAN) 2 %, Apply to affected area 1 to 2 times daily, Disp: 22 g, Rfl: 0 .  nystatin (MYCOSTATIN/NYSTOP) powder, Apply topically 4 (four) times daily., Disp: 15 g, Rfl: 0   Review of Systems:   Review of Systems  Constitutional: Negative for chills and fever.  Gastrointestinal: Negative for diarrhea and nausea.    Vitals:   Vitals:   11/29/17 1101 11/29/17 1122  BP: 140/78 138/80  Pulse: 79   Temp: 98.4 F (36.9 C)   TempSrc: Oral   SpO2: 97%   Weight: (!) 334 lb 8 oz (151.7 kg)   Height: 5\' 8"  (1.727 m)      Body mass index is 50.86 kg/m.  Physical Exam:   Physical Exam  Constitutional: She appears well-developed. She is cooperative.  Non-toxic appearance. She does not have a sickly appearance. She does not appear ill. No distress.  Cardiovascular: Normal rate, regular rhythm, S1 normal, S2 normal, normal heart sounds and normal pulses.  No LE edema  Pulmonary/Chest: Effort normal and breath sounds normal.  Neurological: She is alert. GCS eye subscore is 4. GCS verbal subscore is 5.  GCS motor subscore is 6.  Skin: Skin is warm, dry and intact.  Numerous scattered pustules on mons pubis with scant serosanguinous discharge  Erythematous macerated skin under pannus      Psychiatric: She has a normal mood and affect. Her speech is normal and behavior is normal.  Nursing note and vitals reviewed.  Results for orders placed or performed in visit on 11/29/17  POCT glycosylated hemoglobin (Hb A1C)  Result Value Ref Range   Hemoglobin A1C 4.9 4.0 - 5.6 %     Assessment and Plan:  Stellah was seen today for recurrent skin infections.  Diagnoses and all orders for this visit:  Skin lesions Start oral doxycycline. Apply topical bactroban. Use nystatin powder on skin folds for yeast. Follow-up in 1 month.  Essential hypertension Uncontrolled. Continue HCTZ 25 mg and increase Caduet dosage to 10mg  norvasc + 20mg  lipitor. Follow-up in 1 month.  Elevated glucose History of elevated glucose. HgbA1c great today. -     POCT glycosylated hemoglobin (Hb A1C)  Other orders -     mupirocin ointment (BACTROBAN) 2 %; Apply to affected area 1 to 2 times daily -     doxycycline (VIBRA-TABS) 100 MG tablet; Take 1 tablet (100 mg total) by mouth 2 (two) times daily. -     nystatin (MYCOSTATIN/NYSTOP) powder; Apply topically 4 (four) times daily. -     amlodipine-atorvastatin (CADUET) 10-20 MG tablet; Take 1 tablet by mouth daily.    . Reviewed expectations re: course of current medical issues. . Discussed self-management of symptoms. . Outlined signs and symptoms indicating need for more acute intervention. . Patient verbalized understanding and all questions were answered. . See orders for this visit as documented in the electronic medical record. . Patient received an After-Visit Summary.  CMA or LPN served as scribe during this visit. History, Physical, and Plan performed by medical provider. The above documentation has been reviewed and is accurate and  complete.   Jarold Motto, PA-C

## 2017-11-29 NOTE — Patient Instructions (Addendum)
It was great to see you!  1. Start oral doxycycline antibiotic for your skin lesions 2. Apply topical bactroban ointment to your skin lesions 3. Apply nystatin powder to areas of skin irritation (such as under folds and under breast) 4. Let's change your blood pressure medication to an increased dose of Norvasc 10mg  - Lipitor 20mg  (Caduet 10-20.) 4. Call and make an appointment to see a therapist!!!!   If you ever have suicidal thoughts, please tell someone and go to the ER!  Take care,  Jarold Motto PA-C

## 2017-12-28 ENCOUNTER — Ambulatory Visit: Payer: Self-pay | Admitting: Physician Assistant

## 2017-12-31 ENCOUNTER — Ambulatory Visit: Payer: Self-pay | Admitting: Physician Assistant

## 2018-01-23 ENCOUNTER — Other Ambulatory Visit: Payer: Self-pay | Admitting: Physician Assistant

## 2018-02-25 ENCOUNTER — Other Ambulatory Visit: Payer: Self-pay | Admitting: Physician Assistant

## 2018-04-24 ENCOUNTER — Other Ambulatory Visit: Payer: Self-pay

## 2018-04-24 MED ORDER — DESVENLAFAXINE SUCCINATE ER 100 MG PO TB24: ORAL_TABLET | ORAL | 2 refills | Status: DC

## 2018-05-22 ENCOUNTER — Telehealth: Payer: Self-pay | Admitting: *Deleted

## 2018-05-22 ENCOUNTER — Other Ambulatory Visit: Payer: Self-pay | Admitting: Physician Assistant

## 2018-05-22 NOTE — Telephone Encounter (Signed)
Left detailed message on personal voicemail calling to schedule virtual visit for follow up with [facility]. Medication refill.

## 2018-05-27 NOTE — Telephone Encounter (Signed)
Left message on voicemail needs to schedule follow up with [facility] for blood pressure and medication refill.

## 2018-06-26 ENCOUNTER — Other Ambulatory Visit: Payer: Self-pay | Admitting: Physician Assistant

## 2019-01-17 ENCOUNTER — Encounter: Payer: Self-pay | Admitting: Physician Assistant

## 2019-01-17 ENCOUNTER — Ambulatory Visit (INDEPENDENT_AMBULATORY_CARE_PROVIDER_SITE_OTHER): Payer: Self-pay | Admitting: Physician Assistant

## 2019-01-17 ENCOUNTER — Other Ambulatory Visit: Payer: Self-pay

## 2019-01-17 VITALS — BP 160/82 | HR 72 | Temp 97.0°F | Wt 369.8 lb

## 2019-01-17 DIAGNOSIS — F332 Major depressive disorder, recurrent severe without psychotic features: Secondary | ICD-10-CM

## 2019-01-17 DIAGNOSIS — I10 Essential (primary) hypertension: Secondary | ICD-10-CM

## 2019-01-17 DIAGNOSIS — E785 Hyperlipidemia, unspecified: Secondary | ICD-10-CM

## 2019-01-17 LAB — COMPREHENSIVE METABOLIC PANEL
ALT: 54 U/L — ABNORMAL HIGH (ref 0–35)
AST: 64 U/L — ABNORMAL HIGH (ref 0–37)
Albumin: 3.7 g/dL (ref 3.5–5.2)
Alkaline Phosphatase: 89 U/L (ref 39–117)
BUN: 17 mg/dL (ref 6–23)
CO2: 29 mEq/L (ref 19–32)
Calcium: 8.5 mg/dL (ref 8.4–10.5)
Chloride: 106 mEq/L (ref 96–112)
Creatinine, Ser: 0.88 mg/dL (ref 0.40–1.20)
GFR: 65.56 mL/min (ref >60.00)
Glucose, Bld: 111 mg/dL — ABNORMAL HIGH (ref 70–99)
Potassium: 4 mEq/L (ref 3.5–5.1)
Sodium: 142 mEq/L (ref 135–145)
Total Bilirubin: 1.1 mg/dL (ref 0.2–1.2)
Total Protein: 6.6 g/dL (ref 6.0–8.3)

## 2019-01-17 LAB — LIPID PANEL
Cholesterol: 207 mg/dL — ABNORMAL HIGH (ref 0–200)
HDL: 31.8 mg/dL — ABNORMAL LOW (ref >39.00)
LDL Cholesterol: 147 mg/dL — ABNORMAL HIGH (ref 0–99)
NonHDL: 174.89
Total CHOL/HDL Ratio: 6
Triglycerides: 137 mg/dL (ref 0.0–149.0)
VLDL: 27.4 mg/dL (ref 0.0–40.0)

## 2019-01-17 MED ORDER — AMLODIPINE-ATORVASTATIN 10-20 MG PO TABS: 1.0000 | ORAL_TABLET | Freq: Every day | ORAL | 0 refills | Status: DC

## 2019-01-17 MED ORDER — DESVENLAFAXINE SUCCINATE ER 100 MG PO TB24: ORAL_TABLET | ORAL | 2 refills | Status: DC

## 2019-01-17 NOTE — Patient Instructions (Signed)
It was great to see you!  I will be in touch with your lab results.  Take care,  Jessamy Torosyan PA-C  

## 2019-01-17 NOTE — Progress Notes (Signed)
Savannah Roy is a 58 y.o. female here for a follow up of a pre-existing problem.  History of Present Illness:   Chief Complaint  Patient presents with  . Medication Refill    HPI   HTN Currently taking amlodpine 5 mg. At home blood pressure readings are: not checked. Patient denies chest pain, SOB, blurred vision, dizziness, unusual headaches, lower leg swelling. Patient is not compliant with medication -- has been out for several weeks. Denies excessive caffeine intake, stimulant usage, excessive alcohol intake. Does endorse high salt intake.  BP Readings from Last 3 Encounters:  01/17/19 (!) 160/82  11/29/17 138/80  10/01/17 (!) 142/98   HLD She has been without her Lipitor 20 mg for several weeks, this is in a combo form of her Caduet.  She has had weight gain, due to stress eating.  Denies any myalgias when she takes her medication.  Severe depression Was tolerating Pristiq 100 mg daily well, however she is almost out of this medication so she has been taking her medication sparingly.  Has taken about 2 capsules in the past 1 week.  She denies any suicidal or homicidal thoughts at this time.  Her depression typically results in her overeating.  Wt Readings from Last 5 Encounters:  01/17/19 (!) 369 lb 12.8 oz (167.7 kg)  11/29/17 (!) 334 lb 8 oz (151.7 kg)  10/01/17 (!) 320 lb 6.4 oz (145.3 kg)  11/20/16 248 lb (112.5 kg)  11/20/16 (!) 348 lb 6.1 oz (158 kg)   [facility] 01/17/2019 10/01/2017 11/20/2016  Decreased Interest 0 0 3  Down, Depressed, Hopeless 0 0 3  PHQ - 2 Score 0 0 6  Altered sleeping 1 1 0  Tired, decreased energy 1 1 3   Change in appetite 1 1 3   Feeling bad or failure about yourself  0 1 3  Trouble concentrating 1 1 3   Moving slowly or fidgety/restless 0 0 2  Suicidal thoughts 0 0 2  PHQ-9 Score 4 5 22   Difficult doing work/chores Not difficult at all Somewhat difficult Very difficult     Past Medical History:  Diagnosis Date  .  LVH (left ventricular hypertrophy) due to hypertensive disease 05/10/2016  . Shortness of breath 04/07/2016     Social History   Socioeconomic History  . Marital status: Divorced    Spouse name: Not on file  . Number of children: Not on file  . Years of education: Not on file  . Highest education level: Not on file  Occupational History  . Not on file  Social Needs  . Financial resource strain: Not on file  . Food insecurity    Worry: Not on file    Inability: Not on file  . Transportation needs    Medical: Not on file    Non-medical: Not on file  Tobacco Use  . Smoking status: Former Games developermoker  . Smokeless tobacco: Never Used  Substance and Sexual Activity  . Alcohol use: Yes    Comment: occasionally  . Drug use: No  . Sexual activity: Never  Lifestyle  . Physical activity    Days per week: Not on file    Minutes per session: Not on file  . Stress: Not on file  Relationships  . Social Musicianconnections    Talks on phone: Not on file    Gets together: Not on file    Attends religious service: Not on file    Active member of club or organization: Not  on file    Attends meetings of clubs or organizations: Not on file    Relationship status: Not on file  . Intimate partner violence    Fear of current or ex partner: Not on file    Emotionally abused: Not on file    Physically abused: Not on file    Forced sexual activity: Not on file  Other Topics Concern  . 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 ENDOMETRIAL ABLATION W/ NOVASURE  2007  . HERNIA REPAIR  1985  . KNEE ARTHROSCOPY  2005    Family History  Problem Relation Age of Onset  . Cancer Mother   . Heart attack Mother   . CVA Mother   . Thyroid disease Mother   . Kidney failure Father   . Thyroid disease Sister   . Heart failure Maternal  Grandmother     Allergies  Allergen Reactions  . Erythromycin     REACTION: rash    Current Medications:   Current Outpatient Medications:  .  amlodipine-atorvastatin (CADUET) 10-20 MG tablet, Take 1 tablet by mouth daily., Disp: 90 tablet, Rfl: 0 .  desvenlafaxine (PRISTIQ) 100 MG 24 hr tablet, TAKE 1 TABLET (100 MG) BY MOUTH DAILY, Disp: 90 tablet, Rfl: 2 .  polyethylene glycol powder (GLYCOLAX/MIRALAX) powder, Take 17 g by mouth daily., Disp: 3350 g, Rfl: 1   Review of Systems:   ROS  Negative unless otherwise specified per HPI.   Vitals:   Vitals:   01/17/19 1031  BP: (!) 160/82  Pulse: 72  Temp: (!) 97 F (36.1 C)  TempSrc: Temporal  SpO2: 97%  Weight: (!) 369 lb 12.8 oz (167.7 kg)     Body mass index is 56.23 kg/m.  Physical Exam:   Physical Exam Vitals signs and nursing note reviewed.  Constitutional:      General: She is not in acute distress.    Appearance: She is well-developed. She is not ill-appearing or toxic-appearing.  Cardiovascular:     Rate and Rhythm: Normal rate and regular rhythm.     Pulses: Normal pulses.     Heart sounds: Normal heart sounds, S1 normal and S2 normal.     Comments: No LE edema Pulmonary:     Effort: Pulmonary effort is normal.     Breath sounds: Normal breath sounds.  Skin:    General: Skin is warm and dry.  Neurological:     Mental Status: She is alert.     GCS: GCS eye subscore is 4. GCS verbal subscore is 5. GCS motor subscore is 6.  Psychiatric:        Speech: Speech normal.        Behavior: Behavior normal. Behavior is cooperative.     Results for orders placed or performed in visit on 11/29/17  POCT glycosylated hemoglobin (Hb A1C)  Result Value Ref Range   Hemoglobin A1C 4.9 4.0 - 5.6 %    Assessment and Plan:   [name] was seen today for medication refill.  Diagnoses and all orders for this visit:  Essential hypertension Uncontrolled due to medication noncompliance.  I have refilled her  medication, Caduet 10-20.  I recommended that she start to check blood pressures regularly, and let us know if symptoms develop.  I did give her  a 1 year supply due to lack of health insurance and ability to follow-up regularly, however I did recommend that she follow-up with [place] in 3 to 6 months if possible. -     Comprehensive metabolic panel  Hyperlipidemia, unspecified hyperlipidemia type Will repeat a lipid panel today.  Suspect likely uncontrolled due to medication noncompliance.  I have refilled her Caduet 10-20, and I recommended that she take this medication as prescribed.  I did give her a 1 year supply due to lack of health insurance and ability to follow-up regularly, however I did recommend that she follow-up with [place] in 3 to 6 months if possible. -     Lipid panel  Severe episode of recurrent major depressive disorder, without psychotic features (HCC) Uncontrolled.  Typically is well controlled when she is compliant and has medication.   I did give her a 1 year supply due to lack of health insurance and ability to follow-up regularly, however I did recommend that she follow-up with [place] in 3 to 6 months if possible. I discussed with patient that if they develop any SI, to tell someone immediately and seek medical attention.  Other orders -     desvenlafaxine (PRISTIQ) 100 MG 24 hr tablet; TAKE 1 TABLET (100 MG) BY MOUTH DAILY -     amlodipine-atorvastatin (CADUET) 10-20 MG tablet; Take 1 tablet by mouth daily.  . Reviewed expectations re: course of current medical issues. . Discussed self-management of symptoms. . Outlined signs and symptoms indicating need for more acute intervention. . Patient verbalized understanding and all questions were answered. . See orders for this visit as documented in the electronic medical record. . Patient received an After-Visit Summary.  Jarold Motto, PA-C

## 2019-01-19 ENCOUNTER — Other Ambulatory Visit: Payer: Self-pay | Admitting: Physician Assistant

## 2019-01-19 MED ORDER — AMLODIPINE-ATORVASTATIN 10-40 MG PO TABS: 1.0000 | ORAL_TABLET | Freq: Every day | ORAL | 3 refills | Status: DC

## 2019-01-20 ENCOUNTER — Telehealth: Payer: Self-pay | Admitting: Physician Assistant

## 2019-01-20 NOTE — Telephone Encounter (Signed)
See below

## 2019-01-20 NOTE — Telephone Encounter (Signed)
Dawn with the [facility] called and states that they received two different prescriptions for amLODipine-atorvastatin (CADUET). They need to know what strength to fill.  Back line for providers is [phone].

## 2019-01-20 NOTE — Telephone Encounter (Signed)
[facility]ed [facility] and spoke to [place] told her Rx is for Caudet 10-40 mg. She did change Rx. Crsytal verbalized understanding.

## 2019-04-10 ENCOUNTER — Telehealth: Payer: Self-pay | Admitting: Physician Assistant

## 2019-04-10 NOTE — Telephone Encounter (Signed)
Spoke to pt , told her we had the wrong number for her and we looked up  you son's number. I will put this number in your chart. Pt verbalized understanding. Asked her if she ever read her My chart message. Pt said no, she does not have internet. Told her Labs have returned and Lelon Mast has actually increased her cholesterol medication (which is in a combo form of her blood pressure medication.)  Instead of her taking the Caduet 10-20, I would like for her to take the Caduet 10-40. This has been sent in for her. Pt verbalized understanding and will check with the pharmacy to see if they have. She said she was jut there for another Rx and they did not say anything to her. Told pt to let me know and I can send again if they do not have it. Pt verbalized understanding. Also Lelon Mast said she would like to get an abdominal ultrasound to check on her elevated liver function tests, is she agreeable to this? If so, please let me know and I will order. Pt said she does not have insurance at present or a job is trying to get an orange card for low insurance. Told her okay if you decide you want [place] to order this please let me know. Pt verbalized understanding.

## 2019-04-10 NOTE — Telephone Encounter (Signed)
Pt called asking if nurse could call and go over lab results from previous visit. Please advise.

## 2019-04-10 NOTE — Telephone Encounter (Signed)
Tried to contact pt the number is incorrect. No other number to contact pt.

## 2019-04-11 NOTE — Telephone Encounter (Signed)
Samantha notified of pt's status.

## 2019-04-24 ENCOUNTER — Telehealth: Payer: Self-pay | Admitting: Physician Assistant

## 2019-04-24 ENCOUNTER — Other Ambulatory Visit: Payer: Self-pay | Admitting: *Deleted

## 2019-04-24 DIAGNOSIS — I10 Essential (primary) hypertension: Secondary | ICD-10-CM

## 2019-04-24 MED ORDER — AMLODIPINE-ATORVASTATIN 10-40 MG PO TABS: 1.0000 | ORAL_TABLET | Freq: Every day | ORAL | 3 refills | Status: DC

## 2019-04-24 NOTE — Telephone Encounter (Signed)
MEDICATION: amLODipine-atorvastatin (CADUET) 10-40 MG tablet  PHARMACY:  [facility] - [place], [place] - [address] Phone:  [phone]  Fax:  [phone]       Comments:   **Let patient know to contact pharmacy at the end of the day to make sure medication is ready. **  ** Please notify patient to allow 48-72 hours to process**  **Encourage patient to contact the pharmacy for refills or they can request refills through [facility]

## 2019-10-10 ENCOUNTER — Telehealth: Payer: Self-pay

## 2019-10-10 NOTE — Telephone Encounter (Signed)
Spoke to pt sorry to hear you have COVID. Pt said dx yesterday at [facility] haivng fever 100-102, chills, body aches, headaches, cough expectorating white sputum. Denies nausea or vomiting. Pt wanted to know how long to quarantine for? Told her 10 days from symptoms and has to be fever free for 24 hours without medication. Pt verbalized understanding. Told pt she may be eligible for infusions to help her feel better. Pt said she does not have insurance. Told her there is a program for uninsured so she will not be charged. Pt verbalized understanding.   Discussed with Dr. Durene Cal he said pt is eligible and she is high risk.   Called Infusion center and left detailed message with pt information.  Called pt back told her I left message for the Infusion center with all your information and they should contact you about an appt. Pt verbalized understanding.

## 2019-10-10 NOTE — Telephone Encounter (Signed)
Pt received a positive covid test yesterday and is wanting to know next steps

## 2019-10-13 ENCOUNTER — Ambulatory Visit (HOSPITAL_COMMUNITY)
Admission: RE | Admit: 2019-10-13 | Discharge: 2019-10-13 | Disposition: A | Discharge status: Home / Self Care | Payer: HRSA Program | Source: Ambulatory Visit | Attending: Pulmonary Disease | Admitting: Pulmonary Disease

## 2019-10-13 ENCOUNTER — Other Ambulatory Visit: Payer: Self-pay | Admitting: Physician Assistant

## 2019-10-13 DIAGNOSIS — U071 COVID-19: Secondary | ICD-10-CM | POA: Insufficient documentation

## 2019-10-13 MED ORDER — DIPHENHYDRAMINE HCL 50 MG/ML IJ SOLN: 50.0000 mg | Freq: Once | INTRAMUSCULAR | Status: DC | PRN

## 2019-10-13 MED ORDER — METHYLPREDNISOLONE SODIUM SUCC 125 MG IJ SOLR: 125.0000 mg | Freq: Once | INTRAMUSCULAR | Status: DC | PRN

## 2019-10-13 MED ORDER — SODIUM CHLORIDE 0.9 % IV SOLN
INTRAVENOUS | Status: DC | PRN
Start: 2019-10-13 — End: 2019-10-14

## 2019-10-13 MED ORDER — FAMOTIDINE IN NACL 20-0.9 MG/50ML-% IV SOLN: 20.0000 mg | Freq: Once | INTRAVENOUS | Status: DC | PRN

## 2019-10-13 MED ORDER — ALBUTEROL SULFATE HFA 108 (90 BASE) MCG/ACT IN AERS: 2.0000 | INHALATION_SPRAY | Freq: Once | RESPIRATORY_TRACT | Status: DC | PRN

## 2019-10-13 MED ORDER — SODIUM CHLORIDE 0.9 % IV SOLN
1200.0000 mg | Freq: Once | INTRAVENOUS | Status: AC
  Administered 2019-10-13: 1200 mg via INTRAVENOUS

## 2019-10-13 MED ORDER — EPINEPHRINE 0.3 MG/0.3ML IJ SOAJ: 0.3000 mg | Freq: Once | INTRAMUSCULAR | Status: DC | PRN

## 2019-10-13 NOTE — Progress Notes (Signed)
  Diagnosis: COVID-19  Physician: Dr. Wright  Procedure: [facility]fusion [facility] Med: casirivimab\imdevimab infusion - Provided patient with casirivimab\imdevimab fact sheet for patients, parents and caregivers prior to infusion.  Complications: No immediate complications noted.  Discharge: Discharged home   Savannah Roy 10/13/2019   

## 2019-10-13 NOTE — Progress Notes (Signed)
I connected by phone with Savannah Roy on 10/13/2019 at 4:22 PM to discuss the potential use of a new treatment for mild to moderate COVID-19 viral infection in non-hospitalized patients.  This patient is a 59 y.o. female that meets the FDA criteria for Emergency Use Authorization of COVID monoclonal antibody casirivimab/imdevimab.  Has a (+) direct SARS-CoV-2 viral test result  Has mild or moderate COVID-19   Is NOT hospitalized due to COVID-19  Is within 10 days of symptom onset  Has at least one of the high risk factor(s) for progression to severe COVID-19 and/or hospitalization as defined in EUA.  Specific high risk criteria : BMI > 25   I have spoken and communicated the following to the patient or parent/caregiver regarding COVID monoclonal antibody treatment:  1. FDA has authorized the emergency use for the treatment of mild to moderate COVID-19 in adults and pediatric patients with positive results of direct SARS-CoV-2 viral testing who are 11 years of age and older weighing at least 40 kg, and who are at high risk for progressing to severe COVID-19 and/or hospitalization.  2. The significant known and potential risks and benefits of COVID monoclonal antibody, and the extent to which such potential risks and benefits are unknown.  3. Information on available alternative treatments and the risks and benefits of those alternatives, including clinical trials.  4. Patients treated with COVID monoclonal antibody should continue to self-isolate and use infection control measures (e.g., wear mask, isolate, social distance, avoid sharing personal items, clean and disinfect "high touch" surfaces, and frequent handwashing) according to CDC guidelines.   5. The patient or parent/caregiver has the option to accept or refuse COVID monoclonal antibody treatment.  After reviewing this information with the patient, The patient agreed to proceed with receiving casirivimab\imdevimab infusion  and will be provided a copy of the Fact sheet prior to receiving the infusion.  Sx onset 8/22. Set up for infusion on 8/30 @ 5:30pm. Directions given to [facility]. Pt is aware that insurance will be charged an infusion fee. She is fully vaccinated.   Cline Crock 10/13/2019 4:22 PM

## 2019-10-13 NOTE — Discharge Instructions (Signed)

## 2019-10-15 NOTE — Telephone Encounter (Signed)
Spoke to pt told her calling to follow up to make sure she went for her infusion. Pt said yes she did go, congestion is starting to break up and she does not have a fever. Good, told pt if fever comes back or if feels worse please do let us know. Pt verbalized understanding.

## 2019-10-17 ENCOUNTER — Other Ambulatory Visit (HOSPITAL_COMMUNITY): Payer: Self-pay | Admitting: Physician Assistant

## 2019-10-17 DIAGNOSIS — U071 COVID-19: Secondary | ICD-10-CM

## 2019-10-17 DIAGNOSIS — I10 Essential (primary) hypertension: Secondary | ICD-10-CM

## 2019-10-17 DIAGNOSIS — E669 Obesity, unspecified: Secondary | ICD-10-CM

## 2020-01-27 ENCOUNTER — Other Ambulatory Visit: Payer: Self-pay | Admitting: Physician Assistant

## 2020-04-13 ENCOUNTER — Other Ambulatory Visit: Payer: Self-pay

## 2020-04-13 ENCOUNTER — Ambulatory Visit (HOSPITAL_BASED_OUTPATIENT_CLINIC_OR_DEPARTMENT_OTHER)
Admission: RE | Admit: 2020-04-13 | Discharge: 2020-04-13 | Disposition: A | Discharge status: Home / Self Care | Payer: Self-pay | Source: Ambulatory Visit | Attending: Physician Assistant | Admitting: Physician Assistant

## 2020-04-13 ENCOUNTER — Ambulatory Visit (INDEPENDENT_AMBULATORY_CARE_PROVIDER_SITE_OTHER): Payer: Self-pay | Admitting: Physician Assistant

## 2020-04-13 ENCOUNTER — Encounter: Payer: Self-pay | Admitting: Physician Assistant

## 2020-04-13 VITALS — BP 130/72 | HR 91 | Temp 98.7°F | Ht 68.0 in | Wt 365.2 lb

## 2020-04-13 DIAGNOSIS — K148 Other diseases of tongue: Secondary | ICD-10-CM

## 2020-04-13 DIAGNOSIS — R55 Syncope and collapse: Secondary | ICD-10-CM

## 2020-04-13 DIAGNOSIS — R1012 Left upper quadrant pain: Secondary | ICD-10-CM | POA: Insufficient documentation

## 2020-04-13 DIAGNOSIS — R0602 Shortness of breath: Secondary | ICD-10-CM

## 2020-04-13 LAB — CBC WITH DIFFERENTIAL/PLATELET
Basophils Absolute: 0 10*3/uL (ref 0.0–0.1)
Basophils Relative: 0.4 % (ref 0.0–3.0)
Eosinophils Absolute: 0.3 10*3/uL (ref 0.0–0.7)
Eosinophils Relative: 9.2 % — ABNORMAL HIGH (ref 0.0–5.0)
HCT: 40.8 % (ref 36.0–46.0)
Hemoglobin: 13.6 g/dL (ref 12.0–15.0)
Lymphocytes Relative: 21 % (ref 12.0–46.0)
Lymphs Abs: 0.7 10*3/uL (ref 0.7–4.0)
MCHC: 33.4 g/dL (ref 30.0–36.0)
MCV: 91 fl (ref 78.0–100.0)
Monocytes Absolute: 0.2 10*3/uL (ref 0.1–1.0)
Monocytes Relative: 5.3 % (ref 3.0–12.0)
Neutro Abs: 2.2 10*3/uL (ref 1.4–7.7)
Neutrophils Relative %: 64.1 % (ref 43.0–77.0)
Platelets: 118 10*3/uL — ABNORMAL LOW (ref 150.0–400.0)
RBC: 4.48 Mil/uL (ref 3.87–5.11)
RDW: 16.1 % — ABNORMAL HIGH (ref 11.5–15.5)
WBC: 3.5 10*3/uL — ABNORMAL LOW (ref 4.0–10.5)

## 2020-04-13 LAB — COMPREHENSIVE METABOLIC PANEL
ALT: 40 U/L — ABNORMAL HIGH (ref 0–35)
AST: 69 U/L — ABNORMAL HIGH (ref 0–37)
Albumin: 3.6 g/dL (ref 3.5–5.2)
Alkaline Phosphatase: 102 U/L (ref 39–117)
BUN: 20 mg/dL (ref 6–23)
CO2: 31 mEq/L (ref 19–32)
Calcium: 8.8 mg/dL (ref 8.4–10.5)
Chloride: 105 mEq/L (ref 96–112)
Creatinine, Ser: 0.86 mg/dL (ref 0.40–1.20)
GFR: 73.03 mL/min (ref >60.00)
Glucose, Bld: 110 mg/dL — ABNORMAL HIGH (ref 70–99)
Potassium: 3.8 mEq/L (ref 3.5–5.1)
Sodium: 140 mEq/L (ref 135–145)
Total Bilirubin: 1.6 mg/dL — ABNORMAL HIGH (ref 0.2–1.2)
Total Protein: 7 g/dL (ref 6.0–8.3)

## 2020-04-13 LAB — H. PYLORI ANTIBODY, IGG: H Pylori IgG: NEGATIVE

## 2020-04-13 LAB — LIPASE: Lipase: 11 U/L (ref 11.0–59.0)

## 2020-04-13 NOTE — Patient Instructions (Signed)
It was great to see you!  Referral to cardiology for recent episode of lightheadedness/passing out. EKG today looks ok.  Update blood work today  Ultrasound to check your gallbladder and abdomen to see if we can figure out what's going on. Start daily over the counter nexium (generic is fine) to see if this can help your symptoms in the meantime.  If ultrasound normal and symptoms persist, we need to get a CT scan.  Referral to ENT for further evaluation of tongue lesion  Take care,  Jarold Motto PA-C

## 2020-04-13 NOTE — Progress Notes (Signed)
Savannah Roy is a 60 y.o. female here for a new problem.  I acted as a Neurosurgeon for [employer], PA-C Corky Mull, LPN   History of Present Illness:   Chief Complaint  Patient presents with  . Abdominal Pain  . spot on tongue    HPI   Abdominal pain Pt c/o pain LUQ started one month ago. Has constant tenderness. She has been having some nausea and vomiting off and on for the past two weeks. Vomiting 2-3 times a week over the past two weeks. When she eats, if its protein, she feels bad.  Denies constipation, diarrhea, rectal bleeding. Has had increased gas.    Syncopal event She fell about a week ago. She had eaten breakfast and then later in the afternoon felt lightheaded/woozy and fell down for unknown duration. She knocked a tooth out of her top teeth. This was not witnessed. Denies HA or blurred vision after this. Feels like she has had trouble word finding since August and September 2021 COVID-19.  Has had increased SOB and flutter feeling in her heart over the past few months. She has continued to have ongoing weight gain. She doesn't have health insurance and says she is not eligible for medicaid or medicare. We had her see cardiology in March 2018 for HTN, LVH, chest pain. Had echo done in 2018. She has amlodopine-atorvastatin 10-40 mg daily.  Wt Readings from Last 4 Encounters:  04/13/20 (!) 365 lb 4 oz (165.7 kg)  01/17/19 (!) 369 lb 12.8 oz (167.7 kg)  11/29/17 (!) 334 lb 8 oz (151.7 kg)  10/01/17 (!) 320 lb 6.4 oz (145.3 kg)    Tongue lesion Pt c/o spot on tongue noticed 2 weeks ago. The lesion is getting bigger. Denies pain, tenderness, bleeding. Former smoker.   Past Medical History:  Diagnosis Date  . LVH (left ventricular hypertrophy) due to hypertensive disease 05/10/2016  . Shortness of breath 04/07/2016     Social History   Tobacco Use  . Smoking status: Former Games developer  . Smokeless tobacco: Never Used  Substance Use Topics  . Alcohol use: Yes     Comment: occasionally  . Drug use: No    Past Surgical History:  Procedure Laterality Date  . ENDOMETRIAL ABLATION W/ NOVASURE  2007  . HERNIA REPAIR  1985  . KNEE ARTHROSCOPY  2005    Family History  Problem Relation Age of Onset  . Cancer Mother   . Heart attack Mother   . CVA Mother   . Thyroid disease Mother   . Kidney failure Father   . Thyroid disease Sister   . Heart failure Maternal Grandmother     Allergies  Allergen Reactions  . Erythromycin     REACTION: rash    Current Medications:   Current Outpatient Medications:  .  amLODipine-atorvastatin (CADUET) 10-40 MG tablet, Take 1 tablet by mouth daily., Disp: 90 tablet, Rfl: 3 .  desvenlafaxine (PRISTIQ) 100 MG 24 hr tablet, TAKE 1 TABLET (100 MG) BY MOUTH DAILY, Disp: 90 tablet, Rfl: 2 .  polyethylene glycol powder (GLYCOLAX/MIRALAX) powder, Take 17 g by mouth daily., Disp: 3350 g, Rfl: 1   Review of Systems:   ROS Negative unless otherwise specified per HPI.  Vitals:   Vitals:   04/13/20 1358  BP: 130/72  Pulse: 91  Temp: 98.7 F (37.1 C)  TempSrc: Temporal  SpO2: 96%  Weight: (!) 365 lb 4 oz (165.7 kg)  Height: 5\' 8"  (1.727 m)  Body mass index is 55.54 kg/m.  Physical Exam:   Physical Exam Vitals and nursing note reviewed.  Constitutional:      General: She is not in acute distress.    Appearance: She is well-developed. She is not ill-appearing, toxic-appearing or sickly-appearing.  Cardiovascular:     Rate and Rhythm: Normal rate and regular rhythm.     Pulses: Normal pulses.     Heart sounds: Normal heart sounds, S1 normal and S2 normal.     Comments: No LE edema Pulmonary:     Effort: Pulmonary effort is normal.     Breath sounds: Normal breath sounds.  Abdominal:     General: Abdomen is flat. Bowel sounds are normal.     Palpations: Abdomen is soft.     Tenderness: There is abdominal tenderness in the left upper quadrant. There is no right CVA tenderness, left CVA tenderness,  guarding or rebound.    Skin:    General: Skin is warm, dry and intact.     Comments: Pedunculated lesion to center of tongue, approximately 1/2 cm  Neurological:     Mental Status: She is alert.     GCS: GCS eye subscore is 4. GCS verbal subscore is 5. GCS motor subscore is 6.  Psychiatric:        Mood and Affect: Mood and affect normal.        Speech: Speech normal.        Behavior: Behavior normal. Behavior is cooperative.       Assessment and Plan:   Savannah Roy was seen today for abdominal pain and spot on tongue.  Diagnoses and all orders for this visit:  SOB (shortness of breath); Syncope, unspecified syncope type  EKG tracing is personally reviewed.  EKG notes NSR.  No acute changes.  Due to spontaneous syncopal event, will refer to cardiology for further evaluation and management. Discussed that if symptoms worsen, she should go to the ER. Normotensive in office. -     EKG 12-Lead -     Ambulatory referral to Cardiology  LUQ pain Unclear etiology. DDx: IBS, pancreatitis, constipation, gallbladder, PUD, gastritis, among others. Trial daily nexium OTC to see if this helps symptoms. Update blood work. Order abdominal u/s for further evaluation. If abdominal u/s negative and sx worsen/persist, will obtain CT scan. No evidence of acute abdomen on exam. -     CBC with Differential/Platelet -     Comprehensive metabolic panel -     Lipase -     H. pylori antibody, IgG -     US Abdomen Complete; Future  Tongue lesion Referral to ent for appropriate management. -     Ambulatory referral to ENT  CMA or LPN served as scribe during this visit. History, Physical, and Plan performed by medical provider. The above documentation has been reviewed and is accurate and complete.   Jarold Motto, PA-C

## 2020-04-14 ENCOUNTER — Other Ambulatory Visit: Payer: Self-pay | Admitting: Physician Assistant

## 2020-04-14 ENCOUNTER — Other Ambulatory Visit: Payer: Self-pay

## 2020-04-14 ENCOUNTER — Other Ambulatory Visit: Payer: Self-pay | Admitting: *Deleted

## 2020-04-14 ENCOUNTER — Telehealth: Payer: Self-pay

## 2020-04-14 DIAGNOSIS — R161 Splenomegaly, not elsewhere classified: Secondary | ICD-10-CM

## 2020-04-14 DIAGNOSIS — K8020 Calculus of gallbladder without cholecystitis without obstruction: Secondary | ICD-10-CM

## 2020-04-14 NOTE — Addendum Note (Signed)
Addended by: Haynes Bast on: 04/14/2020 02:28 PM   Modules accepted: Orders

## 2020-04-14 NOTE — Telephone Encounter (Signed)
Pt can't login to [employer]. She is requesting imaging results from yesterday.

## 2020-04-14 NOTE — Telephone Encounter (Signed)
Please see result notes.  

## 2020-04-15 LAB — PATHOLOGIST SMEAR REVIEW

## 2020-04-26 ENCOUNTER — Other Ambulatory Visit: Payer: Self-pay | Admitting: Physician Assistant

## 2020-04-26 DIAGNOSIS — I10 Essential (primary) hypertension: Secondary | ICD-10-CM

## 2020-04-27 ENCOUNTER — Encounter: Payer: Self-pay | Admitting: Cardiology

## 2020-04-27 ENCOUNTER — Other Ambulatory Visit: Payer: Self-pay

## 2020-04-27 ENCOUNTER — Encounter: Payer: Self-pay | Admitting: *Deleted

## 2020-04-27 ENCOUNTER — Ambulatory Visit (INDEPENDENT_AMBULATORY_CARE_PROVIDER_SITE_OTHER): Payer: Self-pay | Admitting: Cardiology

## 2020-04-27 VITALS — BP 144/82 | HR 90 | Ht 71.0 in | Wt 357.4 lb

## 2020-04-27 DIAGNOSIS — R079 Chest pain, unspecified: Secondary | ICD-10-CM

## 2020-04-27 DIAGNOSIS — G4733 Obstructive sleep apnea (adult) (pediatric): Secondary | ICD-10-CM

## 2020-04-27 DIAGNOSIS — R55 Syncope and collapse: Secondary | ICD-10-CM

## 2020-04-27 DIAGNOSIS — R002 Palpitations: Secondary | ICD-10-CM

## 2020-04-27 NOTE — Progress Notes (Signed)
Cardiology Office Note:    Date:  04/29/2020   ID:  Savannah Roy, DOB May 06, 1960, MRN [id number]  PCP:  Savannah Motto, PA  Cardiologist:  No primary care provider on file.  Electrophysiologist:  None   Referring MD: Savannah Motto, PA   Chief Complaint  Patient presents with  . Loss of Consciousness    History of Present Illness:    Savannah Roy is a 60 y.o. female with a hx of hypertension, morbid obesity who is referred by Savannah Motto, PA for evaluation of syncope.  She previously followed with Dr. Duke Roy, last seen in 2018.  She had been seen for chest pain/shortness of breath.  Lexiscan Myoview on 04/19/2016 showed no ischemia, LVEF 49%.  Echocardiogram showed EF 65 to 70% with moderate LVH and grade 1 diastolic dysfunction.  Reports she has been having chest pain, has been occurring a couple times per week.  Describes squeezing pain in left upper chest.  Typically last for 3 to 4 seconds and resolves.  Also reports being short of breath with minimal exertion.  Reports that 3 weeks ago she was walking from the living room to the kitchen and started having lightheadedness and suddenly fell to the ground.  Suffered a broken tooth.  She does not know how long she was unconscious for.  There was no recent position change, states that she was standing and dusting prior to walking from the living room to the kitchen.  Does report she has been having palpitations where she feels like her heart is fluttering, occurs at least twice per week and typically lasts for less than 1 minute.  Prior to syncopal episode, she denies any palpitations or chest pain or shortness of breath.  She was told years ago that she had OSA but is not on CPAP.    Wt Readings from Last 3 Encounters:  04/27/20 (!) 357 lb 6.4 oz (162.1 kg)  04/13/20 (!) 365 lb 4 oz (165.7 kg)  01/17/19 (!) 369 lb 12.8 oz (167.7 kg)    Past Medical History:  Diagnosis Date  . LVH (left ventricular hypertrophy) due  to hypertensive disease 05/10/2016  . Shortness of breath 04/07/2016    Past Surgical History:  Procedure Laterality Date  . ENDOMETRIAL ABLATION W/ NOVASURE  2007  . HERNIA REPAIR  1985  . KNEE ARTHROSCOPY  2005    Current Medications: Current Meds  Medication Sig  . CADUET 10-40 MG tablet TAKE 1 TABLET BY MOUTH DAILY.  Marland Kitchen desvenlafaxine (PRISTIQ) 100 MG 24 hr tablet TAKE 1 TABLET (100 MG) BY MOUTH DAILY  . polyethylene glycol powder (GLYCOLAX/MIRALAX) powder Take 17 g by mouth daily.     Allergies:   Erythromycin   Social History   Socioeconomic History  . Marital status: Divorced    Spouse name: Not on file  . Number of children: Not on file  . Years of education: Not on file  . Highest education level: Not on file  Occupational History  . Not on file  Tobacco Use  . Smoking status: Former Games developer  . Smokeless tobacco: Never Used  Substance and Sexual Activity  . Alcohol use: Yes    Comment: occasionally  . Drug use: No  . Sexual activity: Never  Other Topics Concern  . 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 Difficulty of Paying Living Expenses: Somewhat hard  Food Insecurity: No Food Insecurity  . Worried About Programme researcher, broadcasting/film/video in the Last Year: Never true  . Ran Out of Food in the Last Year: Never true  Transportation Needs: No Transportation Needs  . Lack of Transportation (Medical): No  . Lack of Transportation (Non-Medical): No  Physical Activity: Not on file  Stress: Not on file  Social Connections: Not on file     Family History: The patient's family history includes CVA in her mother; Cancer in her mother; Heart attack in her mother; Heart failure in her maternal grandmother; Kidney failure in her father; Thyroid disease in her mother and sister.  ROS:    Please see the history of present illness.     All other systems reviewed and are negative.  EKGs/Labs/Other Studies Reviewed:    The following studies were reviewed today:   EKG:  EKG is not ordered today.  The ekg on 04/13/2020 showed normal sinus rhythm, rate 86, QTc 430, poor R wave progression, less than 1 mm ST depressions in inferior leads  Recent Labs: 04/13/2020: ALT 40; BUN 20; Creatinine, Ser 0.86; Hemoglobin 13.6; Platelets 118.0; Potassium 3.8; Sodium 140  Recent Lipid Panel    Component Value Date/Time   CHOL 207 (H) 01/17/2019 1124   TRIG 137.0 01/17/2019 1124   HDL 31.80 (L) 01/17/2019 1124   CHOLHDL 6 01/17/2019 1124   VLDL 27.4 01/17/2019 1124   LDLCALC 147 (H) 01/17/2019 1124    Physical Exam:    VS:  BP (!) 144/82   Pulse 90   Ht 5\' 11"  (1.803 m)   Wt (!) 357 lb 6.4 oz (162.1 kg)   LMP  (LMP Unknown) Comment: LMP 2009  SpO2 93%   BMI 49.85 kg/m     Wt Readings from Last 3 Encounters:  04/27/20 (!) 357 lb 6.4 oz (162.1 kg)  04/13/20 (!) 365 lb 4 oz (165.7 kg)  01/17/19 (!) 369 lb 12.8 oz (167.7 kg)     GEN: Well nourished, well developed in no acute distress HEENT: Normal NECK: No JVD; No carotid bruits LYMPHATICS: No lymphadenopathy CARDIAC: RRR, no murmurs, rubs, gallops RESPIRATORY:  Clear to auscultation without rales, wheezing or rhonchi  ABDOMEN: Soft, non-tender, non-distended MUSCULOSKELETAL:  No edema; No deformity  SKIN: Warm and dry NEUROLOGIC:  Alert and oriented x 3 PSYCHIATRIC:  Normal affect   ASSESSMENT:    1. Syncope, unspecified syncope type   2. Chest pain of uncertain etiology   3. Palpitations   4. OSA (obstructive sleep apnea)    PLAN:    Syncope: Unclear cause.  Concerning for arrhythmia given sudden loss of consciousness.  Will check echocardiogram to evaluate for structural heart disease.  Will check Zio patch x2 weeks to evaluate for arrhythmia  Chest pain: Description suggest noncardiac pain, as describes  nonexertional pain in upper chest that only last for few seconds and resolves.  No further work-up recommended mended at this time.  Palpitations: Description concerning for arrhythmia, will evaluate with Zio patch x2 weeks as above  OSA: Reports prior diagnosis but is not on CPAP.  Recommend sleep study but she is self-pay would like to hold off at this time. Will ask social worker to see patient about available resources   RTC in 3 months  Medication Adjustments/Labs and Tests Ordered: Current medicines are reviewed at length with the patient today.  Concerns  regarding medicines are outlined above.  Orders Placed This Encounter  Procedures  . ECHOCARDIOGRAM COMPLETE   No orders of the defined types were placed in this encounter.   Patient Instructions  Medication Instructions:  Your physician recommends that you continue on your current medications as directed. Please refer to the Current Medication list given to you today.  *If you need a refill on your cardiac medications before your next appointment, please call your pharmacy*  Testing/Procedures: Your physician has requested that you have an echocardiogram. Echocardiography is a painless test that uses sound waves to create images of your heart. It provides your doctor with information about the size and shape of your heart and how well your heart's chambers and valves are working. This procedure takes approximately one hour. There are no restrictions for this procedure.  [facility]:  [address]   ZIO XT- Long Term Monitor Instructions   Your physician has requested you wear your ZIO patch monitor____14___days.   This is a single patch monitor.  Ir[employer] supplies one patch monitor per enrollment.  Additional stickers are not available.   Please do not apply patch if you will be having a Nuclear Stress Test, Echocardiogram, Cardiac CT, MRI, or Chest Xray during the time  frame you would be wearing the monitor. The patch cannot be worn during these tests.  You cannot remove and re-apply the ZIO XT patch monitor.   Your ZIO patch monitor will be sent USPS Priority mail from [facility] directly to your home address. The monitor may also be mailed to a PO BOX if home delivery is not available.   It may take 3-5 days to receive your monitor after you have been enrolled.   Once you have received you monitor, please review enclosed instructions.  Your monitor has already been registered assigning a specific monitor serial # to you.   Applying the monitor   Shave hair from upper left chest.   Hold abrader disc by orange tab.  Rub abrader in 40 strokes over left upper chest as indicated in your monitor instructions.   Clean area with 4 enclosed alcohol pads .  Use all pads to assure are is cleaned thoroughly.  Let dry.   Apply patch as indicated in monitor instructions.  Patch will be place under collarbone on left side of chest with arrow pointing upward.   Rub patch adhesive wings for 2 minutes.Remove white label marked "1".  Remove white label marked "2".  Rub patch adhesive wings for 2 additional minutes.   While looking in a mirror, press and release button in center of patch.  A small green light will flash 3-4 times .  This will be your only indicator the monitor has been turned on.     Do not shower for the first 24 hours.  You may shower after the first 24 hours.   Press button if you feel a symptom. You will hear a small click.  Record Date, Time and Symptom in the Patient Log Book.   When you are ready to remove patch, follow instructions on last 2 pages of Patient Log Book.  Stick patch monitor onto last page of Patient Log Book.   Place Patient Log Book in [place] box.  Use locking tab on box and tape box closed securely.  The Orange and [employer] box has [employer]aid postage on it.  Please place in mailbox as soon as possible.  Your physician should have  your test results approximately 7 days after the monitor has been mailed back to [facility]   Call [facility] Customer Care at [phone] if you have questions regarding your ZIO XT patch monitor.  Call them immediately if you see an orange light blinking on your monitor.   If your monitor falls off in less than 4 days contact our Monitor department at [phone].  If your monitor becomes loose or falls off after 4 days call [employer] at [phone] for suggestions on securing your monitor.   Follow-Up: At [facility], you and your health needs are our priority.  As part of our continuing mission to provide you with exceptional heart care, we have created designated Provider Care Teams.  These Care Teams include your primary Cardiologist (physician) and Advanced Practice Providers (APPs -  Physician Assistants and Nurse Practitioners) who all work together to provide you with the care you need, when you need it.  We recommend signing up for the patient portal called "MyChart".  Sign up information is provided on this After Visit Summary.  MyChart is used to connect with patients for Virtual Visits (Telemedicine).  Patients are able to view lab/test results, encounter notes, upcoming appointments, etc.  Non-urgent messages can be sent to your provider as well.   To learn more about what you can do with MyChart, go to [email or web address].    Your next appointment:   3 month(s)  The format for your next appointment:   In Person  Provider:   Epifanio Lesches, MD   Other Instructions You have been referred to our social worker to assist with coverage      Signed, Little Ishikawa, MD  04/29/2020 5:08 PM    [facility]

## 2020-04-27 NOTE — Patient Instructions (Signed)
Medication Instructions:  Your physician recommends that you continue on your current medications as directed. Please refer to the Current Medication list given to you today.  *If you need a refill on your cardiac medications before your next appointment, please call your pharmacy*  Testing/Procedures: Your physician has requested that you have an echocardiogram. Echocardiography is a painless test that uses sound waves to create images of your heart. It provides your doctor with information about the size and shape of your heart and how well your heart's chambers and valves are working. This procedure takes approximately one hour. There are no restrictions for this procedure.  This will be done at our [facility] location:  [address]   ZIO XT- Long Term Monitor Instructions   Your physician has requested you wear your ZIO patch monitor____14___days.   This is a single patch monitor.  Irhythm supplies one patch monitor per enrollment.  Additional stickers are not available.   Please do not apply patch if you will be having a Nuclear Stress Test, Echocardiogram, Cardiac CT, MRI, or Chest Xray during the time frame you would be wearing the monitor. The patch cannot be worn during these tests.  You cannot remove and re-apply the ZIO XT patch monitor.   Your ZIO patch monitor will be sent USPS Priority mail from [facility] directly to your home address. The monitor may also be mailed to a PO BOX if home delivery is not available.   It may take 3-5 days to receive your monitor after you have been enrolled.   Once you have received you monitor, please review enclosed instructions.  Your monitor has already been registered assigning a specific monitor serial # to you.   Applying the monitor   Shave hair from upper left chest.   Hold abrader disc by orange tab.  Rub abrader in 40 strokes over left upper chest as indicated in your monitor instructions.   Clean  area with 4 enclosed alcohol pads .  Use all pads to assure are is cleaned thoroughly.  Let dry.   Apply patch as indicated in monitor instructions.  Patch will be place under collarbone on left side of chest with arrow pointing upward.   Rub patch adhesive wings for 2 minutes.Remove white label marked "1".  Remove white label marked "2".  Rub patch adhesive wings for 2 additional minutes.   While looking in a mirror, press and release button in center of patch.  A small green light will flash 3-4 times .  This will be your only indicator the monitor has been turned on.     Do not shower for the first 24 hours.  You may shower after the first 24 hours.   Press button if you feel a symptom. You will hear a small click.  Record Date, Time and Symptom in the Patient Log Book.   When you are ready to remove patch, follow instructions on last 2 pages of Patient Log Book.  Stick patch monitor onto last page of Patient Log Book.   Place Patient Log Book in [place] box.  Use locking tab on box and tape box closed securely.  The Orange and [employer] has [employer] on it.  Please place in mailbox as soon as possible.  Your physician should have your test results approximately 7 days after the monitor has been mailed back to [facility].   Call [facility] Customer Care at [phone] if you have questions regarding  your ZIO XT patch monitor.  Call them immediately if you see an orange light blinking on your monitor.   If your monitor falls off in less than 4 days contact our Monitor department at [phone].  If your monitor becomes loose or falls off after 4 days call [employer] at [phone] for suggestions on securing your monitor.   Follow-Up: At [facility], you and your health needs are our priority.  As part of our continuing mission to provide you with exceptional heart care, we have created designated Provider Care Teams.  These Care Teams include your primary Cardiologist  (physician) and Advanced Practice Providers (APPs -  Physician Assistants and Nurse Practitioners) who all work together to provide you with the care you need, when you need it.  We recommend signing up for the patient portal called "MyChart".  Sign up information is provided on this After Visit Summary.  MyChart is used to connect with patients for Virtual Visits (Telemedicine).  Patients are able to view lab/test results, encounter notes, upcoming appointments, etc.  Non-urgent messages can be sent to your provider as well.   To learn more about what you can do with MyChart, go to [email or web address].    Your next appointment:   3 month(s)  The format for your next appointment:   In Person  Provider:   Epifanio Lesches, MD   Other Instructions You have been referred to our social worker to assist with coverage

## 2020-04-28 ENCOUNTER — Telehealth: Payer: Self-pay | Admitting: Licensed Clinical Social Worker

## 2020-04-28 NOTE — Telephone Encounter (Signed)
Referral for pt from [facility] [place], as pt is self pay and in need of ongoing cardiology work up. HIPAA compliant message left for pt at [phone].   Octavio Graves, MSW, LCSW [facility] Heart/Vascular Care Navigation  [phone]

## 2020-04-28 NOTE — Progress Notes (Signed)
Heart and Vascular Care Navigation  04/28/2020  DALLAS SCORSONE 1960-08-21 [id number]  Reason for Referral:  Self pay; ongoing medical work up needed                                                                                                    Assessment:                                     LCSW was able to connect with pt this afternoon at [phone]. Introduced self, role, reason for call. Pt confirms she is currently uninsured. She confirms home address, PCP, and that her emergency contact is her son (updated his number to reflect that they currently share a number). Pt last worked in roughly 2018. She had coverage under that employer but has not had insurance since. She went to DSS and was told she does not qualify for Medicaid. She has never filed for disability. She plans on filing for early retirement this year when she turns 77. She currently lives w/ her son who has disability pending. She was approved for [facility] and feels comfortable w/ her ability to access food. She has all of her meds covered for free at the [facility]. When she does need assistance w/ copays or getting her utilities covered she borrows funds from her father. She is worried about the upcoming bills for testing and ongoing care ordered by Dr. Bjorn Pippin and also shares she needs ongoing [facility] care too.   LCSW shared that pt may be eligible for care through the [facility] and that she may be eligible for the CAFA application Network engineer Aid). Pt is willing to work on these applications and requested that they be mailed to her home address.   Although pt does drive herself to appointments she has to borrow money to pay for gas as well from her father and she feels at times bad for the assistance. I shared that we have a [employer] department that may be able to assist her with rides if she ever gets in a pinch.   I shared that I would be in touch moving forward and would place  applications in the mail today.   HRT/VAS Care Coordination    Patients Home Cardiology Office [facility]   Outpatient Care Team Social Worker   Social Worker Name: Esmeralda Links [place], [phone]   Living arrangements for the past 2 months Single Family Home   Lives with: Adult Children; Self   Patient Current Insurance Coverage Self-Pay   Patient Has Concern With Paying Medical Bills Yes   Patient Concerns With Medical Bills previous and ongoing medical expenses   Medical Bill Referrals: CAFA   Does Patient Have Prescription Coverage? No   Patient Prescription Assistance Programs Other   Other Assistance Programs Medications patient receives all medications through [facility] Department   Home Assistive Devices/Equipment None      Social History:  SDOH Screenings   Alcohol Screen: Not on file  Depression (QIH4-7): Not on file  Financial Resource Strain: Medium Risk  . Difficulty of Paying Living Expenses: Somewhat hard  Food Insecurity: No Food Insecurity  . Worried About Programme researcher, broadcasting/film/video in the Last Year: Never true  . Ran Out of Food in the Last Year: Never true  Housing: Low Risk   . Last Housing Risk Score: 0  Physical Activity: Not on file  Social Connections: Not on file  Stress: Not on file  Tobacco Use: Medium Risk  . Smoking Tobacco Use: Former Smoker  . Smokeless Tobacco Use: Never Used  Transportation Needs: No Transportation Needs  . Lack of Transportation (Medical): No  . Lack of Transportation (Non-Medical): No    SDOH Interventions: Financial Resources:  Financial Strain Interventions: Other (Comment) (CAFA; [employer])  Food Insecurity:  Food Insecurity Interventions: Intervention Not Indicated  Housing Insecurity:  Housing Interventions: Other (Comment) (will mail information about [employer] for Housing and [employer] Studies  Building control surveyor program)  Transportation:   Transportation Interventions: Retail banker    Other Care Navigation Interventions:     Provided Pharmacy assistance resources Other- pt utilizes Health Department for medications   Follow-up plan:   LCSW has mailed CAFA, [employer], and Higher education careers adviser information to patient. I have included my card and [employer] card. I also included information about [employer] for Housing and Community Studies utility application assistance if she is interested in reaching out.  I updated Hayley, RN, with the above information. Continue to follow to assist pt as needed moving forward.

## 2020-05-05 ENCOUNTER — Telehealth: Payer: Self-pay | Admitting: Licensed Clinical Social Worker

## 2020-05-05 NOTE — Progress Notes (Signed)
Heart and Vascular Care Navigation  05/05/2020  Savannah Roy 11/28/1960 [id number]  Reason for Referral:  Engaged with patient by telephone for follow up visit for Heart and Vascular Care Coordination.                                                                                                   Assessment:     LCSW received call back from pt. She shares that she is working on Media planner and gathering all needed documentation for each. She had been hopeful to get the information all together by her appointment with [facility] this Friday but had not yet. Pt will call me Monday and schedule a time to come by and we can review the paperwork and ensure that pt has all needed documents. LCSW will assist w/ notarization of paperwork as pt father who will complete the paperwork is limited in his ability to come by in person as he is primary caregiver for someone who is bedbound w/ dementia. Pt has my number for additional questions/concerns as needed.                               HRT/VAS Care Coordination    Patients Home Cardiology Office [facility]   Outpatient Care Team Social Worker   Social Worker Name: Esmeralda Links [place], [phone]   Living arrangements for the past 2 months Single Family Home   Lives with: Adult Children; Self   Patient Current Insurance Coverage Self-Pay   Patient Has Concern With Paying Medical Bills Yes   Patient Concerns With Medical Bills previous and ongoing medical expenses   Medical Bill Referrals: CAFA/[facility] Card   Does Patient Have Prescription Coverage? No   Patient Prescription Assistance Programs Other   Other Assistance Programs Medications patient receives all medications through [facility] Department   Home Assistive Devices/Equipment None      Social History:                                                                             SDOH Screenings   Alcohol Screen:  Not on file  Depression [phone]): Not on file  Financial Resource Strain: Medium Risk  . Difficulty of Paying Living Expenses: Somewhat hard  Food Insecurity: No Food Insecurity  . Worried About Programme researcher, broadcasting/film/video in the Last Year: Never true  . Ran Out of Food in the Last Year: Never true  Housing: Low Risk   . Last Housing Risk Score: 0  Physical Activity: Not on file  Social Connections: Not on file  Stress: Not on file  Tobacco Use: Medium Risk  . Smoking Tobacco Use: Former Smoker  . Smokeless Tobacco Use: Never Used  Transportation  Needs: No Transportation Needs  . Lack of Transportation (Medical): No  . Lack of Transportation (Non-Medical): No    SDOH Interventions: Financial Resources:  Financial Strain Interventions: Other (Comment) (f/u on [employer] and CAFA)     Follow-up plan:   LCSW will f/u if I do not hear from pt Monday, able to assist pt with these applications as needed moving forward.

## 2020-05-05 NOTE — Telephone Encounter (Signed)
LCSW called pt and left HIPAA compliant message for pt to f/u on discussion and resources mailed last week.   Octavio Graves, MSW, LCSW [facility] Health Heart/Vascular Care Navigation  [phone]

## 2020-05-07 ENCOUNTER — Ambulatory Visit: Payer: Self-pay | Admitting: General Surgery

## 2020-05-07 NOTE — H&P (Signed)
History of Present Illness Savannah Filler MD; 05/10/2020 9:55 AM) The patient is a 60 year old female who presents for evaluation of gall stones. 60 year old female, with a history of hypertension, comes in secondary to abdominal pain. Patient states that she's had multiple episodes of epigastric/right upper quadrant abdominal pain. She states that she does have some associated nausea and vomiting. She states that she has no radiation of pain. Patient states that she feels at higher fatty foods do cause her to have some pain and discomfort. She has been trying to stay away from high fatty food to help minimize her discomfort.  Patient had a previous umbilical hernia in the past.   Patient does have an ultrasound which did reveal a gallstone 1.7 cm. I did review this personally.     Past Surgical History Blanchie Dessert, [place]; 05-10-20 9:46 AM) Foot Surgery  Right. Knee Surgery  Right.  Diagnostic Studies History Blanchie Dessert, [place]; 2020/05/10 9:46 AM) Colonoscopy  never Mammogram  >3 years ago Pap Smear  >5 years ago  Allergies Rosezella [place], RN; May 10, 2020 9:53 AM) Erythromycin *MACROLIDES*  Hives, Itching. Allergies Reconciled   Medication History Blanchie Dessert, CMA; May 10, 2020 9:50 AM) Caduet (10-10MG  Tablet, Oral) Active. Desvenlafaxine Fumarate ER (100MG  Tablet ER 24HR, Oral) Active. Medications Reconciled  Social History , Blanchie Dessert; 05-10-2020 9:46 AM) Alcohol use  Occasional alcohol use. Caffeine use  Tea. Illicit drug use  Prefer to discuss with provider. Tobacco use  Former smoker.  Family History 05/09/2020, Blanchie Dessert; 2020-05-10 9:46 AM) Cerebrovascular Accident  Mother. Colon Cancer  Father. Colon Polyps  Father. Diabetes Mellitus  Son. Heart Disease  Mother. Heart disease in female family member before age 73  Hypertension  Mother. Kidney Disease  Father. Prostate Cancer  Father. Thyroid problems  Mother, Sister.  Pregnancy / Birth History  74, Blanchie Dessert; May 10, 2020 9:46 AM) Age at menarche  10 years. Gravida  2 Irregular periods  Maternal age  55-25 Para  1  Other Problems 23-34, Blanchie Dessert; 05/10/20 9:46 AM) Anxiety Disorder  Bladder Problems  Cholelithiasis  Depression  High blood pressure  Hypercholesterolemia  Sleep Apnea  Umbilical Hernia Repair     Review of Systems 05/09/2020 MD; 2020/05/10 9:53 AM) General Present- Chills, Fatigue, Fever, Night Sweats and Weight Gain. Not Present- Appetite Loss and Weight Loss. Skin Present- Dryness and Non-Healing Wounds. Not Present- Change in Wart/Mole, Hives, Jaundice, New Lesions, Rash and Ulcer. Respiratory Present- Difficulty Breathing, Snoring and Wheezing. Not Present- Bloody sputum and Chronic Cough. Cardiovascular Present- Chest Pain, Rapid Heart Rate, Shortness of Breath and Swelling of Extremities. Not Present- Difficulty Breathing Lying Down, Leg Cramps and Palpitations. Gastrointestinal Present- Abdominal Pain, Bloating, Constipation, Excessive gas, Nausea and Vomiting. Not Present- Bloody Stool, Change in Bowel Habits, Chronic diarrhea, Difficulty Swallowing, Gets full quickly at meals, Hemorrhoids, Indigestion and Rectal Pain. Female Genitourinary Present- Frequency and Nocturia. Not Present- Painful Urination, Pelvic Pain and Urgency. Musculoskeletal Present- Joint Pain. Not Present- Back Pain, Joint Stiffness, Muscle Pain, Muscle Weakness and Swelling of Extremities. Neurological Present- Weakness. Not Present- Decreased Memory, Fainting, Headaches, Numbness, Seizures, Tingling, Tremor and Trouble walking. Psychiatric Present- Anxiety, Change in Sleep Pattern and Depression. Not Present- Bipolar, Fearful and Frequent crying. Endocrine Present- Hot flashes. Not Present- Cold Intolerance, Excessive Hunger, Hair Changes, Heat Intolerance and New Diabetes. Hematology Present- Easy Bruising. Not Present- Blood Thinners, Excessive bleeding, Gland  problems, HIV and Persistent Infections. All other systems negative  Vitals 05/09/2020 CMA; May 10, 2020 9:47 AM) 05-10-2020  9:47 AM Weight: 360.5 lb Height: 68in Body Surface Area: 2.63 m Body Mass Index: 54.81 kg/m  Temp.: 97.48F  Pulse: 117 (Regular)  P.OX: 96% (Room air) BP: 140/86(Sitting, Left Arm, Standard)       Physical Exam Savannah Filler MD; 05/07/2020 9:55 AM) The physical exam findings are as follows: Note: Constitutional: No acute distress, conversant, appears stated age  Eyes: Anicteric sclerae, moist conjunctiva, no lid lag  Neck: No thyromegaly, trachea midline, no cervical lymphadenopathy  Lungs: Clear to auscultation biilaterally, normal respiratory effot  Cardiovascular: regular rate & rhythm, no murmurs, no peripheal edema, pedal pulses 2+  GI: Soft, no masses or hepatosplenomegaly, non-tender to palpation  MSK: Normal gait, no clubbing cyanosis, edema  Skin: No rashes, palpation reveals normal skin turgor  Psychiatric: Appropriate judgment and insight, oriented to person, place, and time    Assessment & Plan Savannah Filler MD; 05/07/2020 9:55 AM) SYMPTOMATIC CHOLELITHIASIS (K80.20) Impression: Patient is a 60 year old female with symptomatic cholelithiasis 1. We will proceed to the operating room for a laparoscopic cholecystectomy  2. Risks and benefits were discussed with the patient to generally include, but not limited to: infection, bleeding, possible need for post op ERCP, damage to the bile ducts, bile leak, and possible need for further surgery. Alternatives were offered and described. All questions were answered and the patient voiced understanding of the procedure and wishes to proceed at this point with a laparoscopic cholecystectomy

## 2020-05-10 ENCOUNTER — Telehealth: Payer: Self-pay | Admitting: Licensed Clinical Social Worker

## 2020-05-10 NOTE — Progress Notes (Signed)
Heart and Vascular Care Navigation  05/10/2020  Savannah Roy Feb 01, 1961 [id number]  Reason for Referral:   Engaged with patient by telephone for follow up visit for Heart and Vascular Care Coordination. F/u on assistance applications                                                                                                   Assessment:                                     LCSW f/u with pt regarding assistance applications, pt son answered (DPR on file) and brought phone to pt. Pt will be in [place] w/ son tomorrow as he goes to disability hearing. She will bring paperwork by the office for me to assist with going over and submitting if completed. Pt will call when she is on the way to the office, roughly planning for 1pm.   HRT/VAS Care Coordination    Patients Home Cardiology Office [facility]   Outpatient Care Team Social Worker   Social Worker Name: Esmeralda Links [place], [phone]   Living arrangements for the past 2 months Single Family Home   Lives with: Adult Children; Self   Patient Current Insurance Coverage Self-Pay   Patient Has Concern With Paying Medical Bills Yes   Patient Concerns With Medical Bills previous and ongoing medical expenses   Medical Bill Referrals: CAFA/[facility] Card   Does Patient Have Prescription Coverage? No   Patient Prescription Assistance Programs Other   Other Assistance Programs Medications patient receives all medications through [facility] Department   Home Assistive Devices/Equipment None      Social History:                                                                             SDOH Screenings   Alcohol Screen: Not on file  Depression [phone]): Not on file  Financial Resource Strain: Medium Risk  . Difficulty of Paying Living Expenses: Somewhat hard  Food Insecurity: No Food Insecurity  . Worried About Programme researcher, broadcasting/film/video in the Last Year: Never true  . Ran Out of Food in the  Last Year: Never true  Housing: Low Risk   . Last Housing Risk Score: 0  Physical Activity: Not on file  Social Connections: Not on file  Stress: Not on file  Tobacco Use: Medium Risk  . Smoking Tobacco Use: Former Smoker  . Smokeless Tobacco Use: Never Used  Transportation Needs: No Transportation Needs  . Lack of Transportation (Medical): No  . Lack of Transportation (Non-Medical): No    SDOH Interventions: Financial Resources:  Financial Strain Interventions: Other (Comment) (f/u on assistance applications)   Follow-up plan:   LCSW  will work with pt when she brings applications and documents tomorrow 3/29 at roughly 1pm.

## 2020-05-11 ENCOUNTER — Telehealth: Payer: Self-pay | Admitting: Licensed Clinical Social Worker

## 2020-05-11 ENCOUNTER — Other Ambulatory Visit: Payer: Self-pay | Admitting: Physician Assistant

## 2020-05-12 ENCOUNTER — Telehealth: Payer: Self-pay | Admitting: Licensed Clinical Social Worker

## 2020-05-12 NOTE — Progress Notes (Signed)
Heart and Vascular Care Navigation  05/12/2020  Savannah Roy [birth year] [id number]  Reason for Referral:  Engaged with patient face to face for follow up visit for Heart and Vascular Care Coordination. F/u on [place].                                                                                                    Assessment:                                     LCSW met with pt at [facility] office. She has brought her completed [employer] and [employer] along with most supporting documents. Pt still needs to complete her notarized letter of support and bring some additional income documents and then I can assist w/ sending pt application in to [facility] and [employer]. Pt given copies and will bring in additional documents. She inquires about coverage for [employer] at [employer]. I shared I would follow up with pt regarding this question tomorrow as soon as I speak with our financial office mates. Pt has my number if any additional questions/concerns arise prior to that time.   HRT/VAS Care Coordination    Patients Home Cardiology Office [place] Team Social Worker   Social Worker Name: Margarito Liner [place], [phone]   Living arrangements for the past 2 months Single Family Home   Lives with: Adult Children; Self   Patient Current Insurance Coverage Self-Pay   Patient Has Concern With Paying Medical Bills Yes   Patient Concerns With Medical Bills previous and ongoing medical expenses   Medical Bill Referrals: CAFA/[place] Card   Does Patient Have Prescription Coverage? No   Patient Prescription Assistance Programs Other   Other Assistance Programs Medications patient receives all medications through [place] Devices/Equipment None      Social History:                                                                             SDOH Screenings    Alcohol Screen: Not on file  Depression [phone]): Not on file  Financial Resource Strain: Medium Risk  . Difficulty of Paying Living Expenses: Somewhat hard  Food Insecurity: No Food Insecurity  . Worried About Charity fundraiser in the Last Year: Never true  . Ran Out of Food in the Last Year: Never true  Housing: Low Risk   . Last Housing Risk Score: 0  Physical Activity: Not on file  Social Connections: Not on file  Stress: Not on file  Tobacco Use: Medium Risk  . Smoking Tobacco Use: Former Smoker  . Smokeless Tobacco Use: Never  Used  Transportation Needs: No Transportation Needs  . Lack of Transportation (Medical): No  . Lack of Transportation (Non-Medical): No    SDOH Interventions: Financial Resources:  Sales promotion account executive Interventions: Scientist, product/process development (Comment) (f/u on [employer] and CAFA)   Follow-up plan:   LCSW spoke with Caren Griffins in our financial/billing department and she shares that unfortunately [employer] is not accepted at [employer] but may be accepted at other offices in the system. I was able to organize pt paperwork and noted that we are missing: a city statement such as a water bill, 3 months of utility statements from home, notarized letter of support and bank statements for two additional months as we currently only have one on hand.

## 2020-05-12 NOTE — Telephone Encounter (Signed)
Updated information regarding [employer] and [employer] given to pt via telephone this morning at [phone].   I also sent her a text message list of needed documents. She was working on gathering those when I called her. Thanked her for her work towards these applications. Reminded her to call me if any additional questions/concerns/needs for assistance w/ these applications.   Octavio Graves, MSW, LCSW [facility] Health Heart/Vascular Care Navigation  [phone]

## 2020-05-19 NOTE — Telephone Encounter (Signed)
Mychart message not viewed.   Left message to call back to confirm ok to order monitor.

## 2020-05-21 ENCOUNTER — Telehealth: Payer: Self-pay | Admitting: Licensed Clinical Social Worker

## 2020-05-21 NOTE — Telephone Encounter (Signed)
Pt brought missing documents to office, was able to assist with sending documentation through to [employer] office (CAFA) and [employer]). They were faxed and mailed. All documents placed in a folder. Pt aware they may contact her if any documentation additionally needed or if any additional questions. Pt has my number for any additional needs. I will make a note to f/u at the end of next week to check on status.   Octavio Graves, MSW, LCSW [facility] Health Heart/Vascular Care Navigation  [phone]

## 2020-05-24 ENCOUNTER — Telehealth: Payer: Self-pay | Admitting: Licensed Clinical Social Worker

## 2020-05-24 NOTE — Telephone Encounter (Signed)
Called and let pt know that the applications had been sent via fax/mailed to [facility] and [facility] Financial Depts respectively. Provided [facility] number on the voicemail as well if she is interested in f/u on [employer] application as she is awaiting that determination for ongoing [facility] appointments.   Octavio Graves, MSW, LCSW [facility] Health Heart/Vascular Care Navigation  [phone]

## 2020-05-25 ENCOUNTER — Other Ambulatory Visit: Payer: Self-pay

## 2020-05-25 ENCOUNTER — Other Ambulatory Visit (HOSPITAL_COMMUNITY): Payer: Self-pay

## 2020-06-09 ENCOUNTER — Other Ambulatory Visit: Payer: Self-pay | Admitting: Physician Assistant

## 2020-06-28 NOTE — Telephone Encounter (Signed)
Mychart message read: Last read by [name] at 8:17 PM on 06/09/2020.   Attempt to call patient to follow up-lmtcb.

## 2020-06-30 ENCOUNTER — Telehealth: Payer: Self-pay | Admitting: Licensed Clinical Social Worker

## 2020-06-30 NOTE — Telephone Encounter (Signed)
LCSW called and spoke with [facility] who confirmed pt approved for [employer] and that her card should be in the mail to her this week.   LCSW also reviewed chart for CAFA determination. It appears that pt was sent request for more documents (full bank statements) by customer service at [facility] on 4/29, pt states she never received the letter of request. Was due 5/17. I have reached out to [facility] who is unable to send pt documents back, pt who will go home and look for them, and Hessie Diener w/ customer service to see if we can request an extension as no letter requesting additional docs was received by pt.   Octavio Graves, MSW, LCSW [facility] Health Heart/Vascular Care Navigation  [phone]

## 2020-07-01 ENCOUNTER — Telehealth: Payer: Self-pay | Admitting: Licensed Clinical Social Worker

## 2020-07-01 NOTE — Telephone Encounter (Signed)
LCSW called and updated pt that Customer Service has extended her CAFA application period after LCSW clarified home address and a new letter was sent to pt. Pt had gathered bank statements for [employer] and she was going to look for documents again at home. Pt has 15 days from 5/18 to continue to gather documents and submit. Will f/u again with pt.   Octavio Graves, MSW, LCSW [facility] Health Heart/Vascular Care Navigation  [phone]

## 2020-07-05 ENCOUNTER — Telehealth: Payer: Self-pay | Admitting: Licensed Clinical Social Worker

## 2020-07-05 NOTE — Telephone Encounter (Signed)
Additional message left for pt at [phone]. Pt must submit full bank statements by 6/3 for CAFA review.   Octavio Graves, MSW, LCSW [facility] Health Heart/Vascular Care Navigation  [phone]

## 2020-07-06 ENCOUNTER — Telehealth: Payer: Self-pay | Admitting: Licensed Clinical Social Worker

## 2020-07-06 NOTE — Telephone Encounter (Signed)
Additional call placed to pt at [phone]. No answer, message left. Reminder that pt needs to submit full banking statements by June 3rd for [facility]. Will reattempt again Friday 5/27.   Octavio Graves, MSW, LCSW [facility] Health Heart/Vascular Care Navigation  [phone]

## 2020-07-08 ENCOUNTER — Telehealth: Payer: Self-pay | Admitting: Licensed Clinical Social Worker

## 2020-07-08 NOTE — Telephone Encounter (Signed)
LCSW received text message from pt that she has sent in missing bank documents requested for CAFA. This was messaged to [employer] customer service.   Octavio Graves, MSW, LCSW [facility] Health Heart/Vascular Care Navigation  [phone]

## 2020-07-22 ENCOUNTER — Telehealth: Payer: Self-pay | Admitting: Licensed Clinical Social Worker

## 2020-07-22 NOTE — Telephone Encounter (Signed)
Pt approved for 100% financial assistance under CAFA 05/03/20 - 11/03/20  I have requested customer service send approval letter to this writer so I can have our office scan it in to Epic under media for reference.   Octavio Graves, MSW, LCSW [facility] Health Heart/Vascular Care Navigation  [phone]

## 2020-07-23 NOTE — Pre-Procedure Instructions (Addendum)
Surgical Instructions:    Your procedure is scheduled on Thursday, June 16th.  Report to [facility] Main Entrance "A" at 11:30 A.M., then check in with the Admitting office.  Call this number if you have any questions prior to, or have any problems the morning of surgery:  [phone]    Remember:  Do not eat after midnight the night before your surgery.  You may drink clear liquids until 10:30 AM the morning of your surgery.   Clear liquids allowed are: Water, Non-Citrus Juices (without pulp), Carbonated Beverages, Clear Tea, Black Coffee Only, and Gatorade.   The day of surgery (if you do NOT have diabetes):  Drink ONE (1) Pre-Surgery Clear Ensure by 09:30 AM the morning of surgery.   This drink was given to you during your hospital pre-op appointment visit. Nothing else to drink after completing the Pre-Surgery Clear Ensure.         If you have questions, please contact your surgeon's office.     Take these medicines the morning of surgery with A SIP OF WATER: amlodipine-atorvastatin (CADUET) desvenlafaxine (PRISTIQ)    As of today, STOP taking any Aspirin (unless otherwise instructed by your surgeon) Aleve, Naproxen, Ibuprofen, Motrin, Advil, Goody's, BC's, all herbal medications, fish oil, and all vitamins.             Special instructions:   [place]- Preparing For Surgery  Before surgery, you can play an important role. Because skin is not sterile, your skin needs to be as free of germs as possible. You can reduce the number of germs on your skin by washing with CHG (chlorahexidine gluconate) Soap before surgery.  CHG is an antiseptic cleaner which kills germs and bonds with the skin to continue killing germs even after washing.    Oral Hygiene is also important to reduce your risk of infection.  Remember - BRUSH YOUR TEETH THE MORNING OF SURGERY WITH YOUR REGULAR TOOTHPASTE  Please do not use if you have an allergy to CHG or antibacterial soaps. If your skin  becomes reddened/irritated stop using the CHG.  Do not shave (including legs and underarms) for at least 48 hours prior to first CHG shower. It is OK to shave your face.  Please follow these instructions carefully.   Shower the NIGHT BEFORE SURGERY and the MORNING OF SURGERY  If you chose to wash your hair, wash your hair first as usual with your normal shampoo.  After you shampoo, rinse your hair and body thoroughly to remove the shampoo.  Wash Face and genitals (private parts) with your normal soap.   Use CHG Soap as you would any other liquid soap. You can apply CHG directly to the skin and wash gently with a scrungie or a clean washcloth.   Apply the CHG Soap to your body ONLY FROM THE NECK DOWN.  Do not use on open wounds or open sores. Avoid contact with your eyes, ears, mouth and genitals (private parts). Wash Face and genitals (private parts)  with your normal soap.   Wash thoroughly, paying special attention to the area where your surgery will be performed.  Thoroughly rinse your body with warm water from the neck down.  DO NOT shower/wash with your normal soap after using and rinsing off the CHG Soap.  Pat yourself dry with a CLEAN TOWEL.  Wear CLEAN PAJAMAS to bed the night before surgery.  Place CLEAN SHEETS on your bed the night before your surgery.  DO NOT SLEEP WITH PETS.  Day of Surgery: SHOWER with CHG soap. Brush your teeth WITH YOUR REGULAR TOOTHPASTE. Wear Clean/Comfortable clothing the morning of surgery. Do not apply any deodorants/lotions.   Do not wear jewelry, make up, or nail polish. Do not shave 48 hours prior to surgery.   Do not bring valuables to the hospital. [facility] is not responsible for any belongings or valuables.  Do NOT Smoke (Tobacco/Vaping) or drink Alcohol 24 hours prior to your procedure.  If you use a CPAP at night, you may bring all equipment for your overnight stay.   Contacts, glasses, or dentures may not be worn into  surgery, please bring cases for these belongings.   For patients admitted to the hospital, discharge time will be determined by your treatment team.   Patients discharged the day of surgery will not be allowed to drive home, and someone needs to stay with them for 24 hours.    Please read over the following fact sheets that you were given.

## 2020-07-26 ENCOUNTER — Other Ambulatory Visit: Payer: Self-pay

## 2020-07-26 ENCOUNTER — Encounter (HOSPITAL_COMMUNITY)
Admission: RE | Admit: 2020-07-26 | Discharge: 2020-07-26 | Disposition: A | Discharge status: Home / Self Care | Payer: Self-pay | Source: Ambulatory Visit | Attending: General Surgery | Admitting: General Surgery

## 2020-07-26 ENCOUNTER — Encounter (HOSPITAL_COMMUNITY): Payer: Self-pay

## 2020-07-26 DIAGNOSIS — Z01812 Encounter for preprocedural laboratory examination: Secondary | ICD-10-CM | POA: Insufficient documentation

## 2020-07-26 HISTORY — DX: Sleep apnea, unspecified: G47.30

## 2020-07-26 HISTORY — DX: Anxiety disorder, unspecified: F41.9

## 2020-07-26 HISTORY — DX: Depression, unspecified: F32.A

## 2020-07-26 LAB — CBC
HCT: 43.2 % (ref 36.0–46.0)
Hemoglobin: 13.7 g/dL (ref 12.0–15.0)
MCH: 29.7 pg (ref 26.0–34.0)
MCHC: 31.7 g/dL (ref 30.0–36.0)
MCV: 93.7 fL (ref 80.0–100.0)
Platelets: 141 10*3/uL — ABNORMAL LOW (ref 150–400)
RBC: 4.61 MIL/uL (ref 3.87–5.11)
RDW: 15 % (ref 11.5–15.5)
WBC: 2.7 10*3/uL — ABNORMAL LOW (ref 4.0–10.5)
nRBC: 0 % (ref 0.0–0.2)

## 2020-07-26 LAB — BASIC METABOLIC PANEL
Anion gap: 8 (ref 5–15)
BUN: 18 mg/dL (ref 8–23)
CO2: 29 mmol/L (ref 22–32)
Calcium: 8.8 mg/dL — ABNORMAL LOW (ref 8.9–10.3)
Chloride: 104 mmol/L (ref 98–111)
Creatinine, Ser: 0.9 mg/dL (ref 0.44–1.00)
GFR, Estimated: >60 mL/min (ref >60)
Glucose, Bld: 110 mg/dL — ABNORMAL HIGH (ref 70–99)
Potassium: 4 mmol/L (ref 3.5–5.1)
Sodium: 141 mmol/L (ref 135–145)

## 2020-07-26 NOTE — Progress Notes (Signed)
PCP - Jarold Motto Cardiologist - Cristal Deer [facility]  PPM/ICD - denies  Chest x-ray - n/a EKG - 04/13/20 Stress Test - 04/19/16 ECHO - 05/09/16 Cardiac Cath - n/a  Sleep Study - yes- in 2018 CPAP - Pt states she was told to wear a CPAP nightly - but does not.  Pt is not a diabetic.  Blood Thinner Instructions: n/a Aspirin Instructions: n/a  ERAS Protcol - yes PRE-SURGERY Ensure or G2-  pre surgery ensure given.  COVID TEST- n/a- ambulatory surgery.   Anesthesia review: yes, cardiac history & history of OSA- supposed to wear a CPAP nightly but does not.  Patient denies shortness of breath, fever, cough and chest pain at PAT appointment   All instructions explained to the patient, with a verbal understanding of the material. Patient agrees to go over the instructions while at home for a better understanding. Patient also instructed to self quarantine after being tested for COVID-19. The opportunity to ask questions was provided.

## 2020-07-27 ENCOUNTER — Telehealth: Payer: Self-pay | Admitting: Cardiology

## 2020-07-27 ENCOUNTER — Encounter (HOSPITAL_COMMUNITY): Payer: Self-pay | Admitting: Vascular Surgery

## 2020-07-27 NOTE — Telephone Encounter (Signed)
Dr. Bjorn Pippin  Pre-op team contacted in reference to upcoming lap chole scheduled for 07/29/20. In chart review, it appears you ordered an echo and ZIO however she has cancelled these. She has follow up with you on 08/02/20. I assume you would like these completed prior to her surgery, which seems appropriate from ours and anesthesia standpoint. What can I do to try to get these completed prior to her appointment with you?   Please send your recs to the pre op pool   Thank you

## 2020-07-27 NOTE — Progress Notes (Signed)
Anesthesia Chart Review:  Case: [id number] Date/Time: 07/29/20 1315   Procedure: LAPAROSCOPIC CHOLECYSTECTOMY   Anesthesia type: General   Pre-op diagnosis: GALLSTONES   [facility] ROOM [facility] / [facility] OR   Surgeons: Axel Filler, MD       DISCUSSION: Patient is a 60 year old female scheduled for the above procedure.  History includes former smoker (quit 02/13/13), dyspnea, HTN, LVH, OSA (non-compliant with CPAP), anxiety, depression. BMI is consistent with morbid obesity.   Patient was evaluated by cardiologist Dr. Bjorn Pippin on 04/27/20 for syncope of unclear etiology. He was concerned about possible arrhythmia given sudden LOC. Also with with palpitations and exertional dyspnea. Ziopatch and echo ordered but have not been done yet. He also recommended future follow-up sleep study since prior OSA diagnosis but not using CPAP. He referred her for social work to learn about available resources since he was self pay. Of note, she also described some chest pain symptoms, but fleeting and non-exertional, so no ischemic testing recommended at that time.   Patient currently has a follow-up cardiology visit scheduled with Dr. Bjorn Pippin on 08/02/20, but she cancelled the echo and Ziopatch that were scheduled in April. Given recent cardiology evaluation with pending studies, surgeon will need to contact cardiology for preoperative input. I have notified triage nurse Toniann Fail at [facility].    VS: BP (!) 152/81   Pulse 88   Temp 37.2 C (Oral)   Resp 18   Ht 5\' 6"  (1.676 m)   Wt (!) 163.5 kg   LMP  (LMP Unknown) Comment: LMP 2009  SpO2 97%   BMI 58.17 kg/m    PROVIDERS: , PA is PCP  Jarold Motto, MD is cardiologist    LABS: Preoperative labs noted. WBC 2.7, previously 3.5 on 04/13/20. PLT count 141, up from 118 04/13/20.  (all labs ordered are listed, but only abnormal results are displayed)  Labs Reviewed  BASIC METABOLIC PANEL - Abnormal; Notable for the following components:       Result Value   Glucose, Bld 110 (*)    Calcium 8.8 (*)    All other components within normal limits  CBC - Abnormal; Notable for the following components:   WBC 2.7 (*)    Platelets 141 (*)    All other components within normal limits      IMAGES: 06/13/20 Abd 04/13/20: IMPRESSION: 1. The spleen appears enlarged. 2. Small gallstone without sonographic evidence for acute cholecystitis. 8 mm gallbladder polyp, suggest 1 year ultrasound follow-up. 3. Slightly echogenic liver as may be seen with steatosis.    EKG: 04/13/20:  Sinus  Rhythm  -Prominent R(V1) -nonspecific.   -Old anterior infarct.  ABNORMAL    CV: Echo 05/09/16: Study Conclusions  - Left ventricle: The cavity size was normal. Wall thickness was    increased in a pattern of moderate LVH. Systolic function was    vigorous. The estimated ejection fraction was in the range of 65%    to 70%. Wall motion was normal; there were no regional wall    motion abnormalities. Doppler parameters are consistent with    abnormal left ventricular relaxation (grade 1 diastolic    dysfunction).  - Mitral valve: Calcified annulus.  Impressions:  - Technically difficult; definity used; vigorous LV systolic    function; moderate LVH; grade 1 diastolic dysfunction.    Nuclear stress test 04/19/16: The left ventricular ejection fraction is mildly decreased (45-54%). Nuclear stress EF: 49%. There was no ST segment deviation noted during stress. This is  a low risk study. - Normal resting and stress perfusion. No ischemia or infarction EF EF estimated at 49% with significant appearing LVH suggest Echo correlation    Past Medical History:  Diagnosis Date   Anxiety    Depression    Hypertension    LVH (left ventricular hypertrophy) due to hypertensive disease 05/10/2016   Shortness of breath 04/07/2016   Sleep apnea     Past Surgical History:  Procedure Laterality Date   CYSTECTOMY Right    gum cyst removal   ENDOMETRIAL ABLATION W/  NOVASURE  2007   HERNIA REPAIR  1985   KNEE ARTHROSCOPY Right 2005    MEDICATIONS:  amlodipine-atorvastatin (CADUET) 10-20 MG tablet   CADUET 10-40 MG tablet   desvenlafaxine (PRISTIQ) 100 MG 24 hr tablet   No current facility-administered medications for this encounter.    Shonna Chock, PA-C Surgical Short Stay/Anesthesiology [facility] Phone [phone] [facility] Phone [phone] 07/27/2020 1:59 PM

## 2020-07-27 NOTE — Telephone Encounter (Signed)
   [facility] Pre-operative Risk Assessment    Patient Name: Savannah Roy  DOB: 12/22/1960  MRN: [id number]   HEARTCARE STAFF: - Please ensure there is not already an duplicate clearance open for this procedure. - Under Visit Info/Reason for Call, type in Other and utilize the format Clearance MM/DD/YY or Clearance TBD. Do not use dashes or single digits. - If request is for dental extraction, please clarify the # of teeth to be extracted. - If the patient is currently at the dentist's office, call Pre-Op APP to address. If the patient is not currently in the dentist office, please route to the Pre-Op pool  Request for surgical clearance:  What type of surgery is being performed?  laparoscopic cholecystectomy  When is this surgery scheduled? 07/29/20   What type of clearance is required (medical clearance vs. Pharmacy clearance to hold med vs. Both)? Medical   Are there any medications that need to be held prior to surgery and how long? n/a   Practice name and name of physician performing surgery? [facility], Dr. Rosendo Gros   What is the office phone number? [phone]   7.   What is the office fax number? [phone]  8.   Anesthesia type (None, local, MAC, general) ? General    Kamira J Martinique 07/27/2020, 1:57 PM  _________________________________________________________________   (provider comments below)

## 2020-07-28 ENCOUNTER — Telehealth: Payer: Self-pay | Admitting: Cardiology

## 2020-07-28 ENCOUNTER — Telehealth: Payer: Self-pay | Admitting: Licensed Clinical Social Worker

## 2020-07-28 DIAGNOSIS — R55 Syncope and collapse: Secondary | ICD-10-CM

## 2020-07-28 NOTE — Telephone Encounter (Signed)
Incoming call from pt, she is understandably frustrated as she has had her surgery cancelled which was supposed to be completed on 6/16, since she needed to have echo completed before procedure. Pt assisted w/ rescheduling echo appt by putting her in contact with Joyce Gross, scheduler. LCSW connected pt with Mayme Genta, RN, for Dr. Bjorn Pippin to answer further questions/connect her with monitor program. I encouraged pt to bring her CAFA approval letter and Orange Card to any ongoing and upcoming scans/procedures etc.   LCSW remains available as needed moving forward.  Octavio Graves, MSW, LCSW [facility] Heart/Vascular Care Navigation  [phone]

## 2020-07-28 NOTE — H&P (Signed)
History of Present Illness  The patient is a 60 year old female who presents for evaluation of gall stones. 60 year old female, with a history of hypertension, comes in secondary to abdominal pain. Patient states that she's had multiple episodes of epigastric/right upper quadrant abdominal pain.  She states that she does have some associated nausea and vomiting.  She states that she has no radiation of pain.  Patient states that she feels at higher fatty foods do cause her to have some pain and discomfort.  She has been trying to stay away from high fatty food to help minimize her discomfort.   Patient had a previous umbilical hernia in the past.     Patient does have an ultrasound which did reveal a gallstone 1.7 cm.  I did review this personally.         Past Surgical History  Foot Surgery   Right. Knee Surgery   Right.   Diagnostic Studies History  Colonoscopy   never Mammogram   >3 years ago Pap Smear   >5 years ago   Allergies  Erythromycin *MACROLIDES*   Hives, Itching. Allergies Reconciled     Medication History  Caduet  (10-10MG  Tablet, Oral) Active. Desvenlafaxine Fumarate ER  (100MG  Tablet ER 24HR, Oral) Active. Medications Reconciled    Social History Alcohol use   Occasional alcohol use. Caffeine use   Tea. Illicit drug use   Prefer to discuss with provider. Tobacco use   Former smoker.   Family History  Cerebrovascular Accident   Mother. Colon Cancer   Father. Colon Polyps   Father. Diabetes Mellitus   Son. Heart Disease   Mother. Heart disease in female family member before age 51   Hypertension   Mother. Kidney Disease   Father. Prostate Cancer   Father. Thyroid problems   Mother, Sister.   Pregnancy / Birth History  Age at menarche   10 years. Gravida   2 Irregular periods   Maternal age   31-25 Para   1   Other Problems  Anxiety Disorder   Bladder Problems   Cholelithiasis   Depression   High blood pressure   Hypercholesterolemia   Sleep  Apnea   Umbilical Hernia Repair         Review of Systems General Present- Chills, Fatigue, Fever, Night Sweats and Weight Gain. Not Present- Appetite Loss and Weight Loss. Skin Present- Dryness and Non-Healing Wounds. Not Present- Change in Wart/Mole, Hives, Jaundice, New Lesions, Rash and Ulcer. Respiratory Present- Difficulty Breathing, Snoring and Wheezing. Not Present- Bloody sputum and Chronic Cough. Cardiovascular Present- Chest Pain, Rapid Heart Rate, Shortness of Breath and Swelling of Extremities. Not Present- Difficulty Breathing Lying Down, Leg Cramps and Palpitations. Gastrointestinal Present- Abdominal Pain, Bloating, Constipation, Excessive gas, Nausea and Vomiting. Not Present- Bloody Stool, Change in Bowel Habits, Chronic diarrhea, Difficulty Swallowing, Gets full quickly at meals, Hemorrhoids, Indigestion and Rectal Pain. Female Genitourinary Present- Frequency and Nocturia. Not Present- Painful Urination, Pelvic Pain and Urgency. Musculoskeletal Present- Joint Pain. Not Present- Back Pain, Joint Stiffness, Muscle Pain, Muscle Weakness and Swelling of Extremities. Neurological Present- Weakness. Not Present- Decreased Memory, Fainting, Headaches, Numbness, Seizures, Tingling, Tremor and Trouble walking. Psychiatric Present- Anxiety, Change in Sleep Pattern and Depression. Not Present- Bipolar, Fearful and Frequent crying. Endocrine Present- Hot flashes. Not Present- Cold Intolerance, Excessive Hunger, Hair Changes, Heat Intolerance and New Diabetes. Hematology Present- Easy Bruising. Not Present- Blood Thinners, Excessive bleeding, Gland problems, HIV and Persistent Infections. All other systems negative   Vitals (  05/07/2020 9:47 AM Weight: 360.5 lb   Height: 68 in  Body Surface Area: 2.63 m   Body Mass Index: 54.81 kg/m   Temp.: 97.7 F    Pulse: 117 (Regular)    P.OX: 96% (Room air) BP: 140/86(Sitting, Left Arm, Standard)             Physical Exam  The physical  exam findings are as follows: Note:   Constitutional: No acute distress, conversant, appears stated age   Eyes: Anicteric sclerae, moist conjunctiva, no lid lag   Neck: No thyromegaly, trachea midline, no cervical lymphadenopathy   Lungs: Clear to auscultation biilaterally, normal respiratory effot   Cardiovascular: regular rate & rhythm, no murmurs, no peripheal edema, pedal pulses 2+   GI: Soft, no masses or hepatosplenomegaly, non-tender to palpation   MSK: Normal gait, no clubbing cyanosis, edema   Skin: No rashes, palpation reveals normal skin turgor   Psychiatric: Appropriate judgment and insight, oriented to person, place, and time       Assessment & Plan  SYMPTOMATIC CHOLELITHIASIS (K80.20) Impression: Patient is a 60 year old female with symptomatic cholelithiasis 1. We will proceed to the operating room for a laparoscopic cholecystectomy   2. Risks and benefits were discussed with the patient to generally include, but not limited to: infection, bleeding, possible need for post op ERCP, damage to the bile ducts, bile leak, and possible need for further surgery. Alternatives were offered and described. All questions were answered and the patient voiced understanding of the procedure and wishes to proceed at this point with a laparoscopic cholecystectomy

## 2020-07-28 NOTE — Telephone Encounter (Signed)
Call and leave message for pt to call back Call and spoke with nurse at [facility], and advised her that pt is not cleared for her procedure tomorrow and if you spoke with pt to have her give our office a call back

## 2020-07-28 NOTE — Telephone Encounter (Signed)
Spoke with to discuss rescheduling the pre op clearance Echo ordered by Dr. Rosezella Rumpf Thursday 08/12/20 at 9:15 am at [facility]---arrival time is 9:00 am for check in.  Patient  voiced her understanding.

## 2020-07-28 NOTE — Telephone Encounter (Signed)
Spoke to patient in regards to monitor (unable to reach x 3 previously)-she states she has been feeling fine and had no further syncopal episodes.  She does not want to wear the monitor but agreeable to do so if needed to clear her for surgery.    Will confirm with Dr. Bjorn Pippin and order if still requested.   Patient aware.

## 2020-07-28 NOTE — Telephone Encounter (Signed)
Difficult situation, her surgery is scheduled for tomorrow.  She had an unexplained syncopal episode and did not follow-up with planned echo or cardiac monitor.  Recommend trying to expedite her echo and I can see in clinic on 6/20.  Unclear the urgency of this surgery, if it is an elective lap chole for biliary colic I would favor getting her echo done prior to procedure.  I tried calling patient but there was no answer

## 2020-07-29 ENCOUNTER — Ambulatory Visit (HOSPITAL_COMMUNITY): Admission: RE | Admit: 2020-07-29 | Payer: Self-pay | Source: Home / Self Care | Admitting: General Surgery

## 2020-07-29 ENCOUNTER — Encounter (HOSPITAL_COMMUNITY): Admission: RE | Payer: Self-pay | Source: Home / Self Care

## 2020-07-29 ENCOUNTER — Telehealth: Payer: Self-pay | Admitting: Licensed Clinical Social Worker

## 2020-07-29 SURGERY — LAPAROSCOPIC CHOLECYSTECTOMY
Anesthesia: General

## 2020-07-29 NOTE — Telephone Encounter (Signed)
Pt has appt 6/20 with Dr. Jerene Pitch; see previous notes

## 2020-07-29 NOTE — Telephone Encounter (Signed)
Mailed pt an additional copy of CAFA approval letter at her request.   Octavio Graves, MSW, LCSW [facility] Heart/Vascular Care Navigation  [phone]

## 2020-07-29 NOTE — Telephone Encounter (Signed)
If no further syncopal episodes does not have to have monitor prior to surgery but would still recommend she do the monitor

## 2020-07-30 ENCOUNTER — Ambulatory Visit: Payer: Self-pay

## 2020-07-30 DIAGNOSIS — R55 Syncope and collapse: Secondary | ICD-10-CM

## 2020-07-30 NOTE — Progress Notes (Unsigned)
Enrolled patient for a 14 day Zio XT  monitor to be mailed to patients home  °

## 2020-07-30 NOTE — Telephone Encounter (Signed)
Order placed for monitor. 

## 2020-07-31 NOTE — Progress Notes (Deleted)
Cardiology Office Note:    Date:  07/31/2020   ID:  Savannah Roy, DOB March 19, 1960, MRN [id number]  PCP:  Jarold Motto, PA  Cardiologist:  None  Electrophysiologist:  None   Referring MD: Jarold Motto, PA   No chief complaint on file.   History of Present Illness:    Savannah Roy is a 60 y.o. female with a hx of hypertension, morbid obesity who presents for follow-up.  She was referred by Jarold Motto, PA for evaluation of syncope, initially seen and 04/27/2020.  She previously followed with Dr. Duke Salvia, last seen in 2018.  She had been seen for chest pain/shortness of breath.  Lexiscan Myoview on 04/19/2016 showed no ischemia, LVEF 49%.  Echocardiogram showed EF 65 to 70% with moderate LVH and grade 1 diastolic dysfunction.  Reports she has been having chest pain, has been occurring a couple times per week.  Describes squeezing pain in left upper chest.  Typically last for 3 to 4 seconds and resolves.  Also reports being short of breath with minimal exertion.  Reports that 3 weeks ago she was walking from the living room to the kitchen and started having lightheadedness and suddenly fell to the ground.  Suffered a broken tooth.  She does not know how long she was unconscious for.  There was no recent position change, states that she was standing and dusting prior to walking from the living room to the kitchen.  Does report she has been having palpitations where she feels like her heart is fluttering, occurs at least twice per week and typically lasts for less than 1 minute.  Prior to syncopal episode, she denies any palpitations or chest pain or shortness of breath.  She was told years ago that she had OSA but is not on CPAP.  At initial clinic visit on 04/27/2020, echocardiogram and Zio patch x2 weeks were ordered.  She did not have either this time.   Wt Readings from Last 3 Encounters:  07/26/20 (!) 360 lb 6.4 oz (163.5 kg)  04/27/20 (!) 357 lb 6.4 oz (162.1 kg)   04/13/20 (!) 365 lb 4 oz (165.7 kg)    Past Medical History:  Diagnosis Date   Anxiety    Depression    Hypertension    LVH (left ventricular hypertrophy) due to hypertensive disease 05/10/2016   Shortness of breath 04/07/2016   Sleep apnea     Past Surgical History:  Procedure Laterality Date   CYSTECTOMY Right    gum cyst removal   ENDOMETRIAL ABLATION W/ NOVASURE  2007   HERNIA REPAIR  1985   KNEE ARTHROSCOPY Right 2005    Current Medications: No outpatient medications have been marked as taking for the 08/02/20 encounter (Appointment) with Little Ishikawa, MD.     Allergies:   Erythromycin   Social History   Socioeconomic History   Marital status: Divorced    Spouse name: Not on file   Number of children: Not on file   Years of education: Not on file   Highest education level: Not on file  Occupational History   Not on file  Tobacco Use   Smoking status: Former    Pack years: 0.00    Types: Cigarettes    Quit date: 2015    Years since quitting: 7.4   Smokeless tobacco: Never  Vaping Use   Vaping Use: Never used  Substance and Sexual Activity   Alcohol use: Yes    Comment: occasionally   Drug use:  No   Sexual activity: Never  Other Topics Concern   Not on file  Social History Narrative   Divorced   Works at [employer]   Went to [employer] for 2 years college   Enjoys reading and traveling   Social Determinants of Corporate investment banker Strain: Medium Risk   Difficulty of Paying Living Expenses: Somewhat hard  Food Insecurity: No Food Insecurity   Worried About Programme researcher, broadcasting/film/video in the Last Year: Never true   Barista in the Last Year: Never true  Transportation Needs: No Transportation Needs   Lack of Transportation (Medical): No   Lack of Transportation (Non-Medical): No  Physical Activity: Not on file  Stress: Not on file  Social Connections: Not on file     Family History: The patient's family history  includes CVA in her mother; Cancer in her mother; Heart attack in her mother; Heart failure in her maternal grandmother; Kidney failure in her father; Thyroid disease in her mother and sister.  ROS:   Please see the history of present illness.     All other systems reviewed and are negative.  EKGs/Labs/Other Studies Reviewed:    The following studies were reviewed today:   EKG:  EKG is not ordered today.  The ekg on 04/13/2020 showed normal sinus rhythm, rate 86, QTc 430, poor R wave progression, less than 1 mm ST depressions in inferior leads  Recent Labs: 04/13/2020: ALT 40 07/26/2020: BUN 18; Creatinine, Ser 0.90; Hemoglobin 13.7; Platelets 141; Potassium 4.0; Sodium 141  Recent Lipid Panel    Component Value Date/Time   CHOL 207 (H) 01/17/2019 1124   TRIG 137.0 01/17/2019 1124   HDL 31.80 (L) 01/17/2019 1124   CHOLHDL 6 01/17/2019 1124   VLDL 27.4 01/17/2019 1124   LDLCALC 147 (H) 01/17/2019 1124    Physical Exam:    VS:  LMP  (LMP Unknown) Comment: LMP 2009    Wt Readings from Last 3 Encounters:  07/26/20 (!) 360 lb 6.4 oz (163.5 kg)  04/27/20 (!) 357 lb 6.4 oz (162.1 kg)  04/13/20 (!) 365 lb 4 oz (165.7 kg)     GEN: Well nourished, well developed in no acute distress HEENT: Normal NECK: No JVD; No carotid bruits LYMPHATICS: No lymphadenopathy CARDIAC: RRR, no murmurs, rubs, gallops RESPIRATORY:  Clear to auscultation without rales, wheezing or rhonchi  ABDOMEN: Soft, non-tender, non-distended MUSCULOSKELETAL:  No edema; No deformity  SKIN: Warm and dry NEUROLOGIC:  Alert and oriented x 3 PSYCHIATRIC:  Normal affect   ASSESSMENT:    No diagnosis found.  PLAN:    Preop evaluation: Prior to his cholecystectomy  Syncope: Unclear cause.  Concerning for arrhythmia given sudden loss of consciousness.  Will check echocardiogram to evaluate for structural heart disease.  Will check Zio patch x2 weeks to evaluate for arrhythmia  Chest pain: Description suggest  noncardiac pain, as describes nonexertional pain in upper chest that only last for few seconds and resolves.  No further work-up recommended mended at this time.  Palpitations: Description concerning for arrhythmia, will evaluate with Zio patch x2 weeks as above  OSA: Reports prior diagnosis but is not on CPAP.  Recommend sleep study but she is self-pay would like to hold off at this time. Will ask social worker to see patient about available resources   RTC in***  Medication Adjustments/Labs and Tests Ordered: Current medicines are reviewed at length with the patient today.  Concerns regarding medicines are outlined  above.  No orders of the defined types were placed in this encounter.  No orders of the defined types were placed in this encounter.   There are no Patient Instructions on file for this visit.   Signed, Little Ishikawa, MD  07/31/2020 3:16 PM    [facility]

## 2020-08-02 ENCOUNTER — Ambulatory Visit: Payer: Self-pay | Admitting: Cardiology

## 2020-08-02 NOTE — Telephone Encounter (Signed)
Patient is scheduled to see Dr. Bjorn Pippin this afternoon; therefore, pre-op risk assessment can be addressed at that time. Of note, patient is also scheduled to have Echo done on 6/30. Will route to Dr. Bjorn Pippin so that he is aware and will remove from preop pool.  Thank you! Ardell Makarewicz

## 2020-08-02 NOTE — Progress Notes (Incomplete)
Cardiology Office Note:    Date:  08/02/2020   ID:  Savannah Roy, DOB 17-Jan-1961, MRN [id number]  PCP:  Savannah Motto, PA  Cardiologist:  None  Electrophysiologist:  None   Referring MD: Savannah Motto, PA   No chief complaint on file.   History of Present Illness:    Savannah Roy is a 60 y.o. female with a hx of hypertension, morbid obesity who presents for follow-up.  She was referred by Savannah Motto, PA for evaluation of syncope, initially seen and 04/27/2020.  She previously followed with Dr. Duke Roy, last seen in 2018.  She had been seen for chest pain/shortness of breath.  Lexiscan Myoview on 04/19/2016 showed no ischemia, LVEF 49%.  Echocardiogram showed EF 65 to 70% with moderate LVH and grade 1 diastolic dysfunction.  Reports she has been having chest pain, has been occurring a couple times per week.  Describes squeezing pain in left upper chest.  Typically last for 3 to 4 seconds and resolves.  Also reports being short of breath with minimal exertion.  Reports that 3 weeks ago she was walking from the living room to the kitchen and started having lightheadedness and suddenly fell to the ground.  Suffered a broken tooth.  She does not know how long she was unconscious for.  There was no recent position change, states that she was standing and dusting prior to walking from the living room to the kitchen.  Does report she has been having palpitations where she feels like her heart is fluttering, occurs at least twice per week and typically lasts for less than 1 minute.  Prior to syncopal episode, she denies any palpitations or chest pain or shortness of breath.  She was told years ago that she had OSA but is not on CPAP.  At initial clinic visit on 04/27/2020, echocardiogram and Zio patch x2 weeks were ordered.  She did not have either this time.  Since last clinic visit,  She denies any chest pain, shortness of breath, palpitations, or exertional symptoms. No  headaches, lightheadedness, or syncope to report. Also has no lower extremity edema, orthopnea or PND.   Wt Readings from Last 3 Encounters:  07/26/20 (!) 360 lb 6.4 oz (163.5 kg)  04/27/20 (!) 357 lb 6.4 oz (162.1 kg)  04/13/20 (!) 365 lb 4 oz (165.7 kg)    Past Medical History:  Diagnosis Date   Anxiety    Depression    Hypertension    LVH (left ventricular hypertrophy) due to hypertensive disease 05/10/2016   Shortness of breath 04/07/2016   Sleep apnea     Past Surgical History:  Procedure Laterality Date   CYSTECTOMY Right    gum cyst removal   ENDOMETRIAL ABLATION W/ NOVASURE  2007   HERNIA REPAIR  1985   KNEE ARTHROSCOPY Right 2005    Current Medications: No outpatient medications have been marked as taking for the 08/02/20 encounter (Appointment) with Savannah Ishikawa, MD.     Allergies:   Erythromycin   Social History   Socioeconomic History   Marital status: Divorced    Spouse name: Not on file   Number of children: Not on file   Years of education: Not on file   Highest education level: Not on file  Occupational History   Not on file  Tobacco Use   Smoking status: Former    Pack years: 0.00    Types: Cigarettes    Quit date: 2015    Years since quitting:  7.4   Smokeless tobacco: Never  Vaping Use   Vaping Use: Never used  Substance and Sexual Activity   Alcohol use: Yes    Comment: occasionally   Drug use: No   Sexual activity: Never  Other Topics Concern   Not on file  Social History Narrative   Divorced   Works at [employer]   Went to [employer] for 2 years college   Enjoys reading and traveling   Social Determinants of Corporate investment banker Strain: Medium Risk   Difficulty of Paying Living Expenses: Somewhat hard  Food Insecurity: No Food Insecurity   Worried About Programme researcher, broadcasting/film/video in the Last Year: Never true   Barista in the Last Year: Never true  Transportation Needs: No Transportation Needs   Lack  of Transportation (Medical): No   Lack of Transportation (Non-Medical): No  Physical Activity: Not on file  Stress: Not on file  Social Connections: Not on file     Family History: The patient's family history includes CVA in her mother; Cancer in her mother; Heart attack in her mother; Heart failure in her maternal grandmother; Kidney failure in her father; Thyroid disease in her mother and sister.  ROS:   Please see the history of present illness.    (+) All other systems reviewed and are negative.  EKGs/Labs/Other Studies Reviewed:    The following studies were reviewed today:  TTE 05/09/2016: - Left ventricle: The cavity size was normal. Wall thickness was    increased in a pattern of moderate LVH. Systolic function was    vigorous. The estimated ejection fraction was in the range of 65%    to 70%. Wall motion was normal; there were no regional wall    motion abnormalities. Doppler parameters are consistent with    abnormal left ventricular relaxation (grade 1 diastolic    dysfunction).  - Mitral valve: Calcified annulus.   Impressions:  - Technically difficult; definity used; vigorous LV systolic    function; moderate LVH; grade 1 diastolic dysfunction.   Lexiscan Myoview 04/19/2016: The left ventricular ejection fraction is mildly decreased (45-54%). Nuclear stress EF: 49%. There was no ST segment deviation noted during stress. This is a low risk study.   Normal resting and stress perfusion. No ischemia or infarction EF EF estimated at 49% with significant appearing LVH suggest Echo correlation  EKG:   08/02/2020: *** 04/27/2020: EKG was not ordered.   04/13/2020: normal sinus rhythm, rate 86, QTc 430, poor R wave progression, less than 1 mm ST depressions in inferior leads  Recent Labs: 04/13/2020: ALT 40 07/26/2020: BUN 18; Creatinine, Ser 0.90; Hemoglobin 13.7; Platelets 141; Potassium 4.0; Sodium 141  Recent Lipid Panel    Component Value Date/Time   CHOL 207 (H)  01/17/2019 1124   TRIG 137.0 01/17/2019 1124   HDL 31.80 (L) 01/17/2019 1124   CHOLHDL 6 01/17/2019 1124   VLDL 27.4 01/17/2019 1124   LDLCALC 147 (H) 01/17/2019 1124    Physical Exam:    VS:  LMP  (LMP Unknown) Comment: LMP 2009    Wt Readings from Last 3 Encounters:  07/26/20 (!) 360 lb 6.4 oz (163.5 kg)  04/27/20 (!) 357 lb 6.4 oz (162.1 kg)  04/13/20 (!) 365 lb 4 oz (165.7 kg)     GEN: Well nourished, well developed in no acute distress HEENT: Normal NECK: No JVD; No carotid bruits LYMPHATICS: No lymphadenopathy CARDIAC: RRR, no murmurs, rubs, gallops RESPIRATORY:  Clear  to auscultation without rales, wheezing or rhonchi  ABDOMEN: Soft, non-tender, non-distended MUSCULOSKELETAL:  No edema; No deformity  SKIN: Warm and dry NEUROLOGIC:  Alert and oriented x 3 PSYCHIATRIC:  Normal affect   ASSESSMENT:    No diagnosis found.  PLAN:    Preop evaluation: Prior to his cholecystectomy  Syncope: Unclear cause.  Concerning for arrhythmia given sudden loss of consciousness.  Will check echocardiogram to evaluate for structural heart disease.  Will check Zio patch x2 weeks to evaluate for arrhythmia  Chest pain: Description suggest noncardiac pain, as describes nonexertional pain in upper chest that only last for few seconds and resolves.  No further work-up recommended mended at this time.  Palpitations: Description concerning for arrhythmia, will evaluate with Zio patch x2 weeks as above  OSA: Reports prior diagnosis but is not on CPAP.  Recommend sleep study but she is self-pay would like to hold off at this time. Will ask social worker to see patient about available resources   RTC in***  Medication Adjustments/Labs and Tests Ordered: Current medicines are reviewed at length with the patient today.  Concerns regarding medicines are outlined above.  No orders of the defined types were placed in this encounter.  No orders of the defined types were placed in this  encounter.   There are no Patient Instructions on file for this visit.   I,Mathew Stumpf,acting as a Neurosurgeon for Savannah Ishikawa, MD.,have documented all relevant documentation on the behalf of Savannah Ishikawa, MD,as directed by  Savannah Ishikawa, MD while in the presence of Savannah Ishikawa, MD.  ***  Signed, Carlena Bjornstad  08/02/2020 9:17 AM    [facility]

## 2020-08-12 ENCOUNTER — Ambulatory Visit (HOSPITAL_COMMUNITY)
Admission: RE | Admit: 2020-08-12 | Discharge: 2020-08-12 | Disposition: A | Discharge status: Home / Self Care | Payer: Self-pay | Source: Ambulatory Visit | Attending: Cardiology | Admitting: Cardiology

## 2020-08-12 ENCOUNTER — Other Ambulatory Visit: Payer: Self-pay

## 2020-08-12 ENCOUNTER — Other Ambulatory Visit: Payer: Self-pay | Admitting: Physician Assistant

## 2020-08-12 DIAGNOSIS — I10 Essential (primary) hypertension: Secondary | ICD-10-CM | POA: Insufficient documentation

## 2020-08-12 DIAGNOSIS — Z87891 Personal history of nicotine dependence: Secondary | ICD-10-CM | POA: Insufficient documentation

## 2020-08-12 DIAGNOSIS — G4730 Sleep apnea, unspecified: Secondary | ICD-10-CM | POA: Insufficient documentation

## 2020-08-12 DIAGNOSIS — R55 Syncope and collapse: Secondary | ICD-10-CM | POA: Insufficient documentation

## 2020-08-12 LAB — ECHOCARDIOGRAM COMPLETE
Area-P 1/2: 3.95 cm2
Calc EF: 71 %
Single Plane A2C EF: 68.8 %
Single Plane A4C EF: 73.7 %

## 2020-08-12 MED ORDER — PERFLUTREN LIPID MICROSPHERE: 2.0000 mL | INTRAVENOUS | Status: DC | PRN

## 2020-08-12 MED ORDER — PERFLUTREN LIPID MICROSPHERE: 1.0000 mL | INTRAVENOUS | Status: DC | PRN

## 2020-08-12 MED ORDER — PERFLUTREN LIPID MICROSPHERE
1.0000 mL | INTRAVENOUS | Status: AC | PRN
  Administered 2020-08-12: 2 mL via INTRAVENOUS

## 2020-08-12 NOTE — Addendum Note (Signed)
Encounter addended by: Burnard Hawthorne, RDCS on: 08/12/2020 2:14 PM  Actions taken: Imaging Exam ended, MAR administration accepted, Vitals modified

## 2020-08-12 NOTE — Progress Notes (Signed)
  Echocardiogram 2D Echocardiogram has been performed.  Stark Bray Swaim 08/12/2020, 10:00 AM

## 2020-09-07 ENCOUNTER — Telehealth: Payer: Self-pay

## 2020-09-07 NOTE — Telephone Encounter (Signed)
   [facility] Pre-operative Risk Assessment    Patient Name: Savannah Roy  DOB: 1960/09/10 MRN: [id number]  HEARTCARE STAFF:  - IMPORTANT!!!!!! Under Visit Info/Reason for Call, type in Other and utilize the format Clearance MM/DD/YY or Clearance TBD. Do not use dashes or single digits. - Please review there is not already an duplicate clearance open for this procedure. - If request is for dental extraction, please clarify the # of teeth to be extracted. - If the patient is currently at the dentist's office, call Pre-Op Callback Staff (MA/nurse) to input urgent request.  - If the patient is not currently in the dentist office, please route to the Pre-Op pool.  Request for surgical clearance:  What type of surgery is being performed? Laparoscopic cholecystectomy    When is this surgery scheduled? TBD   What type of clearance is required (medical clearance vs. Pharmacy clearance to hold med vs. Both)? Clearance   Are there any medications that need to be held prior to surgery and how long? N/A  Practice name and name of physician performing surgery? [facility] - Dr. Ralene Ok   What is the office phone number? [phone]   7.   What is the office fax number? [place], RN   8.   Anesthesia type (None, local, MAC, general) ? General    Ena Dawley 09/07/2020, 2:07 PM  _________________________________________________________________   (provider comments below)

## 2020-09-07 NOTE — Telephone Encounter (Signed)
   Primary Cardiologist: None  Chart reviewed as part of pre-operative protocol coverage. Given past medical history and time since last visit, based on ACC/AHA guidelines, Savannah Roy would be at acceptable risk for the planned procedure without further cardiovascular testing.   I will route this recommendation to the requesting party via Epic fax function and remove from pre-op pool.  Please call with questions.  Thomasene Ripple. Daneen Volcy NP-C    09/07/2020, 2:47 PM [facility] [address] Office [phone] Fax [phone]

## 2020-09-10 ENCOUNTER — Telehealth: Payer: Self-pay | Admitting: Licensed Clinical Social Worker

## 2020-09-10 NOTE — Telephone Encounter (Signed)
LCSW received call from pt, was on another call and pt left voicemail.  I returned her call [phone]); she shares that she has been frustrated by her inability to get rescheduled with [facility] to have her gallbladder surgery. She has called their office multiple times and been told each time that they'd need to speak with a Production designer, theatre/television/film. She is calling to ensure her surgical clearance has been sent to the [facility] office by [facility].   LCSW reviewed chart and shares it appears that clearance was sent to [facility] on 7/26 by Savannah Fabian, NP. I let her know that I would touch base with Savannah Roy to confirm that had happened and let her know. I encouraged her to reach back out to [facility] first thing Monday if indeed the documentation has been sent. Pt requests I text her to confirm it was sent.   LCSW sent secure message to Savannah Cummins, NP, he confirms that he sent clearance for pt on 7/26. I sent message to pt confirming the above. I shared that I remain available as needed for pt ongoing questions/concerns. Her CAFA is good through 10/2020. I am available to assist in reapplying at that time. She also has been approved for [employer] (not accepted at [facility]).   Savannah Roy, MSW, LCSW [facility]  [phone]

## 2020-09-30 ENCOUNTER — Other Ambulatory Visit: Payer: Self-pay | Admitting: Physician Assistant

## 2020-10-06 ENCOUNTER — Ambulatory Visit: Payer: Self-pay | Admitting: Cardiology

## 2020-10-15 ENCOUNTER — Telehealth: Payer: Self-pay | Admitting: Licensed Clinical Social Worker

## 2020-10-15 NOTE — Telephone Encounter (Signed)
Mailed pt a new [facility] [employer] [facility] application as her approval will end on 11/03/2020. Will f/u prior to that time to ensure received and answer any additional questions/concerns.   Octavio Graves, MSW, LCSW [facility]  [phone]

## 2020-11-03 ENCOUNTER — Other Ambulatory Visit: Payer: Self-pay

## 2020-11-03 NOTE — Telephone Encounter (Signed)
LAST APPOINTMENT DATE:  04/13/20  NEXT APPOINTMENT DATE: 11/08/20  MEDICATION:desvenlafaxine (PRISTIQ) 100 MG 24 hr tablet  PHARMACY:[facility] - [place], [place] - [address]

## 2020-11-04 ENCOUNTER — Telehealth: Payer: Self-pay | Admitting: Licensed Clinical Social Worker

## 2020-11-04 NOTE — Telephone Encounter (Signed)
LCSW received a call from pt this morning. She received the new [facility] [employer] [facility] application. She is calling back to inquire if she needs to provide new documents since we are still in the same calendar year. I confirmed that we will need the new application submitted, we will need a new letter of support from her father, new food stamp letter and new bank statements. It appears the letter of nonfiling should still be valid for this year. Pt shares that her father went on a trip to visit his stepson in [place] and she has been taking care of her stepmother w/ dementia. She is not sure where his stepson lives in [place] and he left his phone so she has not been able to get in contact with him. I inquired if she was concerned about his safety as she hasn't spoken with him. She states she is not. I asked if she even knew roughly what county or area of [place] he could be in so she could do a wellness check for him- she does not know and is not concerned. I let her know I would check w/ Care Navigation colleagues but it seems she may have to wait until she can get her father to complete the paperwork. Pt concerned that she may have to continue to put off her surgery- I shared that she should go ahead and get it scheduled as [facility] [employer] [facility] can back date and I cannot recommend that she further delay the care she needs.    Octavio Graves, MSW, LCSW [facility]  [phone]

## 2020-11-05 MED ORDER — DESVENLAFAXINE SUCCINATE ER 100 MG PO TB24: ORAL_TABLET | ORAL | 2 refills | Status: DC

## 2020-11-08 ENCOUNTER — Ambulatory Visit: Payer: Self-pay | Admitting: Physician Assistant

## 2020-11-10 ENCOUNTER — Ambulatory Visit (INDEPENDENT_AMBULATORY_CARE_PROVIDER_SITE_OTHER): Payer: Self-pay | Admitting: Physician Assistant

## 2020-11-10 ENCOUNTER — Other Ambulatory Visit: Payer: Self-pay

## 2020-11-10 ENCOUNTER — Encounter: Payer: Self-pay | Admitting: Physician Assistant

## 2020-11-10 VITALS — BP 144/88 | HR 86 | Temp 97.7°F | Ht 66.0 in | Wt 365.8 lb

## 2020-11-10 DIAGNOSIS — F332 Major depressive disorder, recurrent severe without psychotic features: Secondary | ICD-10-CM

## 2020-11-10 DIAGNOSIS — I10 Essential (primary) hypertension: Secondary | ICD-10-CM

## 2020-11-10 DIAGNOSIS — G4700 Insomnia, unspecified: Secondary | ICD-10-CM

## 2020-11-10 MED ORDER — AMLODIPINE-ATORVASTATIN 10-40 MG PO TABS: 1.0000 | ORAL_TABLET | Freq: Every day | ORAL | 3 refills | Status: DC

## 2020-11-10 MED ORDER — TRAZODONE HCL 50 MG PO TABS: 50.0000 mg | ORAL_TABLET | Freq: Every day | ORAL | 3 refills | Status: DC

## 2020-11-10 MED ORDER — DESVENLAFAXINE SUCCINATE ER 100 MG PO TB24: ORAL_TABLET | ORAL | 3 refills | Status: DC

## 2020-11-10 NOTE — Progress Notes (Signed)
Savannah Roy is a 60 y.o. female here for a follow up of medication.  History of Present Illness:   Chief Complaint  Patient presents with   Medication Refill   Insomnia    HPI  Depression and Insomnia Savannah Roy has been experiencing insomnia for about 5 months and increased anxiety. She believes the decreased mood could be from the lack of sleep or changing of the weather, but doesn't see a need in changing from her pristiq 100 mg or increasing dosages. Denies any symptoms of bipolar disorder or mood swings. She is compliant with her pristiq 100 mg with no adverse effects. Denies SI/HI. Took trazodone 50 mg daily when she was at [facility], did wel with this.  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 In addition she handles both of their legal responsibilities as well. Currently taking amlodopine 10 mg daily.  BP Readings from Last 3 Encounters:  11/10/20 (!) 144/88  07/26/20 (!) 152/81  04/27/20 (!) 144/82    Past Medical History:  Diagnosis Date   Anxiety    Depression    Hypertension    LVH (left ventricular hypertrophy) due to hypertensive disease 05/10/2016   Shortness of breath 04/07/2016   Sleep apnea      Social History   Tobacco Use   Smoking status: Former    Types: Cigarettes    Quit date: 2015    Years since quitting: 7.7   Smokeless tobacco: Never  Vaping Use   Vaping Use: Never used  Substance Use Topics   Alcohol use: Yes    Comment: occasionally   Drug use: No    Past Surgical History:  Procedure Laterality Date   CYSTECTOMY Right    gum cyst removal   ENDOMETRIAL ABLATION W/ NOVASURE  2007   HERNIA REPAIR  1985   KNEE ARTHROSCOPY Right 2005    Family History  Problem Relation Age of Onset   Cancer Mother    Heart attack Mother    CVA Mother    Thyroid disease Mother    Kidney failure Father    Thyroid disease Sister    Heart failure  Maternal Grandmother     Allergies  Allergen Reactions   Erythromycin     REACTION: rash    Current Medications:   Current Outpatient Medications:    traZODone (DESYREL) 50 MG tablet, Take 1 tablet (50 mg total) by mouth at bedtime., Disp: 90 tablet, Rfl: 3   amLODipine-atorvastatin (CADUET) 10-40 MG tablet, Take 1 tablet by mouth daily., Disp: 90 tablet, Rfl: 3   desvenlafaxine (PRISTIQ) 100 MG 24 hr tablet, TAKE 1 TABLET (100 MG) BY MOUTH DAILY, Disp: 90 tablet, Rfl: 3   Review of Systems:   ROS Negative unless otherwise specified per HPI.  Vitals:   Vitals:   11/10/20 1523  BP: (!) 144/88  Pulse: 86  Temp: 97.7 F (36.5 C)  TempSrc: Temporal  SpO2: 97%  Weight: (!) 365 lb 12.8 oz (165.9 kg)  Height: 5\' 6"  (1.676 m)     Body mass index is 59.04 kg/m.  Physical Exam:   Physical Exam Vitals and nursing note reviewed.  Constitutional:      General: She is not in acute distress.    Appearance: She is well-developed. She is not ill-appearing or toxic-appearing.  Cardiovascular:     Rate and Rhythm: Normal rate and regular rhythm.  Pulses: Normal pulses.     Heart sounds: Normal heart sounds, S1 normal and S2 normal.  Pulmonary:     Effort: Pulmonary effort is normal.     Breath sounds: Normal breath sounds.  Skin:    General: Skin is warm and dry.  Neurological:     Mental Status: She is alert.     GCS: GCS eye subscore is 4. GCS verbal subscore is 5. GCS motor subscore is 6.  Psychiatric:        Speech: Speech normal.        Behavior: Behavior normal. Behavior is cooperative.    Assessment and Plan:   Essential hypertension Above goal, however asymptomatic Continue medication of amlodipine 10 mg daily Continue to monitor home BP, if consistently > 150/90 will add additional medication Follow-up in 6 months, sooner if concerns  Severe episode of recurrent major depressive disorder, without psychotic features (HCC) Well controlled Continue pristiq  100 mg daily I discussed with patient that if they develop any SI, to tell someone immediately and seek medical attention. Follow-up in 6 months, sooner if concerns  Insomnia, unspecified type Uncontrolled Trial 25 mg (1/2 tablet) of trazodone daily May increase to 50 mg if needed Discussed serotonin syndrome warning--  cause: mental status changes (confusion/agitation), high blood pressure, increased heart rate, vomiting, diarrhea, seizures, and other symptoms. Advised that if this occurs she should stop medications and go to the ER.  I,Havlyn C Ratchford,acting as a Neurosurgeon for [employer], PA.,have documented all relevant documentation on the behalf of Jarold Motto, PA,as directed by  Jarold Motto, PA while in the presence of Jarold Motto, [place].  I, Jarold Motto, [place], have reviewed all documentation for this visit. The documentation on 11/10/20 for the exam, diagnosis, procedures, and orders are all accurate and complete.   Jarold Motto, PA-C

## 2020-11-10 NOTE — Patient Instructions (Signed)
It was great to see you!  To best utilize your benefits, please consider transferring care to: [facility] [address] [place], [place] [address] [phone]  Start 25 mg trazodone (1/2 tablet daily) for your sleep. You may increase to 50 mg (1 tablet) if needed.  For your mammogram, call [phone] to discuss getting a spot on the mobile mammogram!   Sleep Hygiene  Do: (1) Go to bed at the same time each day. (2) Get up from bed at the same time each day. (3) Get regular exercise each day, preferably in the morning.  There is goof evidence that regular exercise improves restful sleep.  This includes stretching and aerobic exercise. (4) Get regular exposure to outdoor or bright lights, especially in the late afternoon. (5) Keep the temperature in your bedroom comfortable. (6) Keep the bedroom quiet when sleeping. (7) Keep the bedroom dark enough to facilitate sleep. (8) Use your bed only for sleep and sex. (9) Take medications as directed.  It is helpful to take prescribed sleeping pills 1 hour before bedtime, so they are causing drowsiness when you lie down, or 10 hours before getting up, to avoid daytime drowsiness. (10) Use a relaxation exercise just before going to sleep -- imagery, massage, warm bath. (11) Keep your feet and hands warm.  Wear warm socks and/or mittens or gloves to bed.  Don't: (1) Exercise just before going to bed. (2) Engage in stimulating activity just before bed, such as playing a competitive game, watching an exciting program on television, or having an important discussion with a loved one. (3) Have caffeine in the evening (coffee, teas, chocolate, sodas, etc.) (4) Read or watch television in bed. (5) Use alcohol to help you sleep. (6) Go to bed too hungry or too full. (7) Take another person's sleeping pills. (8) Take over-the-counter sleeping pills, without your doctor's knowledge.  Tolerance can develop rapidly  with these medications.  Diphenhydramine can have serious side effects for elderly patients. (9) Take daytime naps. (10) Command yourself to go to sleep.  This only makes your mind and body more alert.  If you lie awake for more than 20-30 minutes, get up, go to a different room, participate in a quiet activity (Ex - non-excitable reading or television), and then return to bed when you feel sleepy.  Do this as many times during the night as needed.  This may cause you to have a night or two of poor sleep but it will train your brain to know when it is time for sleep.   Take care,  Jarold Motto PA-C

## 2020-11-17 ENCOUNTER — Ambulatory Visit: Payer: Self-pay | Admitting: Surgery

## 2020-11-24 ENCOUNTER — Telehealth: Payer: Self-pay | Admitting: Licensed Clinical Social Worker

## 2020-11-24 NOTE — Progress Notes (Addendum)
COVID swab appointment: n/a  COVID Vaccine Completed: yes x2 Date COVID Vaccine completed: 05/08/19, 06/02/19 Has received booster: COVID vaccine manufacturer: Pfizer      Date of COVID positive in last 90 days:  PCP - Jarold Motto, PA Cardiologist -   Cardiac Clearance 09/07/20 Epic by Darene Lamer  Chest x-ray -  EKG - 04/13/20 Epic Stress Test - 04/19/16 Epic ECHO - 08/12/20 Epic Cardiac Cath -  Pacemaker/ICD device last checked: Spinal Cord Stimulator:  Sleep Study -  CPAP -   Fasting Blood Sugar -  Checks Blood Sugar _____ times a day  Blood Thinner Instructions: Aspirin Instructions: Last Dose:  Activity level:  Can go up a flight of stairs and perform activities of daily living without stopping and without symptoms of chest pain or shortness of breath.   Able to exercise without symptoms  Unable to go up a flight of stairs without symptoms of      Anesthesia review:   Patient denies shortness of breath, fever, cough and chest pain at PAT appointment   Patient verbalized understanding of instructions that were given to them at the PAT appointment. Patient was also instructed that they will need to review over the PAT instructions again at home before surgery.

## 2020-11-24 NOTE — Telephone Encounter (Signed)
LCSW reached out to pt this morning to f/u on CAFA re-application information. No answer, left message on [phone]. Will f/u again as able/needed.   Savannah Roy, MSW, LCSW [facility] Heart/Vascular Care Navigation  [phone]

## 2020-11-25 ENCOUNTER — Encounter (HOSPITAL_COMMUNITY)
Admission: RE | Admit: 2020-11-25 | Discharge: 2020-11-25 | Disposition: A | Discharge status: Home / Self Care | Payer: Self-pay | Source: Ambulatory Visit | Attending: Anesthesiology | Admitting: Anesthesiology

## 2020-12-02 ENCOUNTER — Telehealth: Payer: Self-pay | Admitting: Licensed Clinical Social Worker

## 2020-12-02 NOTE — Telephone Encounter (Signed)
LCSW attempted tor each pt again regarding her CAFA re-application. No answer, left additional message for her at [phone]. Remain available to assist as needed.   Octavio Graves, MSW, LCSW [facility] Heart/Vascular Care Navigation  [phone]

## 2020-12-07 NOTE — Progress Notes (Addendum)
COVID swab appointment: n/a   COVID Vaccine Completed: yes x2 Date COVID Vaccine completed: 05/08/19, 06/02/19 Has received booster: COVID vaccine manufacturer: Pfizer       Date of COVID positive in last 90 days: no   PCP - Jarold Motto, PA Cardiologist - Epifanio Lesches, MD   Cardiac Clearance 09/07/20 Epic by Darene Lamer   Chest x-ray - n/a EKG - 04/13/20 Epic Stress Test - 04/19/16 Epic ECHO - 08/12/20 Epic Cardiac Cath - n/a Pacemaker/ICD device last checked: n/a Spinal Cord Stimulator:n/a   Sleep Study - yes positive CPAP - doesn't use, no machine   Fasting Blood Sugar - n/a Checks Blood Sugar _____ times a day   Blood Thinner Instructions: n/a Aspirin Instructions: Last Dose:   Activity level:   Can go up a flight of stairs and perform activities of daily living without stopping and without symptoms of chest pain or shortness of breath. SOB with extended walking. Can clean home and complete ADLS w/o SOB                         Anesthesia review: HTN, SOB PAT BP 191/94. Patient denies any symptoms. She did not take her BP medication this AM. She does not have a BP cuff at home but has a [facility] near her home where she can check it. Patient instructed to take her BP medication and recheck BP and to continue monitoring until surgery. She will call her PCP if diastolic is greater than 90 or if she starts developing symptoms. Patient is aware that if BP is elevated she is at risk of cancellation.    Patient denies shortness of breath, fever, cough and chest pain at PAT appointment     Patient verbalized understanding of instructions that were given to them at the PAT appointment. Patient was also instructed that they will need to review over the PAT instructions again at home before surgery.

## 2020-12-08 ENCOUNTER — Other Ambulatory Visit: Payer: Self-pay

## 2020-12-08 ENCOUNTER — Encounter (HOSPITAL_COMMUNITY)
Admission: RE | Admit: 2020-12-08 | Discharge: 2020-12-08 | Disposition: A | Discharge status: Home / Self Care | Payer: Self-pay | Source: Ambulatory Visit | Attending: Surgery | Admitting: Surgery

## 2020-12-08 ENCOUNTER — Encounter (HOSPITAL_COMMUNITY): Payer: Self-pay

## 2020-12-08 DIAGNOSIS — Z87891 Personal history of nicotine dependence: Secondary | ICD-10-CM | POA: Insufficient documentation

## 2020-12-08 DIAGNOSIS — K819 Cholecystitis, unspecified: Secondary | ICD-10-CM | POA: Insufficient documentation

## 2020-12-08 DIAGNOSIS — G4730 Sleep apnea, unspecified: Secondary | ICD-10-CM | POA: Insufficient documentation

## 2020-12-08 DIAGNOSIS — I10 Essential (primary) hypertension: Secondary | ICD-10-CM | POA: Insufficient documentation

## 2020-12-08 DIAGNOSIS — Z79899 Other long term (current) drug therapy: Secondary | ICD-10-CM | POA: Insufficient documentation

## 2020-12-08 DIAGNOSIS — Z01812 Encounter for preprocedural laboratory examination: Secondary | ICD-10-CM | POA: Insufficient documentation

## 2020-12-08 LAB — BASIC METABOLIC PANEL
Anion gap: 5 (ref 5–15)
BUN: 21 mg/dL (ref 8–23)
CO2: 31 mmol/L (ref 22–32)
Calcium: 9.1 mg/dL (ref 8.9–10.3)
Chloride: 104 mmol/L (ref 98–111)
Creatinine, Ser: 1.13 mg/dL — ABNORMAL HIGH (ref 0.44–1.00)
GFR, Estimated: 55 mL/min — ABNORMAL LOW (ref >60)
Glucose, Bld: 143 mg/dL — ABNORMAL HIGH (ref 70–99)
Potassium: 4.2 mmol/L (ref 3.5–5.1)
Sodium: 140 mmol/L (ref 135–145)

## 2020-12-08 LAB — CBC
HCT: 43.7 % (ref 36.0–46.0)
Hemoglobin: 13.5 g/dL (ref 12.0–15.0)
MCH: 29 pg (ref 26.0–34.0)
MCHC: 30.9 g/dL (ref 30.0–36.0)
MCV: 93.8 fL (ref 80.0–100.0)
Platelets: 140 10*3/uL — ABNORMAL LOW (ref 150–400)
RBC: 4.66 MIL/uL (ref 3.87–5.11)
RDW: 15.6 % — ABNORMAL HIGH (ref 11.5–15.5)
WBC: 3.9 10*3/uL — ABNORMAL LOW (ref 4.0–10.5)
nRBC: 0 % (ref 0.0–0.2)

## 2020-12-08 NOTE — Patient Instructions (Signed)
DUE TO COVID-19 ONLY ONE VISITOR IS ALLOWED TO COME WITH YOU AND STAY IN THE WAITING ROOM ONLY DURING PRE OP AND PROCEDURE.   **NO VISITORS ARE ALLOWED IN THE SHORT STAY AREA OR RECOVERY ROOM!!**       Your procedure is scheduled on: 12/17/20   Report to [facility] Main Entrance    Report to admitting at 8:45 AM   Call this number if you have problems the morning of surgery [phone]   Do not eat food :After Midnight.   May have liquids until 8:00 am day of surgery  CLEAR LIQUID DIET  Foods Allowed                                                                     Foods Excluded  Water, Black Coffee and tea (no milk or creamer)          liquids that you cannot  Plain Jell-O in any flavor  (No red)                                   see through such as: Fruit ices (not with fruit pulp)                                           milk, soups, orange juice              Iced Popsicles (No red)                                               All solid food                                   Apple juices Sports drinks like Gatorade (No red) Lightly seasoned clear broth or consume(fat free) Sugar    Oral Hygiene is also important to reduce your risk of infection.                                    Remember - BRUSH YOUR TEETH THE MORNING OF SURGERY WITH YOUR REGULAR TOOTHPASTE   Take these medicines the morning of surgery with A SIP OF WATER: Pristiq                              You may not have any metal on your body including hair pins, jewelry, and body piercing             Do not wear make-up, lotions, powders, perfumes, or deodorant  Do not wear nail polish including gel and S&S, artificial/acrylic nails, or any other type of covering on natural nails including finger and toenails. If you have artificial nails, gel coating, etc. that needs to be removed by a nail  salon please have this removed prior to surgery or surgery may need to be canceled/ delayed if the surgeon/  anesthesia feels like they are unable to be safely monitored.   Do not shave  48 hours prior to surgery.    Do not bring valuables to the hospital. [facility] IS NOT             RESPONSIBLE   FOR VALUABLES.   Contacts, dentures or bridgework may not be worn into surgery.   Patients discharged on the day of surgery will not be allowed to drive home.   Special Instructions: Bring a copy of your healthcare power of attorney and living will documents         the day of surgery if you haven't scanned them before.              Please read over the following fact sheets you were given: IF YOU HAVE QUESTIONS ABOUT YOUR PRE-OP INSTRUCTIONS PLEASE CALL [phone]- [facility] - Preparing for Surgery Before surgery, you can play an important role.  Because skin is not sterile, your skin needs to be as free of germs as possible.  You can reduce the number of germs on your skin by washing with CHG (chlorahexidine gluconate) soap before surgery.  CHG is an antiseptic cleaner which kills germs and bonds with the skin to continue killing germs even after washing. Please DO NOT use if you have an allergy to CHG or antibacterial soaps.  If your skin becomes reddened/irritated stop using the CHG and inform your nurse when you arrive at Short Stay. Do not shave (including legs and underarms) for at least 48 hours prior to the first CHG shower.  You may shave your face/neck.  Please follow these instructions carefully:  1.  Shower with CHG Soap the night before surgery and the  morning of surgery.  2.  If you choose to wash your hair, wash your hair first as usual with your normal  shampoo.  3.  After you shampoo, rinse your hair and body thoroughly to remove the shampoo.                             4.  Use CHG as you would any other liquid soap.  You can apply chg directly to the skin and wash.  Gently with a scrungie or clean washcloth.  5.  Apply the CHG Soap to your body ONLY FROM THE NECK DOWN.    Do   not use on face/ open                           Wound or open sores. Avoid contact with eyes, ears mouth and   genitals (private parts).                       Wash face,  Genitals (private parts) with your normal soap.             6.  Wash thoroughly, paying special attention to the area where your    surgery  will be performed.  7.  Thoroughly rinse your body with warm water from the neck down.  8.  DO NOT shower/wash with your normal soap after using and rinsing off the CHG Soap.                9.  Pat yourself dry with a clean towel.            10.  Wear clean pajamas.            11.  Place clean sheets on your bed the night of your first shower and do not  sleep with pets. Day of Surgery : Do not apply any lotions/deodorants the morning of surgery.  Please wear clean clothes to the hospital/surgery center.  FAILURE TO FOLLOW THESE INSTRUCTIONS MAY RESULT IN THE CANCELLATION OF YOUR SURGERY  PATIENT SIGNATURE_________________________________  NURSE SIGNATURE__________________________________  ________________________________________________________________________

## 2020-12-09 NOTE — Anesthesia Preprocedure Evaluation (Addendum)
Anesthesia Evaluation  Patient identified by MRN, date of birth, ID band Patient awake    Reviewed: Allergy & Precautions, NPO status , Patient's Chart, lab work & pertinent test results  Airway Mallampati: III  TM Distance: <3 FB Neck ROM: Full    Dental no notable dental hx.    Pulmonary shortness of breath and with exertion, sleep apnea , former smoker,    Pulmonary exam normal breath sounds clear to auscultation + decreased breath sounds      Cardiovascular hypertension, Pt. on medications Normal cardiovascular exam Rhythm:Regular Rate:Normal  1. Difficult study due to poor acoustic windows. Left ventricular  ejection fraction, by estimation, is 60 to 65%. The left ventricle has  normal function. The left ventricle has no regional wall motion  abnormalities. There is moderate concentric left  ventricular hypertrophy. Left ventricular diastolic parameters are  consistent with Grade I diastolic dysfunction (impaired relaxation).  2. Right ventricular systolic function is normal. The right ventricular  size is normal. Tricuspid regurgitation signal is inadequate for assessing  PA pressure.  3. The mitral valve is normal in structure. No evidence of mitral valve  regurgitation. No evidence of mitral stenosis.  4. The aortic valve was not well visualized. Aortic valve regurgitation  is not visualized. No aortic stenosis is present.  5. The inferior vena cava is normal in size with greater than 50%  respiratory variability, suggesting right atrial pressure of 3 mmHg   Neuro/Psych negative neurological ROS  negative psych ROS   GI/Hepatic negative GI ROS, Neg liver ROS,   Endo/Other  negative endocrine ROS  Renal/GU negative Renal ROS  negative genitourinary   Musculoskeletal negative musculoskeletal ROS (+)   Abdominal (+) + obese,   Peds negative pediatric ROS (+)  Hematology negative hematology ROS (+)    Anesthesia Other Findings   Reproductive/Obstetrics negative OB ROS                            Anesthesia Physical Anesthesia Plan  ASA: 3  Anesthesia Plan: General   Post-op Pain Management:    Induction: Intravenous  PONV Risk Score and Plan: 3 and Ondansetron, Dexamethasone, Midazolam and Treatment may vary due to age or medical condition  Airway Management Planned: Oral ETT and Video Laryngoscope Planned  Additional Equipment:   Intra-op Plan:   Post-operative Plan: Extubation in OR  Informed Consent: I have reviewed the patients History and Physical, chart, labs and discussed the procedure including the risks, benefits and alternatives for the proposed anesthesia with the patient or authorized representative who has indicated his/her understanding and acceptance.     Dental advisory given  Plan Discussed with: CRNA and Surgeon  Anesthesia Plan Comments: (See PAT note 12/08/2020, Jodell Cipro Ward, PA-C)       Anesthesia Quick Evaluation

## 2020-12-09 NOTE — Progress Notes (Signed)
Anesthesia Chart Review   Case: [id number] Date/Time: 12/17/20 1045   Procedure: LAPAROSCOPIC CHOLECYSTECTOMY   Anesthesia type: General   Pre-op diagnosis: CHOLECYSTITIS   Location: [facility] ROOM 02 / [facility]S   Surgeons: Quentin Ore, MD       DISCUSSION:60 y.o. former smoker with h/o HTN, sleep apnea, cholecystitis scheduled for above procedure 12/17/2020 with Dr. Ivar Drape.   Elevated BP at PAT visit.  She did not take her BP medications this morning.  Advised to monitor at home and call PCP.  Discussed risk of cancellation DOS.   Per cardiology preoperative evaluation 09/07/2020, "Chart reviewed as part of pre-operative protocol coverage. Given past medical history and time since last visit, based on ACC/AHA guidelines, Savannah Roy would be at acceptable risk for the planned procedure without further cardiovascular testing."  Anticipate pt can proceed with planned procedure barring acute status change.   VS: BP (!) 191/94   Pulse 90   Temp 37.5 C (Oral)   Resp 18   Ht 5\' 6"  (1.676 m)   Wt (!) 164.5 kg   LMP  (LMP Unknown) Comment: LMP 2009  SpO2 94%   BMI 58.54 kg/m   PROVIDERS: , PA is PCP   Jarold Motto, MD is Cardiologist  LABS: Labs reviewed: Acceptable for surgery. (all labs ordered are listed, but only abnormal results are displayed)  Labs Reviewed  BASIC METABOLIC PANEL - Abnormal; Notable for the following components:      Result Value   Glucose, Bld 143 (*)    Creatinine, Ser 1.13 (*)    GFR, Estimated 55 (*)    All other components within normal limits  CBC - Abnormal; Notable for the following components:   WBC 3.9 (*)    RDW 15.6 (*)    Platelets 140 (*)    All other components within normal limits     IMAGES:   EKG: 04/13/20 Rate 86 bpm  Sinus  Rhythm  -Prominent R(V1) -nonspecific.   -Old anterior infarct.   CV: Echo 08/12/2020 1. Difficult study due to poor acoustic windows. Left ventricular  ejection  fraction, by estimation, is 60 to 65%. The left ventricle has  normal function. The left ventricle has no regional wall motion  abnormalities. There is moderate concentric left  ventricular hypertrophy. Left ventricular diastolic parameters are  consistent with Grade I diastolic dysfunction (impaired relaxation).   2. Right ventricular systolic function is normal. The right ventricular  size is normal. Tricuspid regurgitation signal is inadequate for assessing  PA pressure.   3. The mitral valve is normal in structure. No evidence of mitral valve  regurgitation. No evidence of mitral stenosis.   4. The aortic valve was not well visualized. Aortic valve regurgitation  is not visualized. No aortic stenosis is present.   5. The inferior vena cava is normal in size with greater than 50%  respiratory variability, suggesting right atrial pressure of 3 mmHg.   Stress Test 04/19/2016 The left ventricular ejection fraction is mildly decreased (45-54%). Nuclear stress EF: 49%. There was no ST segment deviation noted during stress. This is a low risk study.   Normal resting and stress perfusion. No ischemia or infarction EF EF estimated at 49% with significant appearing LVH suggest Echo correlation    Past Medical History:  Diagnosis Date   Anxiety    Depression    Hypertension    LVH (left ventricular hypertrophy) due to hypertensive disease 05/10/2016   Shortness of breath 04/07/2016  Sleep apnea     Past Surgical History:  Procedure Laterality Date   CYSTECTOMY Right    gum cyst removal   ENDOMETRIAL ABLATION W/ NOVASURE  2007   HERNIA REPAIR  1985   KNEE ARTHROSCOPY Right 2005    MEDICATIONS:  amLODipine-atorvastatin (CADUET) 10-40 MG tablet   desvenlafaxine (PRISTIQ) 100 MG 24 hr tablet   traZODone (DESYREL) 50 MG tablet   No current facility-administered medications for this encounter.    Jodell Cipro Ward, PA-C [facility] Pre-Surgical Testing [phone]

## 2020-12-17 ENCOUNTER — Encounter (HOSPITAL_COMMUNITY)
Admission: RE | Disposition: A | Discharge status: Home / Self Care | Payer: Self-pay | Source: Home / Self Care | Attending: Surgery

## 2020-12-17 ENCOUNTER — Ambulatory Visit (HOSPITAL_COMMUNITY): Payer: Self-pay | Admitting: Physician Assistant

## 2020-12-17 ENCOUNTER — Telehealth: Payer: Self-pay | Admitting: Licensed Clinical Social Worker

## 2020-12-17 ENCOUNTER — Encounter (HOSPITAL_COMMUNITY): Payer: Self-pay | Admitting: Surgery

## 2020-12-17 ENCOUNTER — Ambulatory Visit (HOSPITAL_COMMUNITY)
Admission: RE | Admit: 2020-12-17 | Discharge: 2020-12-17 | Disposition: A | Discharge status: Home / Self Care | Payer: Self-pay | Attending: Surgery | Admitting: Surgery

## 2020-12-17 ENCOUNTER — Ambulatory Visit (HOSPITAL_COMMUNITY): Payer: Self-pay | Admitting: Certified Registered"

## 2020-12-17 DIAGNOSIS — Z79899 Other long term (current) drug therapy: Secondary | ICD-10-CM | POA: Insufficient documentation

## 2020-12-17 DIAGNOSIS — Z881 Allergy status to other antibiotic agents status: Secondary | ICD-10-CM | POA: Insufficient documentation

## 2020-12-17 DIAGNOSIS — I10 Essential (primary) hypertension: Secondary | ICD-10-CM | POA: Insufficient documentation

## 2020-12-17 DIAGNOSIS — K8010 Calculus of gallbladder with chronic cholecystitis without obstruction: Secondary | ICD-10-CM | POA: Insufficient documentation

## 2020-12-17 DIAGNOSIS — Z87891 Personal history of nicotine dependence: Secondary | ICD-10-CM | POA: Insufficient documentation

## 2020-12-17 DIAGNOSIS — Z8249 Family history of ischemic heart disease and other diseases of the circulatory system: Secondary | ICD-10-CM | POA: Insufficient documentation

## 2020-12-17 DIAGNOSIS — G4730 Sleep apnea, unspecified: Secondary | ICD-10-CM | POA: Insufficient documentation

## 2020-12-17 HISTORY — PX: CHOLECYSTECTOMY: SHX55

## 2020-12-17 SURGERY — LAPAROSCOPIC CHOLECYSTECTOMY
Anesthesia: General | Site: Abdomen

## 2020-12-17 MED ORDER — PHENYLEPHRINE HCL (PRESSORS) 10 MG/ML IV SOLN
INTRAVENOUS | Status: AC
  Filled 2020-12-17: qty 2

## 2020-12-17 MED ORDER — BUPIVACAINE-EPINEPHRINE (PF) 0.25% -1:200000 IJ SOLN
INTRAMUSCULAR | Status: AC
  Filled 2020-12-17: qty 30

## 2020-12-17 MED ORDER — MIDAZOLAM HCL 2 MG/2ML IJ SOLN
INTRAMUSCULAR | Status: AC
  Filled 2020-12-17: qty 2

## 2020-12-17 MED ORDER — ORAL CARE MOUTH RINSE: 15.0000 mL | Freq: Once | OROMUCOSAL | Status: AC

## 2020-12-17 MED ORDER — ONDANSETRON HCL 4 MG/2ML IJ SOLN: 4.0000 mg | Freq: Once | INTRAMUSCULAR | Status: DC | PRN

## 2020-12-17 MED ORDER — PHENYLEPHRINE 40 MCG/ML (10ML) SYRINGE FOR IV PUSH (FOR BLOOD PRESSURE SUPPORT)
PREFILLED_SYRINGE | INTRAVENOUS | Status: DC | PRN
  Administered 2020-12-17 (×2): 120 ug via INTRAVENOUS

## 2020-12-17 MED ORDER — KETOROLAC TROMETHAMINE 30 MG/ML IJ SOLN
30.0000 mg | Freq: Once | INTRAMUSCULAR | Status: AC | PRN
  Administered 2020-12-17: 30 mg via INTRAVENOUS

## 2020-12-17 MED ORDER — FENTANYL CITRATE (PF) 100 MCG/2ML IJ SOLN
INTRAMUSCULAR | Status: AC
  Filled 2020-12-17: qty 2

## 2020-12-17 MED ORDER — FENTANYL CITRATE (PF) 100 MCG/2ML IJ SOLN
INTRAMUSCULAR | Status: DC | PRN
  Administered 2020-12-17: 100 ug via INTRAVENOUS

## 2020-12-17 MED ORDER — BUPIVACAINE-EPINEPHRINE 0.25% -1:200000 IJ SOLN
INTRAMUSCULAR | Status: DC | PRN
  Administered 2020-12-17: 30 mL

## 2020-12-17 MED ORDER — ONDANSETRON HCL 4 MG/2ML IJ SOLN
INTRAMUSCULAR | Status: DC | PRN
  Administered 2020-12-17: 4 mg via INTRAVENOUS

## 2020-12-17 MED ORDER — ROCURONIUM BROMIDE 10 MG/ML (PF) SYRINGE
PREFILLED_SYRINGE | INTRAVENOUS | Status: DC | PRN
  Administered 2020-12-17: 50 mg via INTRAVENOUS

## 2020-12-17 MED ORDER — BUPIVACAINE LIPOSOME 1.3 % IJ SUSP: 20.0000 mL | Freq: Once | INTRAMUSCULAR | Status: DC

## 2020-12-17 MED ORDER — CHLORHEXIDINE GLUCONATE CLOTH 2 % EX PADS: 6.0000 | MEDICATED_PAD | Freq: Once | CUTANEOUS | Status: DC

## 2020-12-17 MED ORDER — OXYCODONE-ACETAMINOPHEN 5-325 MG PO TABS: 1.0000 | ORAL_TABLET | ORAL | 0 refills | Status: DC | PRN

## 2020-12-17 MED ORDER — DEXAMETHASONE SODIUM PHOSPHATE 10 MG/ML IJ SOLN
INTRAMUSCULAR | Status: DC | PRN
  Administered 2020-12-17: 4 mg via INTRAVENOUS

## 2020-12-17 MED ORDER — ROCURONIUM BROMIDE 10 MG/ML (PF) SYRINGE
PREFILLED_SYRINGE | INTRAVENOUS | Status: AC
  Filled 2020-12-17: qty 10

## 2020-12-17 MED ORDER — OXYCODONE HCL 5 MG/5ML PO SOLN: 5.0000 mg | Freq: Once | ORAL | Status: DC | PRN

## 2020-12-17 MED ORDER — CHLORHEXIDINE GLUCONATE 0.12 % MT SOLN
15.0000 mL | Freq: Once | OROMUCOSAL | Status: AC
  Administered 2020-12-17: 15 mL via OROMUCOSAL

## 2020-12-17 MED ORDER — CELECOXIB 200 MG PO CAPS
400.0000 mg | ORAL_CAPSULE | ORAL | Status: AC
  Administered 2020-12-17: 400 mg via ORAL
  Filled 2020-12-17: qty 2

## 2020-12-17 MED ORDER — BUPIVACAINE-EPINEPHRINE (PF) 0.5% -1:200000 IJ SOLN
INTRAMUSCULAR | Status: AC
  Filled 2020-12-17: qty 30

## 2020-12-17 MED ORDER — FENTANYL CITRATE PF 50 MCG/ML IJ SOSY
PREFILLED_SYRINGE | INTRAMUSCULAR | Status: AC
  Filled 2020-12-17: qty 1

## 2020-12-17 MED ORDER — SUCCINYLCHOLINE CHLORIDE 200 MG/10ML IV SOSY
PREFILLED_SYRINGE | INTRAVENOUS | Status: AC
  Filled 2020-12-17: qty 10

## 2020-12-17 MED ORDER — OXYCODONE HCL 5 MG PO TABS: 5.0000 mg | ORAL_TABLET | Freq: Once | ORAL | Status: DC | PRN

## 2020-12-17 MED ORDER — PHENYLEPHRINE 40 MCG/ML (10ML) SYRINGE FOR IV PUSH (FOR BLOOD PRESSURE SUPPORT)
PREFILLED_SYRINGE | INTRAVENOUS | Status: AC
  Filled 2020-12-17: qty 10

## 2020-12-17 MED ORDER — ONDANSETRON HCL 4 MG/2ML IJ SOLN
INTRAMUSCULAR | Status: AC
  Filled 2020-12-17: qty 2

## 2020-12-17 MED ORDER — LACTATED RINGERS IV SOLN: INTRAVENOUS | Status: DC | PRN

## 2020-12-17 MED ORDER — MIDAZOLAM HCL 5 MG/5ML IJ SOLN
INTRAMUSCULAR | Status: DC | PRN
Start: 2020-12-17 — End: 2020-12-17
  Administered 2020-12-17: 2 mg via INTRAVENOUS

## 2020-12-17 MED ORDER — LACTATED RINGERS IV SOLN: INTRAVENOUS | Status: DC

## 2020-12-17 MED ORDER — SUCCINYLCHOLINE CHLORIDE 200 MG/10ML IV SOSY
PREFILLED_SYRINGE | INTRAVENOUS | Status: DC | PRN
  Administered 2020-12-17: 200 mg via INTRAVENOUS

## 2020-12-17 MED ORDER — SUGAMMADEX SODIUM 500 MG/5ML IV SOLN
INTRAVENOUS | Status: AC
  Filled 2020-12-17: qty 5

## 2020-12-17 MED ORDER — SUGAMMADEX SODIUM 500 MG/5ML IV SOLN
INTRAVENOUS | Status: DC | PRN
  Administered 2020-12-17: 340 mg via INTRAVENOUS

## 2020-12-17 MED ORDER — PROPOFOL 10 MG/ML IV BOLUS
INTRAVENOUS | Status: AC
  Filled 2020-12-17: qty 40

## 2020-12-17 MED ORDER — FENTANYL CITRATE PF 50 MCG/ML IJ SOSY
25.0000 ug | PREFILLED_SYRINGE | INTRAMUSCULAR | Status: DC | PRN
  Administered 2020-12-17: 50 ug via INTRAVENOUS

## 2020-12-17 MED ORDER — LACTATED RINGERS IR SOLN
Status: DC | PRN
  Administered 2020-12-17: 1000 mL

## 2020-12-17 MED ORDER — GABAPENTIN 300 MG PO CAPS
300.0000 mg | ORAL_CAPSULE | ORAL | Status: AC
  Administered 2020-12-17: 300 mg via ORAL
  Filled 2020-12-17: qty 1

## 2020-12-17 MED ORDER — KETOROLAC TROMETHAMINE 30 MG/ML IJ SOLN
INTRAMUSCULAR | Status: AC
  Filled 2020-12-17: qty 1

## 2020-12-17 MED ORDER — LIDOCAINE HCL (PF) 2 % IJ SOLN
INTRAMUSCULAR | Status: DC | PRN
  Administered 2020-12-17: 100 mg via INTRADERMAL

## 2020-12-17 MED ORDER — PHENYLEPHRINE HCL-NACL 20-0.9 MG/250ML-% IV SOLN
INTRAVENOUS | Status: DC | PRN
  Administered 2020-12-17: 30 ug/min via INTRAVENOUS

## 2020-12-17 MED ORDER — CEFAZOLIN SODIUM-DEXTROSE 2-3 GM-%(50ML) IV SOLR: INTRAVENOUS | Status: DC | PRN

## 2020-12-17 MED ORDER — DEXAMETHASONE SODIUM PHOSPHATE 10 MG/ML IJ SOLN
INTRAMUSCULAR | Status: AC
  Filled 2020-12-17: qty 1

## 2020-12-17 MED ORDER — PROPOFOL 10 MG/ML IV BOLUS
INTRAVENOUS | Status: DC | PRN
  Administered 2020-12-17: 200 mg via INTRAVENOUS

## 2020-12-17 MED ORDER — CEFAZOLIN IN SODIUM CHLORIDE 3-0.9 GM/100ML-% IV SOLN
3.0000 g | INTRAVENOUS | Status: AC
  Administered 2020-12-17: 3 g via INTRAVENOUS
  Filled 2020-12-17: qty 100

## 2020-12-17 MED ORDER — LIDOCAINE HCL (PF) 2 % IJ SOLN
INTRAMUSCULAR | Status: AC
  Filled 2020-12-17: qty 5

## 2020-12-17 MED ORDER — ACETAMINOPHEN 500 MG PO TABS
1000.0000 mg | ORAL_TABLET | ORAL | Status: AC
  Administered 2020-12-17: 1000 mg via ORAL
  Filled 2020-12-17: qty 2

## 2020-12-17 SURGICAL SUPPLY — 47 items
ADH SKN CLS APL DERMABOND .7 (GAUZE/BANDAGES/DRESSINGS) ×1
APL PRP STRL LF DISP 70% ISPRP (MISCELLANEOUS) ×2
APPLIER CLIP ROT 10 11.4 M/L (STAPLE) ×2
APR CLP MED LRG 11.4X10 (STAPLE) ×1
BAG COUNTER SPONGE SURGICOUNT (BAG) IMPLANT
BAG SPEC RTRVL 10 TROC 200 (ENDOMECHANICALS) ×1
BAG SPNG CNTER NS LX DISP (BAG)
CABLE HIGH FREQUENCY MONO STRZ (ELECTRODE) ×2 IMPLANT
CATH URETL OPEN 5X70 (CATHETERS) IMPLANT
CHLORAPREP W/TINT 26 (MISCELLANEOUS) ×3 IMPLANT
CLIP APPLIE ROT 10 11.4 M/L (STAPLE) ×1 IMPLANT
COVER MAYO STAND STRL (DRAPES) IMPLANT
COVER SURGICAL LIGHT HANDLE (MISCELLANEOUS) ×2 IMPLANT
DECANTER SPIKE VIAL GLASS SM (MISCELLANEOUS) ×2 IMPLANT
DERMABOND ADVANCED (GAUZE/BANDAGES/DRESSINGS) ×1
DERMABOND ADVANCED .7 DNX12 (GAUZE/BANDAGES/DRESSINGS) ×1 IMPLANT
DRAPE C-ARM 42X120 X-RAY (DRAPES) IMPLANT
ELECT REM PT RETURN 15FT ADLT (MISCELLANEOUS) ×2 IMPLANT
ENDOLOOP SUT PDS II  0 18 (SUTURE) ×2
ENDOLOOP SUT PDS II 0 18 (SUTURE) ×1 IMPLANT
GLOVE SRG 8 PF TXTR STRL LF DI (GLOVE) ×1 IMPLANT
GLOVE SURG ENC MOIS LTX SZ7.5 (GLOVE) ×2 IMPLANT
GLOVE SURG UNDER POLY LF SZ8 (GLOVE) ×2
GOWN STRL REUS W/TWL LRG LVL3 (GOWN DISPOSABLE) ×2 IMPLANT
GOWN STRL REUS W/TWL XL LVL3 (GOWN DISPOSABLE) ×4 IMPLANT
GRASPER SUT TROCAR 14GX15 (MISCELLANEOUS) IMPLANT
HEMOSTAT SNOW SURGICEL 2X4 (HEMOSTASIS) IMPLANT
IRRIG SUCT STRYKERFLOW 2 WTIP (MISCELLANEOUS) ×2
IRRIGATION SUCT STRKRFLW 2 WTP (MISCELLANEOUS) ×1 IMPLANT
IV CATH 14GX2 1/4 (CATHETERS) ×1 IMPLANT
KIT BASIN OR (CUSTOM PROCEDURE TRAY) ×2 IMPLANT
KIT TURNOVER KIT A (KITS) ×1 IMPLANT
NDL INSUFFLATION 14GA 120MM (NEEDLE) ×1 IMPLANT
NEEDLE INSUFFLATION 14GA 120MM (NEEDLE) ×2 IMPLANT
PENCIL SMOKE EVACUATOR (MISCELLANEOUS) IMPLANT
POUCH RETRIEVAL ECOSAC 10 (ENDOMECHANICALS) ×1 IMPLANT
POUCH RETRIEVAL ECOSAC 10MM (ENDOMECHANICALS) ×2
SCISSORS LAP 5X35 DISP (ENDOMECHANICALS) ×2 IMPLANT
SET TUBE SMOKE EVAC HIGH FLOW (TUBING) ×2 IMPLANT
SLEEVE XCEL OPT CAN 5 100 (ENDOMECHANICALS) ×4 IMPLANT
STOPCOCK 4 WAY LG BORE MALE ST (IV SETS) IMPLANT
SUT MNCRL AB 4-0 PS2 18 (SUTURE) ×2 IMPLANT
TOWEL OR 17X26 10 PK STRL BLUE (TOWEL DISPOSABLE) ×2 IMPLANT
TOWEL OR NON WOVEN STRL DISP B (DISPOSABLE) IMPLANT
TRAY LAPAROSCOPIC (CUSTOM PROCEDURE TRAY) ×2 IMPLANT
TROCAR BLADELESS OPT 5 100 (ENDOMECHANICALS) ×2 IMPLANT
TROCAR XCEL 12X100 BLDLESS (ENDOMECHANICALS) ×2 IMPLANT

## 2020-12-17 NOTE — Anesthesia Postprocedure Evaluation (Signed)
Anesthesia Post Note  Patient: Savannah Roy  Procedure(s) Performed: LAPAROSCOPIC CHOLECYSTECTOMY (Abdomen)     Patient location during evaluation: PACU Anesthesia Type: General Level of consciousness: awake and alert Pain management: pain level controlled Vital Signs Assessment: post-procedure vital signs reviewed and stable Respiratory status: spontaneous breathing, nonlabored ventilation, respiratory function stable and patient connected to nasal cannula oxygen Cardiovascular status: blood pressure returned to baseline and stable Postop Assessment: no apparent nausea or vomiting Anesthetic complications: no   No notable events documented.  Last Vitals:  Vitals:   12/17/20 1245 12/17/20 1300  BP: (!) 113/57 (!) 149/68  Pulse: 73 77  Resp: 19 20  Temp:  36.7 C  SpO2: 96% 94%    Last Pain:  Vitals:   12/17/20 1245  TempSrc:   PainSc: 7                  Lottie Siska S

## 2020-12-17 NOTE — Op Note (Signed)
Patient: Savannah Roy (09/27/1960, [id number])  Date of Surgery: 12/17/2020   Preoperative Diagnosis: CHOLECYSTITIS   Postoperative Diagnosis: CHOLECYSTITIS   Surgical Procedure: LAPAROSCOPIC CHOLECYSTECTOMY:    Operative Team Members:  Surgeon(s) and Role:    * Markita Stcharles, Hyman Hopes, MD - Primary   Anesthesiologist: Eilene Ghazi, MD CRNA: Ezekiel Ina, CRNA; Nelle Don, CRNA   Anesthesia: General   Fluids:  Total I/O In: 100 [IV Piggyback:100] Out: -   Complications: None  Drains:  none   Specimen:  ID Type Source Tests Collected by Time Destination  1 : gallbladder Tissue PATH Gallbladder SURGICAL PATHOLOGY Savannah Roy, Hyman Hopes, MD 12/17/2020 1155      Disposition:  PACU - hemodynamically stable.  Plan of Care: Discharge to home after PACU    Indications for Procedure: Savannah Roy is a 60 y.o. female who presented with abdominal pain.  History, physical and imaging was concerning for cholecystitis.  Laparoscopic cholecystectomy was recommended for the patient.  The procedure itself, as well as the risks, benefits and alternatives were discussed with the patient.  Risks discussed included but were not limited to the risk of infection, bleeding, damage to nearby structures, need to convert to open procedure, incisional hernia, bile leak, common bile duct injury and the need for additional procedures or surgeries.  With this discussion complete and all questions answered the patient granted consent to proceed.  Findings: Cirrhotic appearance of the liver, inflamed gallbladder  Infection status: Patient: Private Patient Elective Case Case: Elective Infection Present At Time Of Surgery (PATOS): None   Description of Procedure:   On the date stated above, the patient was taken to the operating room suite and placed in supine positioning.  Sequential compression devices were placed on the lower extremities to prevent blood clots.  General  endotracheal anesthesia was induced. Preoperative antibiotics (cefazolin) were given within 30 minutes of incision.  The patient's abdomen was prepped and draped in the usual sterile fashion.  A time-out was completed verifying the correct patient, procedure, positioning and equipment needed for the case.  We began by anesthetizing the skin with local anesthetic and then making a 5 mm incision just below the umbilicus.  We dissected through the subcutaneous tissues to the fascia.  The fascia was grasped and elevated using a Kocher clamp.  A Veress needle was inserted into the abdomen and the abdomen was insufflated to 15 mmHg.  A 5 mm trocar was inserted in this position under optical guidance and then the abdomen was inspected.  There was no trauma to the underlying viscera with initial trocar placement.  Any abnormal findings, other than inflammation in the right upper quadrant, are listed above in the findings section.  Three additional trocars were placed, one 12 mm trocar in the subxiphoid position, one 5 mm trocar in the midline epigastric area and one 49mm trocar in the right upper quadrant subcostally.  These were placed under direct vision without any trauma to the underlying viscera.    The patient was then placed in head up, left side down positioning.  The gallbladder was identified and dissected free from its attachments to the omentum allowing the duodenum to fall away.  The infundibulum of the gallbladder was dissected free working laterally to medially.  The cystic duct and cystic artery were dissected free from surrounding connective tissue.  The infundibulum of the gallbladder was dissected off the cystic plate.  A critical view of safety was obtained with the cystic duct and  cystic artery being cleared of connective tissues and clearly the only two structures entering into the gallbladder with the liver clearly visible behind.  Clips were then applied to the cystic duct and cystic artery and  then these structures were divided.  The gallbladder was dissected off the cystic plate, placed in an endocatch bag and removed from the 12 mm subxiphoid port site.  The clips were inspected and appeared effective.  The cystic plate was inspected and hemostasis was obtained using electrocautery.  The operative field was dry on final inspection.  Attention was turned to closure.  The 12 mm subxiphoid port site was closed using a 0-vicryl suture on a fascial suture passer.  The abdomen was desufflated.  The skin was closed using 4-0 monocryl and dermabond.  All sponge and needle counts were correct at the conclusion of the case.    Louanna Raw, MD General, Bariatric, & Minimally Invasive Surgery [facility], [place]

## 2020-12-17 NOTE — Transfer of Care (Signed)
Immediate Anesthesia Transfer of Care Note  Patient: Savannah Roy  Procedure(s) Performed: LAPAROSCOPIC CHOLECYSTECTOMY (Abdomen)  Patient Location: PACU  Anesthesia Type:General  Level of Consciousness: awake, alert  and oriented  Airway & Oxygen Therapy: Patient Spontanous Breathing and Patient connected to face mask oxygen  Post-op Assessment: Report given to RN, Post -op Vital signs reviewed and stable and Patient moving all extremities X 4  Post vital signs: Reviewed and stable  Last Vitals:  Vitals Value Taken Time  BP 135/75 12/17/20 1207  Temp    Pulse 78 12/17/20 1209  Resp 20 12/17/20 1209  SpO2 98 % 12/17/20 1209  Vitals shown include unvalidated device data.  Last Pain:  Vitals:   12/17/20 0931  TempSrc:   PainSc: 0-No pain         Complications: No notable events documented.

## 2020-12-17 NOTE — Telephone Encounter (Signed)
LCSW received a text message ([phone]) from pt stating that she has been staying with her stepmother, still providing care, and is hoping to still get the paperwork completed. I shared that was okay and I remain available to assist as needed w/ any questions. She states her father plans to remain in [place] and she is looking to move her stepmother into a facility. LCSW inquired if she had started this process and if she at least knew where her father was in [place]. No answer, I remain available.   Octavio Graves, MSW, LCSW [facility] Navigation  [phone]

## 2020-12-17 NOTE — Discharge Instructions (Signed)
 CHOLECYSTECTOMY POST OPERATIVE INSTRUCTIONS  Thinking Clearly  The anesthesia may cause you to feel different for 1 or 2 days. Do not drive, drink alcohol, or make any big decisions for at least 2 days.  Nutrition When you wake up, you will be able to drink small amounts of liquid. If you do not feel sick, you can slowly advance your diet to regular foods. Continue to drink lots of fluids, usually about 8 to 10 glasses per day. Eat a high-fiber diet so you don't strain during bowel movements. High-Fiber Foods Foods high in fiber include beans, bran cereals and whole-grain breads, peas, dried fruit (figs, apricots, and dates), raspberries, blackberries, strawberries, sweet corn, broccoli, baked potatoes with skin, plums, pears, apples, greens, and nuts. Activity Slowly increase your activity. Be sure to get up and walk every hour or so to prevent blood clots. No heavy lifting or strenuous activity for 4 weeks following surgery to prevent hernias at your incision sites It is normal to feel tired. You may need more sleep than usual.  Get your rest but make sure to get up and move around frequently to prevent blood clots and pneumonia.  Work and Return to School You can go back to work when you feel well enough. Discuss the timing with your surgeon. You can usually go back to school or work 1 week after an operation. If your work requires heavy lifting or strenuous activity you need to be placed on light duty for 4 weeks following surgery. You can return to gym class, sports or other physical activities 4 weeks after surgery.  Wound Care Always wash your hands before and after touching near your incision site. Do not soak in a bathtub until cleared at your follow up appointment. You may take a shower 24 hours after surgery. A small amount of drainage from the incision is normal. If the drainage is thick and yellow or the site is red, you may have an infection, so call your surgeon. If you  have a drain in one of your incisions, it will be taken out in office when the drainage stops. Steri-Strips will fall off in 7 to 10 days or they will be removed during your first office visit. If you have dermabond glue covering over the incision, allow the glue to flake off on its own. Avoid wearing tight or rough clothing. It may rub your incisions and make it harder for them to heal. Protect the new skin, especially from the sun. The sun can burn and cause darker scarring. Your scar will heal in about 4 to 6 weeks and will become softer and continue to fade over the next year.  The cosmetic appearance of the incisions will improve over the course of the first year after surgery. Sensation around your incision will return in a few weeks or months.  Bowel Movements After intestinal surgery, you may have loose watery stools for several days. If watery diarrhea lasts longer than 3 days, contact your surgeon. Pain medication (narcotics) can cause constipation. Increase the fiber in your diet with high-fiber foods if you are constipated. You can take an over the counter stool softener like Colace to avoid constipation.  Additional over the counter medications can also be used if Colace isn't sufficient (for example, Milk of Magnesia or Miralax).  Pain The amount of pain is different for each person. Some people need only 1 to 3 doses of pain control medication, while others need more. Take alternating doses of tylenol   and ibuprofen around the clock for the first five days following surgery.  This will provide a baseline of pain control and help with inflammation.  Take the narcotic pain medication in addition if needed for severe pain.  Contact Your Surgeon at [phone], if you have: Pain in your right upper abdomen like a gallbladder attack. Pain that will not go away Pain that gets worse A fever of more than 101F (38.3C) Repeated vomiting Swelling, redness, bleeding, or bad-smelling  drainage from your wound site Strong abdominal pain No bowel movement or unable to pass gas for 3 days Watery diarrhea lasting longer than 3 days  Pain Control The goal of pain control is to minimize pain, keep you moving and help you heal. Your surgical team will work with you on your pain plan. Most often a combination of therapies and medications are used to control your pain. You may also be given medication (local anesthetic) at the surgical site. This may help control your pain for several days. Extreme pain puts extra stress on your body at a time when your body needs to focus on healing. Do not wait until your pain has reached a level "10" or is unbearable before telling your doctor or nurse. It is much easier to control pain before it becomes severe. Following a laparoscopic procedure, pain is sometimes felt in the shoulder. This is due to the gas inserted into your abdomen during the procedure. Moving and walking helps to decrease the gas and the right shoulder pain.  Use the guide below for ways to manage your post-operative pain. Learn more by going to facs.org/safepaincontrol.  How Intense Is My Pain Common Therapies to Feel Better       I hardly notice my pain, and it does not interfere with my activities.  I notice my pain and it distracts me, but I can still do activities (sitting up, walking, standing).  Non-Medication Therapies  Ice (in a bag, applied over clothing at the surgical site), elevation, rest, meditation, massage, distraction (music, TV, play) walking and mild exercise Splinting the abdomen with pillows +  Non-Opioid Medications Acetaminophen (Tylenol) Non-steroidal anti-inflammatory drugs (NSAIDS) Aspirin, Ibuprofen (Motrin, Advil) Naproxen (Aleve) Take these as needed, when you feel pain. Both acetaminophen and NSAIDs help to decrease pain and swelling (inflammation).      My pain is hard to ignore and is more noticeable even when I rest.  My  pain interferes with my usual activities.  Non-Medication Therapies  +  Non-Opioid medications  Take on a regular schedule (around-the-clock) instead of as needed. (For example, Tylenol every 6 hours at 9:00 am, 3:00 pm, 9:00 pm, 3:00 am and Motrin every 6 hours at 12:00 am, 6:00 am, 12:00 pm, 6:00 pm)         I am focused on my pain, and I am not doing my daily activities.  I am groaning in pain, and I cannot sleep. I am unable to do anything.  My pain is as bad as it could be, and nothing else matters.  Non-Medication Therapies  +  Around-the-Clock Non-Opioid Medications  +  Short-acting opioids  Opioids should be used with other medications to manage severe pain. Opioids block pain and give a feeling of euphoria (feel high). Addiction, a serious side effect of opioids, is rare with short-term (a few days) use.  Examples of short-acting opioids include: Tramadol (Ultram), Hydrocodone (Norco, Vicodin), Hydromorphone (Dilaudid), Oxycodone (Oxycontin)     The above directions have been adapted from   the American College of Surgeons Surgical Patient Education Program.  Please refer to the ACS website if needed: https://[email or web address].org/-/media/files/education/patient-[email or web address]/cholesys.[email or web address].   Karlis Cregg, MD [facility] [address], [address], [place], [place]  [address] ?  [address] [address], [place], [place]   [address] [phone] ? [phone] ? FAX [phone] Web site: [email or web address]  

## 2020-12-17 NOTE — H&P (Signed)
Admitting Physician: Hyman Hopes Marinell Igarashi  Service: General Surgery  CC: Abdominal pain  Subjective   HPI: Savannah Roy is an 60 y.o. female who is here for elective cholecystectomy  Past Medical History:  Diagnosis Date   Anxiety    Depression    Hypertension    LVH (left ventricular hypertrophy) due to hypertensive disease 05/10/2016   Shortness of breath 04/07/2016   Sleep apnea     Past Surgical History:  Procedure Laterality Date   CYSTECTOMY Right    gum cyst removal   ENDOMETRIAL ABLATION W/ NOVASURE  2007   HERNIA REPAIR  1985   KNEE ARTHROSCOPY Right 2005    Family History  Problem Relation Age of Onset   Cancer Mother    Heart attack Mother    CVA Mother    Thyroid disease Mother    Kidney failure Father    Thyroid disease Sister    Heart failure Maternal Grandmother     Social:  reports that she quit smoking about 7 years ago. Her smoking use included cigarettes. She has never used smokeless tobacco. She reports current alcohol use. She reports that she does not use drugs.  Allergies:  Allergies  Allergen Reactions   Erythromycin     REACTION: rash    Medications: Current Outpatient Medications  Medication Instructions   amLODipine-atorvastatin (CADUET) 10-40 MG tablet 1 tablet, Oral, Daily   desvenlafaxine (PRISTIQ) 100 MG 24 hr tablet TAKE 1 TABLET (100 MG) BY MOUTH DAILY   traZODone (DESYREL) 50 mg, Oral, Daily at bedtime    ROS - all of the below systems have been reviewed with the patient and positives are indicated with bold text General: chills, fever or night sweats Eyes: blurry vision or double vision ENT: epistaxis or sore throat Allergy/Immunology: itchy/watery eyes or nasal congestion Hematologic/Lymphatic: bleeding problems, blood clots or swollen lymph nodes Endocrine: temperature intolerance or unexpected weight changes Breast: new or changing breast lumps or nipple discharge Resp: cough, shortness of breath, or  wheezing CV: chest pain or dyspnea on exertion GI: as per HPI GU: dysuria, trouble voiding, or hematuria MSK: joint pain or joint stiffness Neuro: TIA or stroke symptoms Derm: pruritus and skin lesion changes Psych: anxiety and depression  Objective   PE There were no vitals taken for this visit. Constitutional: NAD; conversant; no deformities Eyes: Moist conjunctiva; no lid lag; anicteric; PERRL Neck: Trachea midline; no thyromegaly Lungs: Normal respiratory effort; no tactile fremitus CV: RRR; no palpable thrills; no pitting edema GI: Abd soft, non-tender; no palpable hepatosplenomegaly MSK: Normal range of motion of extremities; no clubbing/cyanosis Psychiatric: Appropriate affect; alert and oriented x3 Lymphatic: No palpable cervical or axillary lymphadenopathy  No results found for this or any previous visit (from the past 24 hour(s)).  Imaging Orders  No imaging studies ordered today  RUQ Ultrasound 04/13/20: 1. The spleen appears enlarged. 2. Small gallstone without sonographic evidence for acute cholecystitis. 8 mm gallbladder polyp, suggest 1 year ultrasound follow-up. 3. Slightly echogenic liver as may be seen with steatosis.   Assessment and Plan   Savannah Roy is an 60 y.o. female with calculus of gallbladder with chronic cholecystitis without obstruction.  I recommended laparoscopic cholecystectomy. The procedure itself as well as its risk, benefits, and alternatives were discussed with the patient. The risk discussed included were not limited to the risk of infection, bleeding, damage nearby structures. After full discussion all questions answered the patient granted consent to proceed.   No diagnosis found.  Felicie Morn, MD  [facility] Use [email or web address] to contact on call provider

## 2020-12-17 NOTE — Anesthesia Procedure Notes (Signed)
Procedure Name: Intubation Date/Time: 12/17/2020 11:00 AM Performed by: Ezekiel Ina, CRNA Pre-anesthesia Checklist: Patient identified, Emergency Drugs available, Suction available and Patient being monitored Patient Re-evaluated:Patient Re-evaluated prior to induction Oxygen Delivery Method: Circle system utilized Preoxygenation: Pre-oxygenation with 100% oxygen Induction Type: IV induction Ventilation: Mask ventilation without difficulty and Oral airway inserted - appropriate to patient size Laryngoscope Size: Glidescope and 3 Grade View: Grade I Tube type: Oral Tube size: 7.0 mm Number of attempts: 1 Airway Equipment and Method: Stylet Placement Confirmation: ETT inserted through vocal cords under direct vision, positive ETCO2 and breath sounds checked- equal and bilateral Secured at: 22 cm Tube secured with: Tape Dental Injury: Teeth and Oropharynx as per pre-operative assessment  Difficulty Due To: Difficulty was anticipated and Difficult Airway-  due to edematous airway Future Recommendations: Recommend- induction with short-acting agent, and alternative techniques readily available

## 2020-12-18 ENCOUNTER — Encounter (HOSPITAL_COMMUNITY): Payer: Self-pay | Admitting: Surgery

## 2020-12-23 LAB — SURGICAL PATHOLOGY

## 2021-01-03 ENCOUNTER — Telehealth: Payer: Self-pay

## 2021-01-03 MED ORDER — TRAZODONE HCL 50 MG PO TABS: 50.0000 mg | ORAL_TABLET | Freq: Every day | ORAL | 0 refills | Status: DC

## 2021-01-03 NOTE — Telephone Encounter (Signed)
LAST APPOINTMENT DATE:  11/10/20  NEXT APPOINTMENT DATE: None  MEDICATION:traZODone (DESYREL) 50 MG tablet  PHARMACY: [facility] - [place], [place] - [address]

## 2021-02-01 ENCOUNTER — Telehealth: Payer: Self-pay | Admitting: Licensed Clinical Social Worker

## 2021-02-01 NOTE — Telephone Encounter (Signed)
Mailed pt new copies of [employer] and [employer] since both of hers have expired and previously sent applications are likely out of date. I remain available for questions/concerns.   Octavio Graves, MSW, LCSW Clinical Social Worker II [facility] Navigation  [phone]- work cell phone (preferred) [phone]- desk phone

## 2021-03-04 ENCOUNTER — Telehealth: Payer: Self-pay | Admitting: Licensed Clinical Social Worker

## 2021-03-04 NOTE — Telephone Encounter (Signed)
Sent pt text message to phone to see if she would like for me to mail her documents that she had left w/ this Clinical research associate for [employer] returned to her home address. At this time no f/u message received, will attempt x1 more.   Octavio Graves, MSW, LCSW Clinical Social Worker II [facility] Navigation  [phone]- work cell phone (preferred) [phone]- desk phone

## 2021-03-10 ENCOUNTER — Telehealth: Payer: Self-pay | Admitting: Licensed Clinical Social Worker

## 2021-03-10 NOTE — Telephone Encounter (Signed)
LCSW received f/u from pt regarding letter of support while out on scheduled PAL.  Contacted pt this morning via telephone. No answer at [phone]. Remain available to assist as needed  Octavio Graves, MSW, LCSW Clinical Social Worker II [facility] Navigation  [phone]- work cell phone (preferred) [phone]- desk phone

## 2021-05-25 ENCOUNTER — Telehealth (INDEPENDENT_AMBULATORY_CARE_PROVIDER_SITE_OTHER): Payer: Self-pay | Admitting: Physician Assistant

## 2021-05-25 ENCOUNTER — Telehealth: Payer: Self-pay | Admitting: Physician Assistant

## 2021-05-25 VITALS — Ht 66.0 in | Wt 362.0 lb

## 2021-05-25 DIAGNOSIS — K047 Periapical abscess without sinus: Secondary | ICD-10-CM

## 2021-05-25 MED ORDER — PENICILLIN V POTASSIUM 500 MG PO TABS: 500.0000 mg | ORAL_TABLET | Freq: Four times a day (QID) | ORAL | 0 refills | Status: AC

## 2021-05-25 NOTE — Progress Notes (Signed)
? ?  Virtual Visit via Video Note  ? ?IInda Coke, connected with  Savannah Roy  ([id number], 1960/10/09) on 05/25/21 at  3:40 PM EDT by a video-enabled telemedicine application and verified that I am speaking with the correct person using two identifiers. ? ?Location: ?Patient: Home ?Provider: [place] office ?  ?I discussed the limitations of evaluation and management by telemedicine and the availability of in person appointments. The patient expressed understanding and agreed to proceed.   ? ?History of Present Illness: ?Savannah Roy is a 61 y.o. who identifies as a female who was assigned female at birth, and is being seen today for dental pain. ? ?Her R lower incisor started hurting on Saturday. The tooth is cracked and needs to be pulled. Her dentist said that after she is on abx x 1 week, then she can have the procedure done. She is asking for abx today. ? ?Has been taking ibu and tylenol with mild relief of symptoms. ? ?Denies: fevers, chills ? ?Problems:  ?Patient Active Problem List  ? Diagnosis Date Noted  ? Obesity 10/01/2017  ? Severe episode of recurrent major depressive disorder, without psychotic features ([place]) 11/21/2016  ? LVH (left ventricular hypertrophy) due to hypertensive disease 05/10/2016  ? SEBACEOUS CYST, BREAST 08/14/2007  ? Essential hypertension 05/16/2006  ? OLIGOMENORRHEA 05/16/2006  ?  ?Allergies:  ?Allergies  ?Allergen Reactions  ? Erythromycin   ?  REACTION: rash  ? ?Medications:  ?Current Outpatient Medications:  ?  amLODipine-atorvastatin (CADUET) 10-40 MG tablet, Take 1 tablet by mouth daily., Disp: 90 tablet, Rfl: 3 ?  desvenlafaxine (PRISTIQ) 100 MG 24 hr tablet, TAKE 1 TABLET (100 MG) BY MOUTH DAILY, Disp: 90 tablet, Rfl: 3 ?  penicillin v potassium (VEETID) 500 MG tablet, Take 1 tablet (500 mg total) by mouth 4 (four) times daily for 10 days., Disp: 40 tablet, Rfl: 0 ?  oxyCODONE-acetaminophen (PERCOCET) 5-325 MG tablet, Take 1 tablet by mouth  every 4 (four) hours as needed for severe pain. (Patient not taking: Reported on 05/25/2021), Disp: 20 tablet, Rfl: 0 ?  traZODone (DESYREL) 50 MG tablet, Take 1 tablet (50 mg total) by mouth at bedtime. (Patient not taking: Reported on 05/25/2021), Disp: 90 tablet, Rfl: 0 ? ?Observations/Objective: ?Patient is well-developed, well-nourished in no acute distress.  ?Resting comfortably  at home.  ?Head is normocephalic, atraumatic.  ?No labored breathing.  ?Speech is clear and coherent with logical content.  ?Patient is alert and oriented at baseline.  ? ?Assessment and Plan: ?1. Dental infection ?New ?Start oral penicillin vk 500 mg QID x 7-10 days ?NSAIDs or tylenol prn for pain/inflammation ?Worsening precautions discussed ?Follow-up with [place] as needed ? ?Follow Up Instructions: ?I discussed the assessment and treatment plan with the patient. The patient was provided an opportunity to ask questions and all were answered. The patient agreed with the plan and demonstrated an understanding of the instructions.  A copy of instructions were sent to the patient via MyChart unless otherwise noted below.  ? ?The patient was advised to call back or seek an in-person evaluation if the symptoms worsen or if the condition fails to improve as anticipated. ? ?Inda Coke, PA ?

## 2021-05-25 NOTE — Telephone Encounter (Signed)
Please see message and advise 

## 2021-05-25 NOTE — Telephone Encounter (Signed)
Patient is under the care of Dentist: [facility]. ? ?She is having tooth pain - stated she does not have insurance and cannot keep going back and forth to dentist- Dentist recommended her to call her pcp and see if Savannah Roy would be ok sending in an antibiotic for tooth pain.  ? ?Tooth extraction: Not sch until 1 week of antibiotics first-   ?

## 2021-05-31 ENCOUNTER — Telehealth: Payer: Self-pay | Admitting: Licensed Clinical Social Worker

## 2021-05-31 NOTE — Telephone Encounter (Signed)
LCSW re-attempted to engage pt with text message. Inquired if pt would like me to send her past date documents back or shred out of date applications. Pt states she would like old documents shredded and new applications mailed to her, confirmed her address remains as listed on [facility]. I have shredded the following: bank statements from 2022, letter of support from 2022, [employer] bills from 2022, and old applications dated 2022. These are all out of date for current consideration for assistance. I mailed her a copy of her license needed for applications, as well as her 2021 letter of nonfiling, [employer], and [employer]. Pt will need to make an appointment to re-establish with care at [facility] (no showed and cancelled last two appts) in order for me to assist with these. A reminder noting that was sent with documents. ? ?I remain available should pt re-establish with care. ? ?Octavio Graves, MSW, LCSW ?Clinical Social Worker II ?[facility]  ?[phone]- work cell phone (preferred) ?[phone]- desk phone ? ?

## 2021-06-07 IMAGING — US US ABDOMEN COMPLETE
1 series · 14 of 25 positions shown · non-contrast
Comparison: Ultrasound 06/15/2014

CLINICAL DATA: Left upper quadrant pain

EXAM:
ABDOMEN ULTRASOUND COMPLETE

[Series 1: us abdomen complete · 14 of 109 slices shown]
[im 1/109]
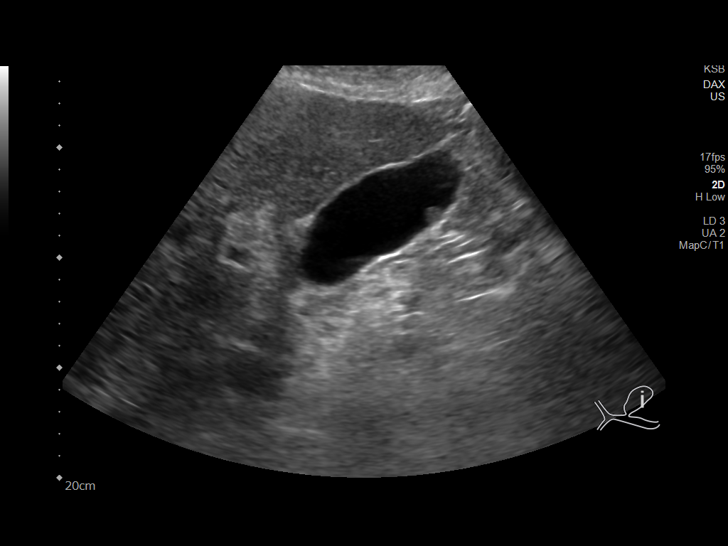
[im 10/109]
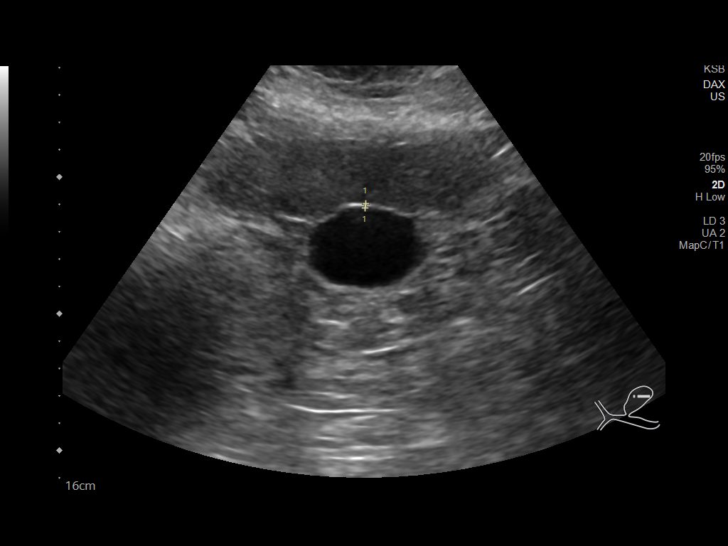
[im 19/109]
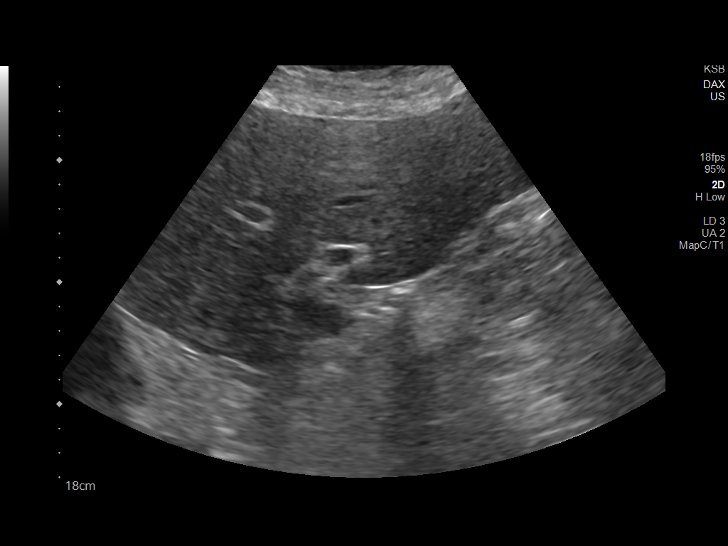
[im 28/109]
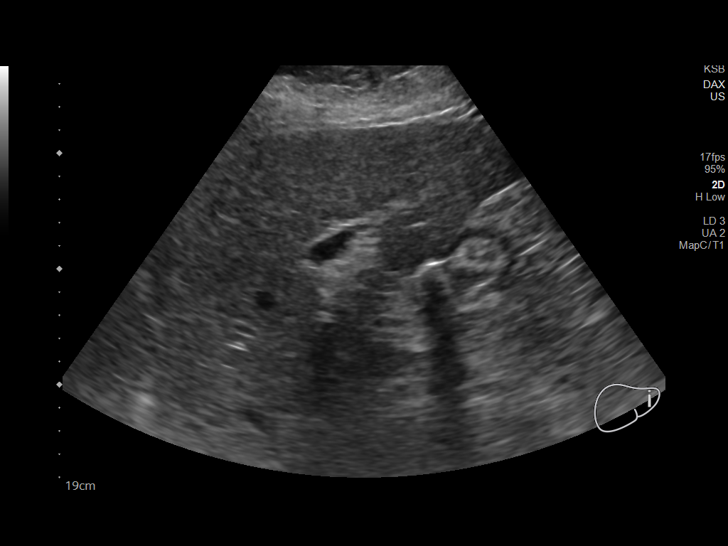
[im 37/109]
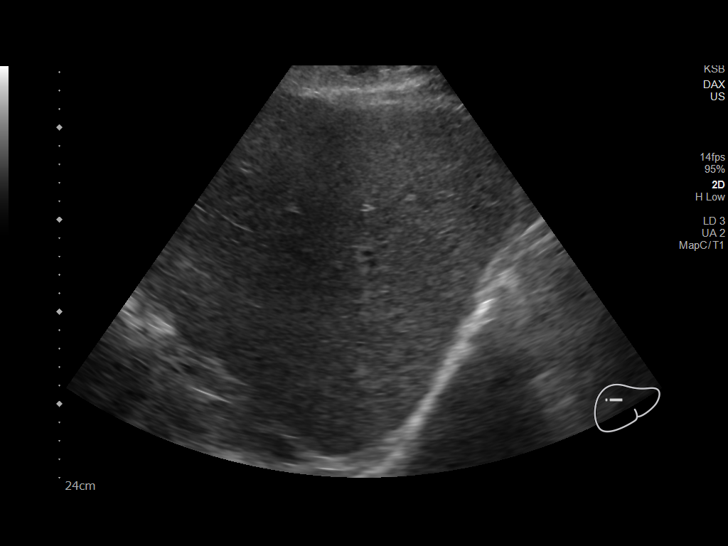
[im 41/109]
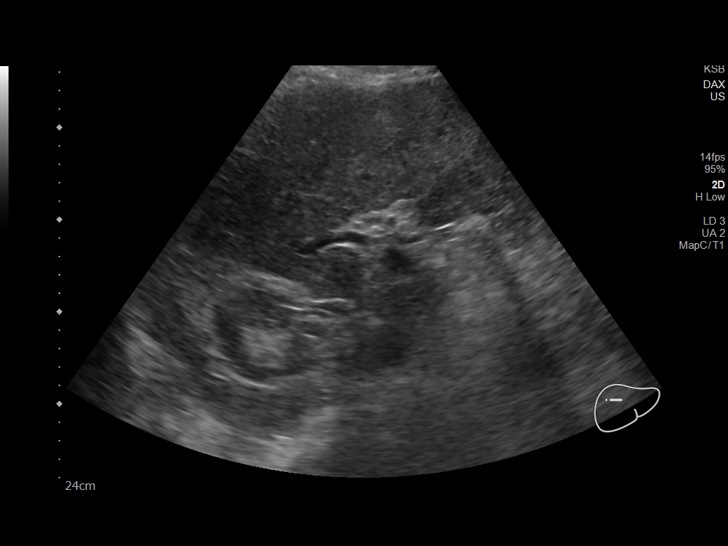
[im 50/109]
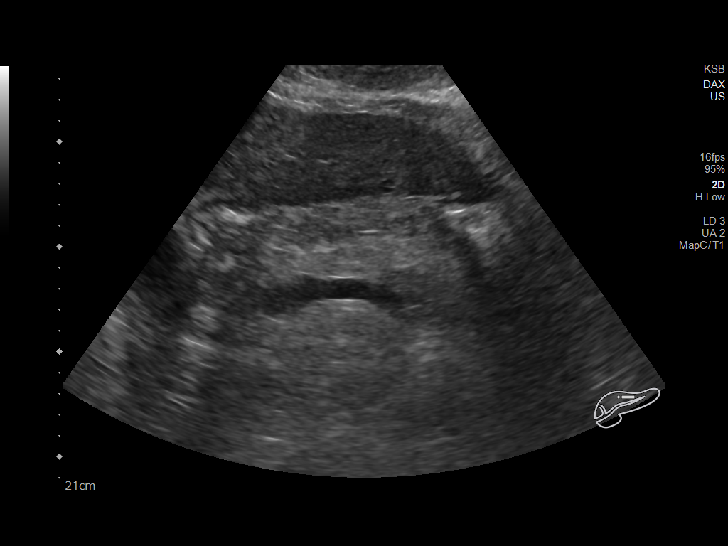
[im 59/109]
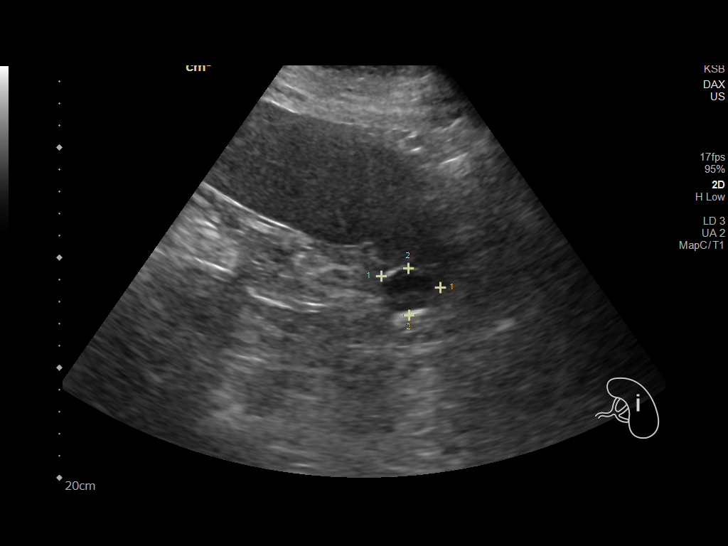
[im 68/109]
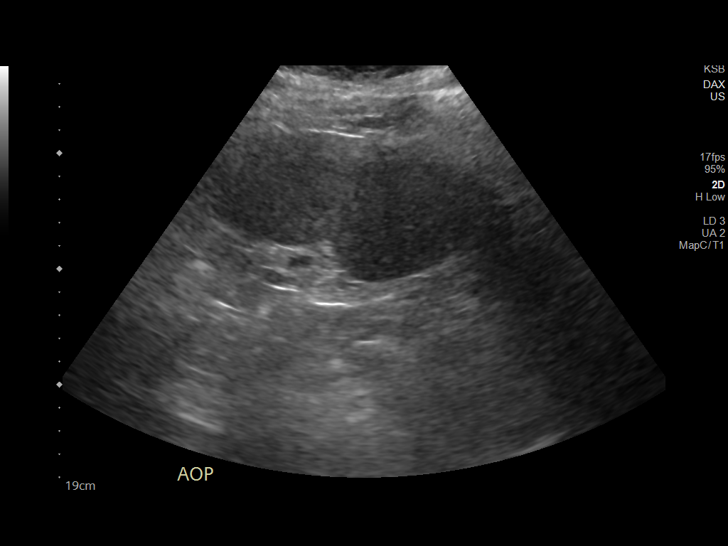
[im 73/109]
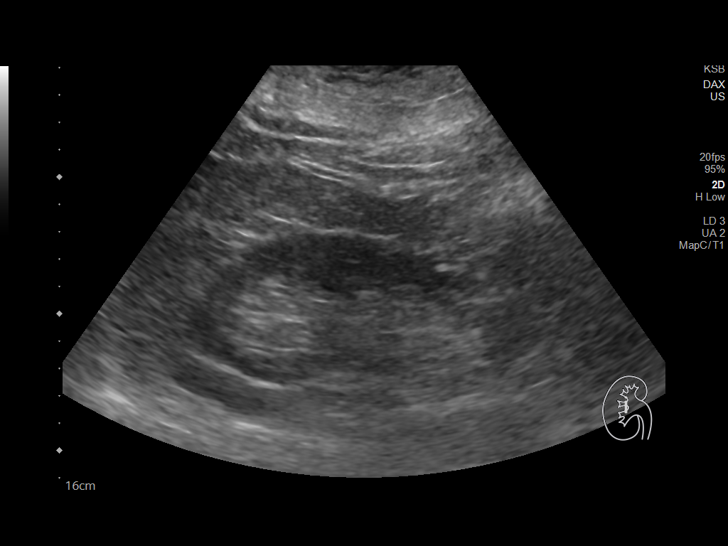
[im 82/109]
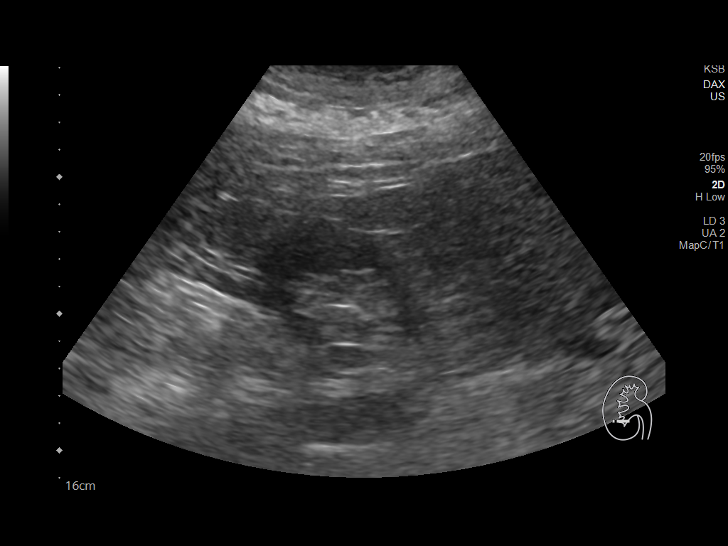
[im 91/109]
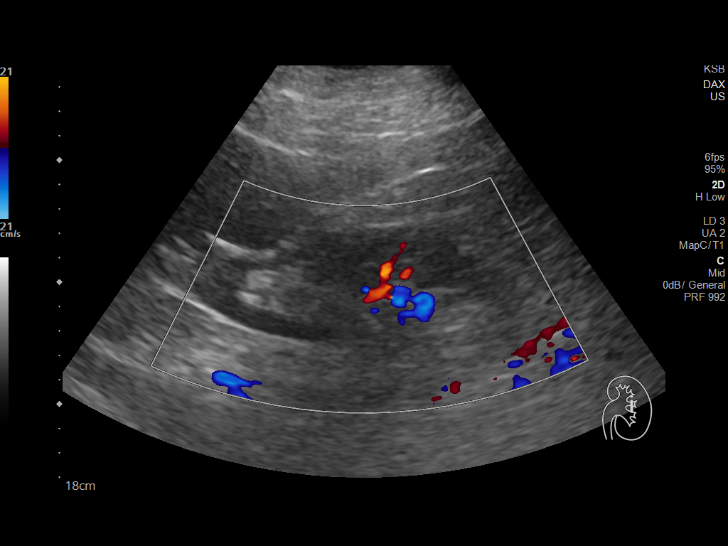
[im 100/109]
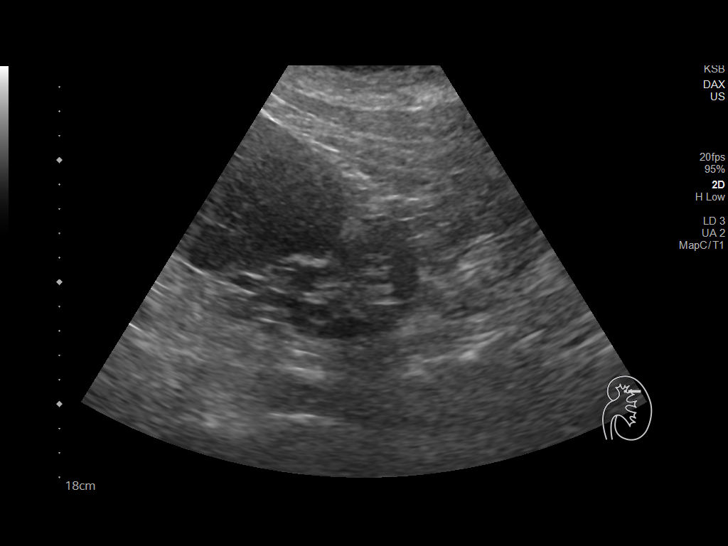
[im 109/109]
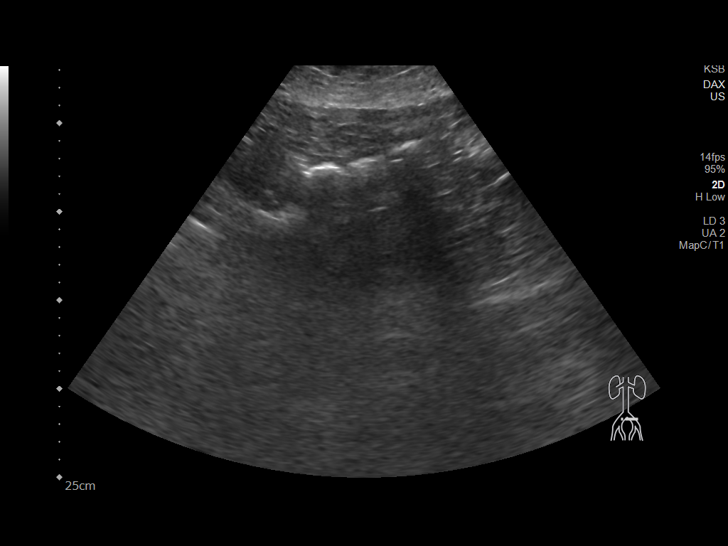

[14 of 25 positions shown; findings below may reference images not displayed]

FINDINGS: Gallbladder: Shadowing stone measuring 1.7 cm. Normal wall
thickness. Negative sonographic Murphy. Gallbladder polyp measuring
8 mm.

Common bile duct: Diameter: 4.4 mm

Liver: Slightly echogenic. No focal hepatic abnormality. Portal vein
is patent on color Doppler imaging with normal direction of blood
flow towards the liver.

IVC: No abnormality visualized.

Pancreas: Visualized portion unremarkable.

Spleen: Enlarged measuring 19.4 cm. Stable subcapsular cyst
measuring 2.9 cm.

Right Kidney: Length: 12.7 cm. Echogenicity within normal limits. No
mass or hydronephrosis visualized.

Left Kidney: Length: 13.5 cm. Echogenicity within normal limits. No
mass or hydronephrosis visualized.

Abdominal aorta: No aneurysm visualized.

Other findings: None.
IMPRESSION: 1. The spleen appears enlarged.
2. Small gallstone without sonographic evidence for acute
cholecystitis. 8 mm gallbladder polyp, suggest 1 year ultrasound
follow-up.
3. Slightly echogenic liver as may be seen with steatosis.

## 2021-11-17 ENCOUNTER — Other Ambulatory Visit: Payer: Self-pay | Admitting: Physician Assistant

## 2021-11-17 DIAGNOSIS — I10 Essential (primary) hypertension: Secondary | ICD-10-CM

## 2021-12-19 ENCOUNTER — Other Ambulatory Visit: Payer: Self-pay | Admitting: Physician Assistant

## 2022-05-09 ENCOUNTER — Ambulatory Visit: Payer: Medicaid Other | Admitting: Physician Assistant

## 2022-05-09 ENCOUNTER — Encounter: Payer: Self-pay | Admitting: Physician Assistant

## 2022-05-09 ENCOUNTER — Other Ambulatory Visit: Payer: Self-pay | Admitting: Physician Assistant

## 2022-05-09 VITALS — BP 150/80 | HR 96 | Temp 98.2°F | Ht 66.0 in | Wt 348.0 lb

## 2022-05-09 DIAGNOSIS — I10 Essential (primary) hypertension: Secondary | ICD-10-CM

## 2022-05-09 DIAGNOSIS — R8290 Unspecified abnormal findings in urine: Secondary | ICD-10-CM

## 2022-05-09 DIAGNOSIS — Z1231 Encounter for screening mammogram for malignant neoplasm of breast: Secondary | ICD-10-CM

## 2022-05-09 DIAGNOSIS — F332 Major depressive disorder, recurrent severe without psychotic features: Secondary | ICD-10-CM

## 2022-05-09 DIAGNOSIS — Z1211 Encounter for screening for malignant neoplasm of colon: Secondary | ICD-10-CM

## 2022-05-09 DIAGNOSIS — E785 Hyperlipidemia, unspecified: Secondary | ICD-10-CM

## 2022-05-09 DIAGNOSIS — R238 Other skin changes: Secondary | ICD-10-CM

## 2022-05-09 LAB — COMPREHENSIVE METABOLIC PANEL
ALT: 17 U/L (ref 0–35)
AST: 31 U/L (ref 0–37)
Albumin: 3.8 g/dL (ref 3.5–5.2)
Alkaline Phosphatase: 95 U/L (ref 39–117)
BUN: 20 mg/dL (ref 6–23)
CO2: 30 mEq/L (ref 19–32)
Calcium: 9.3 mg/dL (ref 8.4–10.5)
Chloride: 105 mEq/L (ref 96–112)
Creatinine, Ser: 1.1 mg/dL (ref 0.40–1.20)
GFR: 53.57 mL/min — ABNORMAL LOW (ref >60.00)
Glucose, Bld: 120 mg/dL — ABNORMAL HIGH (ref 70–99)
Potassium: 3.9 mEq/L (ref 3.5–5.1)
Sodium: 142 mEq/L (ref 135–145)
Total Bilirubin: 1.3 mg/dL — ABNORMAL HIGH (ref 0.2–1.2)
Total Protein: 6.9 g/dL (ref 6.0–8.3)

## 2022-05-09 LAB — POCT URINALYSIS DIPSTICK
Bilirubin, UA: NEGATIVE
Blood, UA: NEGATIVE
Glucose, UA: NEGATIVE
Ketones, UA: NEGATIVE
Leukocytes, UA: NEGATIVE
Nitrite, UA: NEGATIVE
Protein, UA: NEGATIVE
Spec Grav, UA: 1.025 (ref 1.010–1.025)
Urobilinogen, UA: 0.2 E.U./dL
pH, UA: 6 (ref 5.0–8.0)

## 2022-05-09 LAB — CBC WITH DIFFERENTIAL/PLATELET
Basophils Absolute: 0 10*3/uL (ref 0.0–0.1)
Basophils Relative: 0.3 % (ref 0.0–3.0)
Eosinophils Absolute: 0.1 10*3/uL (ref 0.0–0.7)
Eosinophils Relative: 2.3 % (ref 0.0–5.0)
HCT: 40.7 % (ref 36.0–46.0)
Hemoglobin: 13.2 g/dL (ref 12.0–15.0)
Lymphocytes Relative: 23.2 % (ref 12.0–46.0)
Lymphs Abs: 0.8 10*3/uL (ref 0.7–4.0)
MCHC: 32.5 g/dL (ref 30.0–36.0)
MCV: 87 fl (ref 78.0–100.0)
Monocytes Absolute: 0.2 10*3/uL (ref 0.1–1.0)
Monocytes Relative: 6 % (ref 3.0–12.0)
Neutro Abs: 2.4 10*3/uL (ref 1.4–7.7)
Neutrophils Relative %: 68.2 % (ref 43.0–77.0)
Platelets: 156 10*3/uL (ref 150.0–400.0)
RBC: 4.68 Mil/uL (ref 3.87–5.11)
RDW: 16.8 % — ABNORMAL HIGH (ref 11.5–15.5)
WBC: 3.5 10*3/uL — ABNORMAL LOW (ref 4.0–10.5)

## 2022-05-09 LAB — LIPID PANEL
Cholesterol: 202 mg/dL — ABNORMAL HIGH (ref 0–200)
HDL: 36.7 mg/dL — ABNORMAL LOW (ref >39.00)
LDL Cholesterol: 137 mg/dL — ABNORMAL HIGH (ref 0–99)
NonHDL: 165.19
Total CHOL/HDL Ratio: 6
Triglycerides: 142 mg/dL (ref 0.0–149.0)
VLDL: 28.4 mg/dL (ref 0.0–40.0)

## 2022-05-09 MED ORDER — AMLODIPINE-ATORVASTATIN 10-40 MG PO TABS: 1.0000 | ORAL_TABLET | Freq: Every day | ORAL | 0 refills | Status: DC

## 2022-05-09 MED ORDER — HYDROXYZINE HCL 25 MG PO TABS: 25.0000 mg | ORAL_TABLET | Freq: Every evening | ORAL | 0 refills | Status: DC | PRN

## 2022-05-09 MED ORDER — DESVENLAFAXINE SUCCINATE ER 100 MG PO TB24: ORAL_TABLET | ORAL | 3 refills | Status: DC

## 2022-05-09 MED ORDER — CEPHALEXIN 500 MG PO CAPS: 500.0000 mg | ORAL_CAPSULE | Freq: Two times a day (BID) | ORAL | 0 refills | Status: DC

## 2022-05-09 NOTE — Patient Instructions (Signed)
It was great to see you!  Start keflex for your skin and urinary symptoms I'm placing urgent referral to dermatology for your skin -- please call [phone] to schedule  I'm refilling your medications  Trial hydroxyzine 25 mg nightly for your insomnia -- may increase to 50 mg if needed  I'm placing referral to gastroenterology for your colonoscopy  Please schedule a mammogram  Let's follow-up in 3 months for a physical with a PAP smear, sooner if you have concerns.  Take care,  Inda Coke PA-C

## 2022-05-09 NOTE — Progress Notes (Signed)
Savannah Roy is a 62 y.o. female here for a follow up of a pre-existing problem.  History of Present Illness:   Chief Complaint  Patient presents with   Medication Refill    HPI  HTN; HLD Her blood pressure is elevated in clinic today, which she attributes to her not taking her antihypertensive this morning.  She is taking Amlodipine 10 mg and Atorvastatin 40mg .  Depression:  She has been taking Pristiq. She previously has tried Prozac and Paxil, and notes intolerances.  She has had intolerances for other antidepressants.  She also reports taking Effexor and Wellbutrin in the past without any significant benefit Denies current SI/HI  Urine:  She notes her urine has been having a foul odor.  Denies painful urination, lower abdominal pain.  Skin breakdown She reports having areas of skin breakdown on her pubic area Area can bleed at times when irritated Would like to see a dermatologist for further eval She is requesting a diaper due to drainage.   Insomnia:  She has not been sleeping well.  She notes it worsens when she has commitments on the next day. She has tried Trazodone in the past -- this made her insomnia worse  Past Medical History:  Diagnosis Date   Anxiety    Depression    Hypertension    LVH (left ventricular hypertrophy) due to hypertensive disease 05/10/2016   Shortness of breath 04/07/2016   Sleep apnea      Social History   Tobacco Use   Smoking status: Former    Types: Cigarettes    Quit date: 2015    Years since quitting: 9.2   Smokeless tobacco: Never  Vaping Use   Vaping Use: Never used  Substance Use Topics   Alcohol use: Yes    Comment: occasionally   Drug use: No    Past Surgical History:  Procedure Laterality Date   CHOLECYSTECTOMY N/A 12/17/2020   Procedure: LAPAROSCOPIC CHOLECYSTECTOMY;  Surgeon: Felicie Morn, MD;  Location: [facility];  Service: General;  Laterality: N/A;   CYSTECTOMY Right    gum cyst removal    ENDOMETRIAL ABLATION W/ NOVASURE  2007   [place] ARTHROSCOPY Right 2005    Family History  Problem Relation Age of Onset   Cancer Mother    Heart attack Mother    CVA Mother    Thyroid disease Mother    Kidney failure Father    Thyroid disease Sister    Heart failure Maternal Grandmother     Allergies  Allergen Reactions   Erythromycin     REACTION: rash    Current Medications:   Current Outpatient Medications:    cephALEXin (KEFLEX) 500 MG capsule, Take 1 capsule (500 mg total) by mouth 2 (two) times daily., Disp: 20 capsule, Rfl: 0   hydrOXYzine (ATARAX) 25 MG tablet, Take 1 tablet (25 mg total) by mouth at bedtime as needed., Disp: 30 tablet, Rfl: 0   amLODipine-atorvastatin (CADUET) 10-40 MG tablet, Take 1 tablet by mouth daily., Disp: 90 tablet, Rfl: 0   desvenlafaxine (PRISTIQ) 100 MG 24 hr tablet, TAKE 1 TABLET (100 MG) BY MOUTH DAILY, Disp: 90 tablet, Rfl: 3   Review of Systems:   Review of Systems  Genitourinary:        (+) urine with foul odor  Psychiatric/Behavioral:  Positive for depression. The patient has insomnia.     Vitals:   Vitals:   05/09/22 0826 05/09/22 0857  BP: Marland Kitchen)  150/90 (!) 150/80  Pulse: 96   Temp: 98.2 F (36.8 C)   TempSrc: Temporal   SpO2: 96%   Weight: (!) 348 lb (157.9 kg)   Height: 5\' 6"  (1.676 m)      Body mass index is 56.17 kg/m.  Physical Exam:   Physical Exam Vitals and nursing note reviewed.  Constitutional:      General: She is not in acute distress.    Appearance: She is well-developed. She is not ill-appearing or toxic-appearing.  Cardiovascular:     Rate and Rhythm: Normal rate and regular rhythm.     Pulses: Normal pulses.     Heart sounds: Normal heart sounds, S1 normal and S2 normal.  Pulmonary:     Effort: Pulmonary effort is normal.     Breath sounds: Normal breath sounds.  Skin:    General: Skin is warm and dry.  Neurological:     Mental Status: She is alert.     GCS: GCS eye  subscore is 4. GCS verbal subscore is 5. GCS motor subscore is 6.  Psychiatric:        Speech: Speech normal.        Behavior: Behavior normal. Behavior is cooperative.    Results for orders placed or performed in visit on 05/09/22  POCT urinalysis dipstick  Result Value Ref Range   Color, UA yellow    Clarity, UA cloudy    Glucose, UA Negative Negative   Bilirubin, UA Negative    Ketones, UA Negative    Spec Grav, UA 1.025 1.010 - 1.025   Blood, UA Negative    pH, UA 6.0 5.0 - 8.0   Protein, UA Negative Negative   Urobilinogen, UA 0.2 0.2 or 1.0 E.U./dL   Nitrite, UA Negative    Leukocytes, UA Negative Negative   Appearance     Odor       Assessment and Plan:   Abnormal urine odor UA normal Will order culture for further evaluation Will empirically start keflex for UTI and skin issues Follow-up if new/worsening sx  Essential hypertension Above goal Has not take medication yet Continue amlodopine 10 mg daily Follow-up in 3 months for CPE If home BP consistently > 140/90, recommend she reach out to [place] Update blood work today  Severe episode of recurrent major depressive disorder, without psychotic features ([place]) Well controlled Continue pristiq 100 mg daily Follow-up if new/worsening sx Denies SI/HI  Hyperlipidemia, unspecified hyperlipidemia type Update lipid panel  Continue atorvastatin 40 mg daily - may need to adjust based on results  Special screening for malignant neoplasms, colon Referral placed for colonoscopy  Skin breakdown Referral to dermatology for further evaluation If diapers needed, she will reach out   I,Rachel Rivera,acting as a scribe for [employer], PA.,have documented all relevant documentation on the behalf of Inda Coke, PA,as directed by  Inda Coke, PA while in the presence of Inda Coke, [place].  I, Inda Coke, [place], have reviewed all documentation for this visit. The documentation on 05/09/22 for the exam,  diagnosis, procedures, and orders are all accurate and complete.   Inda Coke, PA-C

## 2022-05-12 LAB — URINE CULTURE
MICRO NUMBER:: 14743105
SPECIMEN QUALITY:: ADEQUATE

## 2022-05-15 ENCOUNTER — Other Ambulatory Visit: Payer: Self-pay | Admitting: *Deleted

## 2022-05-15 ENCOUNTER — Encounter: Payer: Self-pay | Admitting: Gastroenterology

## 2022-05-15 ENCOUNTER — Other Ambulatory Visit: Payer: Self-pay | Admitting: Physician Assistant

## 2022-05-15 MED ORDER — AMLODIPINE-ATORVASTATIN 10-80 MG PO TABS: 1.0000 | ORAL_TABLET | Freq: Every day | ORAL | 1 refills | Status: DC

## 2022-05-15 MED ORDER — AMLODIPINE BESYLATE 10 MG PO TABS: 10.0000 mg | ORAL_TABLET | Freq: Every day | ORAL | 1 refills | Status: AC

## 2022-05-15 MED ORDER — ATORVASTATIN CALCIUM 80 MG PO TABS
80.0000 mg | ORAL_TABLET | Freq: Every day | ORAL | 1 refills | Status: AC
Start: 2022-05-15 — End: ?

## 2022-05-22 ENCOUNTER — Telehealth: Payer: Self-pay | Admitting: *Deleted

## 2022-05-22 NOTE — Telephone Encounter (Signed)
Yesi,  This pt's BMI is greater than 50; their procedure will need to be performed at the hospital.  Thanks,  Gaylene Moylan 

## 2022-05-25 NOTE — Telephone Encounter (Signed)
Patient has been added to hospital wait list 

## 2022-05-25 NOTE — Telephone Encounter (Signed)
We have waiting lists for people who need both diagnostic and screening procedures at the hospital.  I have added my nurse to this message to add the patient to my list.  Please let the patient know it may be as long as 6 months before a slot is available.  - HD

## 2022-05-25 NOTE — Telephone Encounter (Signed)
Dr. Myrtie Neither I just wanted to make you aware I have cancelled this pt due to her BMI > 50. I have called the patient and made her aware and scheduled her for an OV with one of the APP to further discuss. If you would prefer she be a direct I will be glad to call her back to inform her.  Thank you,  Annie Sable

## 2022-05-31 ENCOUNTER — Other Ambulatory Visit: Payer: Self-pay | Admitting: Physician Assistant

## 2022-06-12 ENCOUNTER — Other Ambulatory Visit (HOSPITAL_COMMUNITY): Payer: Self-pay

## 2022-06-19 ENCOUNTER — Encounter: Payer: Medicaid Other | Admitting: Gastroenterology

## 2022-06-21 ENCOUNTER — Ambulatory Visit: Payer: Medicaid Other

## 2022-06-27 ENCOUNTER — Encounter: Payer: Self-pay | Admitting: Dermatology

## 2022-06-27 ENCOUNTER — Ambulatory Visit: Payer: Medicaid Other | Admitting: Dermatology

## 2022-06-27 VITALS — BP 135/87

## 2022-06-27 DIAGNOSIS — L732 Hidradenitis suppurativa: Secondary | ICD-10-CM | POA: Diagnosis not present

## 2022-06-27 DIAGNOSIS — L821 Other seborrheic keratosis: Secondary | ICD-10-CM

## 2022-06-27 MED ORDER — DOXYCYCLINE HYCLATE 100 MG PO CAPS: 100.0000 mg | ORAL_CAPSULE | Freq: Every day | ORAL | 0 refills | Status: DC

## 2022-06-27 MED ORDER — MUPIROCIN 2 % EX OINT: 1.0000 | TOPICAL_OINTMENT | Freq: Every day | CUTANEOUS | 1 refills | Status: AC

## 2022-06-27 NOTE — Progress Notes (Signed)
   New Patient Visit   Subjective  Savannah Roy is a 62 y.o. female who presents for the following: She has a brown waxy growths on the left chest and under the left breast x 1 year. They are not itchy or irritated.  She also has lump/boil type lesions at the groin, genitals, buttocks and underarms x 5 years. She says sometimes they drain open. She had been on Keflex 3 weeks ago which helped a little.   The following portions of the chart were reviewed this encounter and updated as appropriate: medications, allergies, medical history  Review of Systems:  No other skin or systemic complaints except as noted in HPI or Assessment and Plan.  Objective  Well appearing patient in no apparent distress; mood and affect are within normal limits.  A focused examination was performed of the following areas: Chest, axillae, genitals, buttocks  Relevant exam findings are noted in the Assessment and Plan.    Assessment & Plan   SEBORRHEIC KERATOSIS - Stuck-on, waxy, tan-brown papules and/or plaques  - Benign-appearing - Discussed benign etiology and prognosis. - Observe - Call for any changes    HIDRADENITIS SUPPURATIVA Exam:  Hurley Stage 2: single or multiple, widely separated recurrent abscesses with sinus tract formation and scarring   Flared  Hidradenitis Suppurativa is a chronic; persistent; non-curable, but treatable condition due to abnormal inflamed sweat glands in the body folds (axilla, inframammary, groin, medial thighs), causing recurrent painful draining cysts and scarring. It can be associated with severe scarring acne and cysts; also abscesses and scarring of scalp. The goal is control and prevention of flares, as it is not curable. Scars are permanent and can be thickened. Treatment may include daily use of topical medication and oral antibiotics.  Oral isotretinoin may also be helpful.  For some cases, Humira or Cosentyx (biologic injections) may be prescribed to  decrease the inflammatory process and prevent flares.  When indicated, inflamed cysts may also be treated surgically.  Treatment Plan: Cleansing with Dove Antibacterial soap Mupirocin ointment once daily to inflamed areas Doxycycline 100mg  daily with dinner for one month  After areas have calmed down, they can be treated with ILK injections.     Return in about 1 month (around 07/28/2022) for HS.  Jaclynn Guarneri, CMA, am acting as scribe for [employer], DO.   Documentation: I have reviewed the above documentation for accuracy and completeness, and I agree with the above.  Langston Reusing, DO

## 2022-06-27 NOTE — Patient Instructions (Addendum)
Hidradenitis Suppurativa is a chronic; persistent; non-curable, but treatable condition due to abnormal inflamed sweat glands in the body folds (axilla, inframammary, groin, medial thighs), causing recurrent painful draining cysts and scarring. It can be associated with severe scarring acne and cysts; also abscesses and scarring of scalp. The goal is control and prevention of flares, as it is not curable. Scars are permanent and can be thickened. Treatment may include daily use of topical medication and oral antibiotics.  Oral isotretinoin may also be helpful.  For some cases, Humira or Cosentyx (biologic injections) may be prescribed to decrease the inflammatory process and prevent flares.  When indicated, inflamed cysts may also be treated surgically.   Due to recent changes in healthcare laws, you may see results of your pathology and/or laboratory studies on MyChart before the doctors have had a chance to review them. We understand that in some cases there may be results that are confusing or concerning to you. Please understand that not all results are received at the same time and often the doctors may need to interpret multiple results in order to provide you with the best plan of care or course of treatment. Therefore, we ask that you please give [place] 2 business days to thoroughly review all your results before contacting the office for clarification. Should we see a critical lab result, you will be contacted sooner.   If You Need Anything After Your Visit  If you have any questions or concerns for your doctor, please call our main line at [phone] If no one answers, please leave a voicemail as directed and we will return your call as soon as possible. Messages left after 4 pm will be answered the following business day.   You may also send [place] a message via MyChart. We typically respond to MyChart messages within 1-2 business days.  For prescription refills, please ask your pharmacy  to contact our office. Our fax number is [phone].  If you have an urgent issue when the clinic is closed that cannot wait until the next business day, you can page your doctor at the number below.    Please note that while we do our best to be available for urgent issues outside of office hours, we are not available 24/7.   If you have an urgent issue and are unable to reach [place], you may choose to seek medical care at your doctor's office, retail clinic, urgent care center, or emergency room.  If you have a medical emergency, please immediately call 911 or go to the emergency department. In the event of inclement weather, please call our main line at [phone] for an update on the status of any delays or closures.  Dermatology Medication Tips: Please keep the boxes that topical medications come in in order to help keep track of the instructions about where and how to use these. Pharmacies typically print the medication instructions only on the boxes and not directly on the medication tubes.   If your medication is too expensive, please contact our office at [phone] or send [place] a message through MyChart.   We are unable to tell what your co-pay for medications will be in advance as this is different depending on your insurance coverage. However, we may be able to find a substitute medication at lower cost or fill out paperwork to get insurance to cover a needed medication.   If a prior authorization is required to get your medication covered by your insurance  company, please allow [place] 1-2 business days to complete this process.  Drug prices often vary depending on where the prescription is filled and some pharmacies may offer cheaper prices.  The website www.goodrx.com contains coupons for medications through different pharmacies. The prices here do not account for what the cost may be with help from insurance (it may be cheaper with your insurance), but the website can give you the  price if you did not use any insurance.  - You can print the associated coupon and take it with your prescription to the pharmacy.  - You may also stop by our office during regular business hours and pick up a GoodRx coupon card.  - If you need your prescription sent electronically to a different pharmacy, notify our office through [facility] or by phone at [phone]

## 2022-07-20 ENCOUNTER — Ambulatory Visit: Payer: Medicaid Other | Admitting: Physician Assistant

## 2022-07-21 ENCOUNTER — Ambulatory Visit
Admission: RE | Admit: 2022-07-21 | Discharge: 2022-07-21 | Disposition: A | Discharge status: Home / Self Care | Payer: Medicaid Other | Source: Ambulatory Visit | Attending: Physician Assistant | Admitting: Physician Assistant

## 2022-07-21 DIAGNOSIS — Z1231 Encounter for screening mammogram for malignant neoplasm of breast: Secondary | ICD-10-CM

## 2022-07-25 ENCOUNTER — Other Ambulatory Visit: Payer: Self-pay | Admitting: Physician Assistant

## 2022-07-25 DIAGNOSIS — R928 Other abnormal and inconclusive findings on diagnostic imaging of breast: Secondary | ICD-10-CM

## 2022-07-31 ENCOUNTER — Ambulatory Visit: Payer: Medicaid Other | Admitting: Dermatology

## 2022-08-04 ENCOUNTER — Ambulatory Visit
Admission: RE | Admit: 2022-08-04 | Discharge: 2022-08-04 | Disposition: A | Discharge status: Home / Self Care | Payer: Self-pay | Source: Ambulatory Visit | Attending: Physician Assistant | Admitting: Physician Assistant

## 2022-08-04 ENCOUNTER — Ambulatory Visit
Admission: RE | Admit: 2022-08-04 | Discharge: 2022-08-04 | Disposition: A | Discharge status: Home / Self Care | Payer: Medicaid Other | Source: Ambulatory Visit | Attending: Physician Assistant | Admitting: Physician Assistant

## 2022-08-04 DIAGNOSIS — R928 Other abnormal and inconclusive findings on diagnostic imaging of breast: Secondary | ICD-10-CM

## 2022-08-10 ENCOUNTER — Ambulatory Visit: Payer: Medicaid Other | Admitting: Dermatology

## 2022-09-04 ENCOUNTER — Other Ambulatory Visit: Payer: Self-pay | Admitting: Dermatology

## 2022-09-05 ENCOUNTER — Other Ambulatory Visit: Payer: Self-pay

## 2022-09-05 MED ORDER — DOXYCYCLINE HYCLATE 100 MG PO CAPS: 100.0000 mg | ORAL_CAPSULE | Freq: Every day | ORAL | 0 refills | Status: DC

## 2022-10-19 ENCOUNTER — Ambulatory Visit (INDEPENDENT_AMBULATORY_CARE_PROVIDER_SITE_OTHER): Payer: Medicaid Other | Admitting: Dermatology

## 2022-10-19 DIAGNOSIS — Z111 Encounter for screening for respiratory tuberculosis: Secondary | ICD-10-CM | POA: Diagnosis not present

## 2022-10-19 DIAGNOSIS — L732 Hidradenitis suppurativa: Secondary | ICD-10-CM | POA: Diagnosis not present

## 2022-10-19 DIAGNOSIS — L723 Sebaceous cyst: Secondary | ICD-10-CM

## 2022-10-19 MED ORDER — DOXYCYCLINE HYCLATE 100 MG PO CAPS
100.0000 mg | ORAL_CAPSULE | Freq: Every day | ORAL | 2 refills | Status: AC
Start: 2022-10-19 — End: 2023-01-17

## 2022-10-19 MED ORDER — TRIAMCINOLONE ACETONIDE 40 MG/ML IJ SUSP
40.0000 mg | Freq: Once | INTRAMUSCULAR | Status: AC
Start: 2022-10-19 — End: 2022-10-19
  Administered 2022-10-19: 40 mg via INTRAMUSCULAR

## 2022-10-19 NOTE — Progress Notes (Signed)
Follow-Up Visit   Subjective  Savannah Roy is a 62 y.o. female who presents for the following: Hidradenitis Suppurativa  Patient present today for follow up visit for Hidradenitis Suppurativa. Patient was last evaluated on 06/27/22. Patient reports sxs are Better/not at goal/ resolved. Patient reports no medication changes. Patient reports she finished the course of Doxycycline on 3rd week of August. She currently washes her body with Dove anti-bacterial. Patient reports she has had several flares since her previous visit.   The following portions of the chart were reviewed this encounter and updated as appropriate: medications, allergies, medical history  Review of Systems:  No other skin or systemic complaints except as noted in HPI or Assessment and Plan.  Objective  Well appearing patient in no apparent distress; mood and affect are within normal limits.  A full examination was performed including axillae, buttocks, genitals & groin. All findings within normal limits unless otherwise noted below.   Relevant exam findings are noted in the Assessment and Plan. Left Labium Majus, Right Labium Majus Hurley Stage 4: multiple interconnected sinus tracts and abscesses throughout and entire area    Assessment & Plan   HIDRADENITIS SUPPURATIVA Exam: Hurley Stage 4: multiple interconnected sinus tracts and abscesses throughout and entire area, At B/L Axilla No scarring and comedone plugging  Flared  Hidradenitis Suppurativa is a chronic; persistent; non-curable, but treatable condition due to abnormal inflamed sweat glands in the body folds (axilla, inframammary, groin, medial thighs), causing recurrent painful draining cysts and scarring. It can be associated with severe scarring acne and cysts; also abscesses and scarring of scalp. The goal is control and prevention of flares, as it is not curable. Scars are permanent and can be thickened. Treatment may include daily use of topical  medication and oral antibiotics.  Oral isotretinoin may also be helpful.  For some cases, Humira or Cosentyx (biologic injections) may be prescribed to decrease the inflammatory process and prevent flares.  When indicated, inflamed cysts may also be treated surgically.  Treatment Plan: - Recommended incorporating Cerave 10%BPO in with her Dove anti bacterial body wash - We will plan to 100 mg doxycycline, Daily with heavy meal - We will discussed if ILK injections and oral anti biotics are ineffective, we will plan to start on Humira or Cosentix, patient provided with lab slip to screen for TB prior to starting Biologic medication -  We will plan to follow up in 6 weeks to re-assess   Screening-pulmonary TB  Related Procedures QuantiFERON-TB Gold Plus  Hidradenitis suppurativa Left Labium Majus; Right Labium Majus  Procedure Note Intralesional Injection  Location: Left and Right Labia Majus  Informed Consent: Discussed risks (infection, pain, bleeding, bruising, thinning of the skin, loss of skin pigment, lack of resolution, and recurrence of lesion) and benefits of the procedure, as well as the alternatives. Informed consent was obtained. Preparation: The area was prepared a standard fashion.  Anesthesia: None  Procedure Details: An intralesional injection was performed with Kenalog 40 mg/cc. 0.3 cc in total were injected. NDC #: 16109-604-54 Exp: Dec 2024  Total number of injections: 7  Plan: The patient was instructed on post-op care. Recommend OTC analgesia as needed for pain.   triamcinolone acetonide (KENALOG-40) injection 40 mg - Left Labium Majus     Return for HS F/U.  Documentation: I have reviewed the above documentation for accuracy and completeness, and I agree with the above.  I, Nell Range, am acting as Neurosurgeon for [employer], DO.  Langston Reusing, DO

## 2022-10-19 NOTE — Patient Instructions (Addendum)
Hello Savannah Roy,  Thank you for visiting [place] today. We appreciate your commitment to improving your health. Here is a summary of the key instructions from today's consultation:  - Medications:   - Continue with Doxycycline 100 mg daily, to be taken with dinner.   - Use a mixture of CeraVe 10% Benzoyl Peroxide Wash with Dove antibacterial soap as directed to manage skin irritation and dryness.  - Laboratory Tests:   - Please complete the lab slip provided for TB screening and Quantiferon test to ensure safety before starting new medication.  - Treatment Plan:   - Kenalog 40 injections were administered today (0.3 total into seven lesions) to help manage inflammation.   - We discussed the potential start of Cosentyx injections as a next step in treatment, depending on your lab results and response to current treatments.  - Follow-Up:   - We will see you again in six weeks for a follow-up appointment to assess progress and discuss further treatment options, including detailed information about Cosentyx.  - Educational Material:   - Samples of Benzoyl Peroxide Wash were provided. Instructions for use and purchase options were discussed.  We look forward to seeing the positive changes in your next visit. If you have any questions or concerns before then, please do not hesitate to contact our office.  Warm regards,  Dr. Langston Reusing, MD Dermatology  Important Information  Due to recent changes in healthcare laws, you may see results of your pathology and/or laboratory studies on MyChart before the doctors have had a chance to review them. We understand that in some cases there may be results that are confusing or concerning to you. Please understand that not all results are received at the same time and often the doctors may need to interpret multiple results in order to provide you with the best plan of care or course of treatment. Therefore, we ask that you please give [place] 2 business days to  thoroughly review all your results before contacting the office for clarification. Should we see a critical lab result, you will be contacted sooner.   If You Need Anything After Your Visit  If you have any questions or concerns for your doctor, please call our main line at [phone] If no one answers, please leave a voicemail as directed and we will return your call as soon as possible. Messages left after 4 pm will be answered the following business day.   You may also send [place] a message via MyChart. We typically respond to MyChart messages within 1-2 business days.  For prescription refills, please ask your pharmacy to contact our office. Our fax number is [phone].  If you have an urgent issue when the clinic is closed that cannot wait until the next business day, you can page your doctor at the number below.    Please note that while we do our best to be available for urgent issues outside of office hours, we are not available 24/7.   If you have an urgent issue and are unable to reach [place], you may choose to seek medical care at your doctor's office, retail clinic, urgent care center, or emergency room.  If you have a medical emergency, please immediately call 911 or go to the emergency department. In the event of inclement weather, please call our main line at [phone] for an update on the status of any delays or closures.  Dermatology Medication Tips: Please keep the boxes that topical medications come in in order  to help keep track of the instructions about where and how to use these. Pharmacies typically print the medication instructions only on the boxes and not directly on the medication tubes.   If your medication is too expensive, please contact our office at [phone] or send [place] a message through MyChart.   We are unable to tell what your co-pay for medications will be in advance as this is different depending on your insurance coverage. However, we may be able to  find a substitute medication at lower cost or fill out paperwork to get insurance to cover a needed medication.   If a prior authorization is required to get your medication covered by your insurance company, please allow [place] 1-2 business days to complete this process.  Drug prices often vary depending on where the prescription is filled and some pharmacies may offer cheaper prices.  The website www.goodrx.com contains coupons for medications through different pharmacies. The prices here do not account for what the cost may be with help from insurance (it may be cheaper with your insurance), but the website can give you the price if you did not use any insurance.  - You can print the associated coupon and take it with your prescription to the pharmacy.  - You may also stop by our office during regular business hours and pick up a GoodRx coupon card.  - If you need your prescription sent electronically to a different pharmacy, notify our office through [facility] or by phone at [phone]

## 2022-10-23 ENCOUNTER — Ambulatory Visit: Payer: Medicaid Other | Admitting: Physician Assistant

## 2022-10-24 ENCOUNTER — Encounter: Payer: Self-pay | Admitting: Physician Assistant

## 2022-10-24 ENCOUNTER — Ambulatory Visit: Payer: Medicaid Other | Admitting: Physician Assistant

## 2022-10-24 VITALS — BP 130/76 | HR 78 | Temp 97.7°F | Ht 66.0 in | Wt 336.4 lb

## 2022-10-24 DIAGNOSIS — R21 Rash and other nonspecific skin eruption: Secondary | ICD-10-CM | POA: Diagnosis not present

## 2022-10-24 MED ORDER — HYDROXYZINE HCL 25 MG PO TABS: 25.0000 mg | ORAL_TABLET | Freq: Every evening | ORAL | 1 refills | Status: DC | PRN

## 2022-10-24 NOTE — Progress Notes (Signed)
Savannah Roy is a 62 y.o. female here for a new problem.  History of Present Illness:   Chief Complaint  Patient presents with   Rash    Pt c/o rash on left side of upper chest area, started few days ago, itching at night.     Rash  She complains of a rash on her upper chest area that is red, blister like, and crusted. The rash began one week ago. The rash radiates to her hairline and neck. She is also experiencing accompanying itching.  She believes that the rash can be caused by poison oak as her sister usually get it. She has been using a Cerave acne cream on the area.     Past Medical History:  Diagnosis Date   Anxiety    Depression    Hypertension    LVH (left ventricular hypertrophy) due to hypertensive disease 05/10/2016   Shortness of breath 04/07/2016   Sleep apnea      Social History   Tobacco Use   Smoking status: Former    Current packs/day: 0.00    Types: Cigarettes    Quit date: 2015    Years since quitting: 9.6   Smokeless tobacco: Never  Vaping Use   Vaping status: Never Used  Substance Use Topics   Alcohol use: Yes    Comment: occasionally   Drug use: No    Past Surgical History:  Procedure Laterality Date   CHOLECYSTECTOMY N/A 12/17/2020   Procedure: LAPAROSCOPIC CHOLECYSTECTOMY;  Surgeon: Quentin Ore, MD;  Location: [facility];  Service: General;  Laterality: N/A;   CYSTECTOMY Right    gum cyst removal   ENDOMETRIAL ABLATION W/ NOVASURE  2007   HERNIA REPAIR  1985   KNEE ARTHROSCOPY Right 2005    Family History  Problem Relation Age of Onset   Cancer Mother    Heart attack Mother    CVA Mother    Thyroid disease Mother    Kidney failure Father    Thyroid disease Sister    Heart failure Maternal Grandmother     Allergies  Allergen Reactions   Erythromycin     REACTION: rash    Current Medications:   Current Outpatient Medications:    amLODipine (NORVASC) 10 MG tablet, Take 1 tablet (10 mg total) by mouth daily.,  Disp: 90 tablet, Rfl: 1   atorvastatin (LIPITOR) 80 MG tablet, Take 1 tablet (80 mg total) by mouth daily., Disp: 90 tablet, Rfl: 1   desvenlafaxine (PRISTIQ) 100 MG 24 hr tablet, TAKE 1 TABLET (100 MG) BY MOUTH DAILY, Disp: 90 tablet, Rfl: 3   doxycycline (VIBRAMYCIN) 100 MG capsule, Take 1 capsule (100 mg total) by mouth daily. One daily with dinner, Disp: 30 capsule, Rfl: 2   mupirocin ointment (BACTROBAN) 2 %, Apply 1 Application topically daily. Apply daily to inflamed areas, Disp: 22 g, Rfl: 1   hydrOXYzine (ATARAX) 25 MG tablet, Take 1 tablet (25 mg total) by mouth at bedtime as needed for itching or anxiety., Disp: 90 tablet, Rfl: 1   Review of Systems:   Review of Systems  Skin:  Positive for itching and rash.    Vitals:   Vitals:   10/24/22 1125  BP: 130/76  Pulse: 78  Temp: 97.7 F (36.5 C)  TempSrc: Temporal  SpO2: 96%  Weight: (!) 336 lb 6.1 oz (152.6 kg)  Height: 5\' 6"  (1.676 m)     Body mass index is 54.29 kg/m.  Physical Exam:   Physical  Exam Constitutional:      Appearance: Normal appearance. She is well-developed.  HENT:     Head: Normocephalic and atraumatic.  Eyes:     General: Lids are normal.     Extraocular Movements: Extraocular movements intact.     Conjunctiva/sclera: Conjunctivae normal.  Pulmonary:     Effort: Pulmonary effort is normal.  Musculoskeletal:        General: Normal range of motion.     Cervical back: Normal range of motion and neck supple.  Skin:    General: Skin is warm and dry.     Comments: Erythematous crusted lesions to left anterior chest wall and on lateral aspect of left neck; no lesions on ear  Neurological:     Mental Status: She is alert and oriented to person, place, and time.  Psychiatric:        Attention and Perception: Attention and perception normal.        Mood and Affect: Mood normal.        Behavior: Behavior normal.        Thought Content: Thought content normal.        Judgment: Judgment normal.      Assessment and Plan:   Rash and nonspecific skin eruption No red flags Suspect possible recent HSV  She is out of the treatment(s) window for this however will treat ongoing pruritus with xyzal and hydroxyzine at night If no improvement, will add oral prednisone two week course Follow-up if new/worsening/changes in symptom(s)    Jarold Motto, PA-C  Ladona Mow M Kadhim,acting as a scribe for Jarold Motto, PA.,have documented all relevant documentation on the behalf of Jarold Motto, PA,as directed by  Jarold Motto, PA while in the presence of Jarold Motto, [place].   I, Jarold Motto, [place], have reviewed all documentation for this visit. The documentation on 10/24/22 for the exam, diagnosis, procedures, and orders are all accurate and complete.

## 2022-10-24 NOTE — Patient Instructions (Signed)
It was great to see you!  Start xyzal daily  Take hydroxyzine at night  If no improvement, call me and I will send in steroids  Take care,  Jarold Motto PA-C

## 2022-10-26 ENCOUNTER — Telehealth: Payer: Self-pay | Admitting: Physician Assistant

## 2022-10-26 LAB — QUANTIFERON-TB GOLD PLUS
QuantiFERON Mitogen Value: >10 [IU]/mL
QuantiFERON Nil Value: 0.06 [IU]/mL
QuantiFERON TB1 Ag Value: 0.07 [IU]/mL
QuantiFERON TB2 Ag Value: 0.08 [IU]/mL
QuantiFERON-TB Gold Plus: NEGATIVE

## 2022-10-26 NOTE — Telephone Encounter (Signed)
Pt states the medicine that was prescribed to her did not work and was told by Savannah Roy if it didn't work to call back and get a Scientific laboratory technician. Please advise.

## 2022-10-27 ENCOUNTER — Other Ambulatory Visit: Payer: Self-pay | Admitting: Physician Assistant

## 2022-10-27 MED ORDER — PREDNISONE 10 MG PO TABS: ORAL_TABLET | ORAL | 0 refills | Status: DC

## 2022-10-27 NOTE — Telephone Encounter (Signed)
Left message on voicemail to call office.  

## 2022-10-27 NOTE — Telephone Encounter (Signed)
Left message on voicemail to call office.

## 2022-10-27 NOTE — Telephone Encounter (Signed)
Please see message and advise 

## 2022-10-27 NOTE — Telephone Encounter (Signed)
Pt called back told her per [facility] sent in steroids for her. If she notices any significant side effect(s) of this or behavioral change/mania -- she needs to let us know and STOP medication. Pt verbalized understanding.

## 2022-11-07 NOTE — Progress Notes (Signed)
Tb screening was negative.  Pt is ok to start biologic when decided.

## 2022-11-17 ENCOUNTER — Other Ambulatory Visit: Payer: Self-pay | Admitting: Physician Assistant

## 2022-11-17 DIAGNOSIS — Z1212 Encounter for screening for malignant neoplasm of rectum: Secondary | ICD-10-CM

## 2022-11-17 DIAGNOSIS — Z1211 Encounter for screening for malignant neoplasm of colon: Secondary | ICD-10-CM

## 2022-11-27 ENCOUNTER — Ambulatory Visit: Payer: Medicaid Other | Admitting: Dermatology

## 2022-12-02 LAB — COLOGUARD: COLOGUARD: NEGATIVE

## 2022-12-19 ENCOUNTER — Ambulatory Visit: Payer: Medicaid Other | Admitting: Dermatology

## 2022-12-19 ENCOUNTER — Encounter: Payer: Self-pay | Admitting: Dermatology

## 2022-12-19 VITALS — BP 120/77 | HR 98

## 2022-12-19 DIAGNOSIS — L723 Sebaceous cyst: Secondary | ICD-10-CM | POA: Diagnosis not present

## 2022-12-19 DIAGNOSIS — L732 Hidradenitis suppurativa: Secondary | ICD-10-CM

## 2022-12-19 DIAGNOSIS — L304 Erythema intertrigo: Secondary | ICD-10-CM

## 2022-12-19 MED ORDER — TRIAMCINOLONE ACETONIDE 40 MG/ML IJ SUSP
40.0000 mg | Freq: Once | INTRAMUSCULAR | Status: AC
Start: 2022-12-19 — End: 2022-12-19
  Administered 2022-12-19: 40 mg via INTRAMUSCULAR

## 2022-12-19 NOTE — Patient Instructions (Addendum)
Hello Christa,  Thank you for visiting my office today. Your dedication to managing your hidradenitis suppurativa is commendable, and I am here to support you in your journey towards better health. Below is a summary of the key instructions and recommendations from today's consultation:  - Cosentyx Treatment: Pending insurance approval, we plan to initiate treatment with Cosentyx injections. This medication will be administered using an auto-injector pen, which can be applied to the leg, belly, or arm.   We will teach you how to perform these injections at your next visit.  - Skin Care Products:   - CeraVe Benzoyl Peroxide Wash: Continue using this wash to manage odor associated with your condition.   - Mupirocin Ointment: Apply occasionally at night. This will help alleviate itching and aid in sleep.   - Zeasorb AF Powder: Recommended for managing intertrigo. This will help keep the affected area dry and control yeast growth.  - Follow-Up: We are scheduled to see you again in 6 weeks to assess your progress and potentially begin Cosentyx treatment, pending approval from your insurance.  Please adhere to the care instructions provided and do not hesitate to reach out with any questions or concerns before our next appointment.  Warm regards,  Dr. Langston Reusing Dermatology    Important Information   Due to recent changes in healthcare laws, you may see results of your pathology and/or laboratory studies on MyChart before the doctors have had a chance to review them. We understand that in some cases there may be results that are confusing or concerning to you. Please understand that not all results are received at the same time and often the doctors may need to interpret multiple results in order to provide you with the best plan of care or course of treatment. Therefore, we ask that you please give [place] 2 business days to thoroughly review all your results before contacting the office for  clarification. Should we see a critical lab result, you will be contacted sooner.     If You Need Anything After Your Visit   If you have any questions or concerns for your doctor, please call our main line at [phone]. If no one answers, please leave a voicemail as directed and we will return your call as soon as possible. Messages left after 4 pm will be answered the following business day.    You may also send [place] a message via MyChart. We typically respond to MyChart messages within 1-2 business days.  For prescription refills, please ask your pharmacy to contact our office. Our fax number is [phone].  If you have an urgent issue when the clinic is closed that cannot wait until the next business day, you can page your doctor at the number below.     Please note that while we do our best to be available for urgent issues outside of office hours, we are not available 24/7.    If you have an urgent issue and are unable to reach [place], you may choose to seek medical care at your doctor's office, retail clinic, urgent care center, or emergency room.   If you have a medical emergency, please immediately call 911 or go to the emergency department. In the event of inclement weather, please call our main line at [phone] for an update on the status of any delays or closures.  Dermatology Medication Tips: Please keep the boxes that topical medications come in in order to help keep track of the instructions about where and  how to use these. Pharmacies typically print the medication instructions only on the boxes and not directly on the medication tubes.   If your medication is too expensive, please contact our office at [phone] or send [place] a message through MyChart.    We are unable to tell what your co-pay for medications will be in advance as this is different depending on your insurance coverage. However, we may be able to find a substitute medication at lower cost or fill out  paperwork to get insurance to cover a needed medication.    If a prior authorization is required to get your medication covered by your insurance company, please allow [place] 1-2 business days to complete this process.   Drug prices often vary depending on where the prescription is filled and some pharmacies may offer cheaper prices.   The website www.goodrx.com contains coupons for medications through different pharmacies. The prices here do not account for what the cost may be with help from insurance (it may be cheaper with your insurance), but the website can give you the price if you did not use any insurance.  - You can print the associated coupon and take it with your prescription to the pharmacy.  - You may also stop by our office during regular business hours and pick up a GoodRx coupon card.  - If you need your prescription sent electronically to a different pharmacy, notify our office through [facility] or by phone at [phone]

## 2022-12-19 NOTE — Progress Notes (Signed)
Follow-Up Visit   Subjective  Savannah Roy is a 62 y.o. female who presents for the following: HS  Patient present today for follow up visit for HS. Patient was last evaluated on 10/19/22. At that visit, she was given her first ILK Injection. Patient reports the areas stayed clear for about 6 weeks then sxs returned. Patient reports sxs are  flared . Patient denies medication changes. She currently washes with Cerave BPO, and has oral doxycycline but hasn't taken it. She reports last week the flare appeared.  The following portions of the chart were reviewed this encounter and updated as appropriate: medications, allergies, medical history  Review of Systems:  No other skin or systemic complaints except as noted in HPI or Assessment and Plan.  Objective  Well appearing patient in no apparent distress; mood and affect are within normal limits.   A focused examination was performed of the following areas: Left Labium Majus, Right Labium Majus   Relevant exam findings are noted in the Assessment and Plan.  Left Labium Majus, Right Labium Majus Labia majora    Assessment & Plan   HIDRADENITIS SUPPURATIVA Exam:  Hurley Stage 3: multiple interconnected sinus tracts and abscesses throughout and entire area   Much improved  Hidradenitis Suppurativa is a chronic; persistent; non-curable, but treatable condition due to abnormal inflamed sweat glands in the body folds (axilla, inframammary, groin, medial thighs), causing recurrent painful draining cysts and scarring. It can be associated with severe scarring acne and cysts; also abscesses and scarring of scalp. The goal is control and prevention of flares, as it is not curable. Scars are permanent and can be thickened. Treatment may include daily use of topical medication and oral antibiotics.  Oral isotretinoin may also be helpful.  For some cases, Humira or Cosentyx (biologic injections) may be prescribed to decrease the inflammatory  process and prevent flares.  When indicated, inflamed cysts may also be treated surgically.  Treatment Plan: - Patient provided TB Results today while in office today. Results showed Negative for TB - Discussed when she is ready to contact our office to initiate Cosentyx -Continue using CeraVe benzoyl peroxide wash and mupirocin ointment as needed.   INTERTRIGO Exam: Erythematous macerated patches  Flared  Intertrigo is a chronic recurrent rash that occurs in skin fold areas that may be associated with friction; heat; moisture; yeast; fungus; and bacteria.  It is exacerbated by increased movement / activity; sweating; and higher atmospheric temperature.  Treatment Plan: - Recommended applying Zabsorb AF to apply daily     Inflamed sebaceous cyst (2) Left Labium Majus; Right Labium Majus  Procedure Note Intralesional Injection  Location: labia majora, mons pubis, left groin  Informed Consent: Discussed risks (infection, pain, bleeding, bruising, thinning of the skin, loss of skin pigment, lack of resolution, and recurrence of lesion) and benefits of the procedure, as well as the alternatives. Informed consent was obtained. Preparation: The area was prepared a standard fashion.  Anesthesia: None  Procedure Details: An intralesional injection was performed with Kenalog 40 mg/cc. 1 cc in total were injected. NDC #: 32440-102-72 Exp: 01/2023  Total number of injections: 5  Plan: The patient was instructed on post-op care. Recommend OTC analgesia as needed for pain.   triamcinolone acetonide (KENALOG-40) injection 40 mg - Left Labium Majus, Right Labium Majus   Related Medications doxycycline (VIBRAMYCIN) 100 MG capsule Take 1 capsule (100 mg total) by mouth daily. One daily with dinner  Hidradenitis suppurativa  Intertrigo  Return in about 6 weeks (around 01/30/2023) for HS F/U.    Documentation: I have reviewed the above documentation for accuracy and  completeness, and I agree with the above.  Stasia Cavalier, am acting as scribe for Langston Reusing, DO.  Langston Reusing, DO

## 2023-01-03 ENCOUNTER — Telehealth: Payer: Self-pay | Admitting: Physician Assistant

## 2023-01-03 MED ORDER — DESVENLAFAXINE SUCCINATE ER 100 MG PO TB24: ORAL_TABLET | ORAL | 1 refills | Status: DC

## 2023-01-03 NOTE — Telephone Encounter (Signed)
Prescription Request  01/03/2023  LOV: 10/24/2022  What is the name of the medication or equipment? desvenlafaxine (PRISTIQ) 100 MG 24 hr tablet   Have you contacted your pharmacy to request a refill? Yes   Which pharmacy would you like this sent to?  [id number] - [place], [place] - [address] [place] [address] [place] [address] Phone: [phone] Fax: [phone]    Patient notified that their request is being sent to the clinical staff for review and that they should receive a response within 2 business days.   Please advise at Mobile [phone] (mobile)

## 2023-01-03 NOTE — Telephone Encounter (Signed)
Rx for Pristiq 100 mg was [facility] to [facility] as requested.

## 2023-01-30 ENCOUNTER — Ambulatory Visit: Payer: Medicaid Other | Admitting: Dermatology

## 2023-03-26 ENCOUNTER — Encounter: Payer: Self-pay | Admitting: Dermatology

## 2023-03-26 MED ORDER — DOXYCYCLINE HYCLATE 100 MG PO CAPS: 100.0000 mg | ORAL_CAPSULE | Freq: Every day | ORAL | 0 refills | Status: AC

## 2023-03-26 NOTE — Telephone Encounter (Signed)
Yes, she can have a refill

## 2023-04-17 ENCOUNTER — Ambulatory Visit: Payer: Medicaid Other | Admitting: Dermatology

## 2023-05-08 ENCOUNTER — Encounter: Payer: Self-pay | Admitting: Dermatology

## 2023-07-03 ENCOUNTER — Telehealth: Payer: Self-pay | Admitting: Physician Assistant

## 2023-07-03 NOTE — Telephone Encounter (Signed)
 LVM to schedule an appt to complete ppw from [employer] & order supplies

## 2023-07-17 ENCOUNTER — Encounter: Payer: Self-pay | Admitting: Dermatology

## 2023-07-17 ENCOUNTER — Ambulatory Visit: Admitting: Dermatology

## 2023-07-17 DIAGNOSIS — L304 Erythema intertrigo: Secondary | ICD-10-CM | POA: Diagnosis not present

## 2023-07-17 DIAGNOSIS — L732 Hidradenitis suppurativa: Secondary | ICD-10-CM

## 2023-07-17 DIAGNOSIS — L723 Sebaceous cyst: Secondary | ICD-10-CM | POA: Diagnosis not present

## 2023-07-17 MED ORDER — TRIAMCINOLONE ACETONIDE 40 MG/ML IJ SUSP
40.0000 mg | Freq: Once | INTRAMUSCULAR | Status: AC
  Administered 2023-07-17: 40 mg via INTRAMUSCULAR

## 2023-07-17 MED ORDER — SPIRONOLACTONE 100 MG PO TABS: 100.0000 mg | ORAL_TABLET | Freq: Every day | ORAL | 11 refills | Status: AC

## 2023-07-17 NOTE — Progress Notes (Unsigned)
 Follow-Up Visit   Subjective  Savannah Roy is a 63 y.o. female who presents for the following: HS  Patient present today for follow up visit for HS. Patient was last evaluated on 12/19/22. At this visit patient had 2nd ILK Injections. Patient reports sxs are unchanged. Patient denies medication changes.  Patient states she is currently washing daily with BPO, oral Doxycyline, and topical Mupirocin  with very minimal relief.   The following portions of the chart were reviewed this encounter and updated as appropriate: medications, allergies, medical history  Review of Systems:  No other skin or systemic complaints except as noted in HPI or Assessment and Plan.  Objective  Well appearing patient in no apparent distress; mood and affect are within normal limits.  A focused examination was performed of the following areas: Labia Majoris, R Axilla  Relevant exam findings are noted in the Assessment and Plan.  Left Buttock, Left Labium Majus, Right Axilla, Right Buttock, Right Labium Majus   Assessment & Plan   HIDRADENITIS SUPPURATIVA Exam: Hurley Stage 3: multiple interconnected sinus tracts and abscesses throughout and entire area   Flared  Hidradenitis Suppurativa is a chronic; persistent; non-curable, but treatable condition due to abnormal inflamed sweat glands in the body folds (axilla, inframammary, groin, medial thighs), causing recurrent painful draining cysts and scarring. It can be associated with severe scarring acne and cysts; also abscesses and scarring of scalp. The goal is control and prevention of flares, as it is not curable. Scars are permanent and can be thickened. Treatment may include daily use of topical medication and oral antibiotics.  Oral isotretinoin may also be helpful.  For some cases, Humira or Cosentyx (biologic injections) may be prescribed to decrease the inflammatory process and prevent flares.  When indicated, inflamed cysts may also be treated  surgically.  Treatment Plan: - Prescribed Spironolactone 100 mg to take daily - Recommended applying Desitin to a the buttock and groin areas to prevent excessive irritating from rubbing - ILK Injections Completed while in office today  INTERTRIGO Exam: Erythematous macerated patches in body folds  Flared  Intertrigo is a chronic recurrent rash that occurs in skin fold areas that may be associated with friction; heat; moisture; yeast; fungus; and bacteria.  It is exacerbated by increased movement / activity; sweating; and higher atmospheric temperature.  Use of an absorbant powder such as Zeasorb AF powder or other OTC antifungal powder to the area daily can prevent rash recurrence. Other options to help keep the area dry include blow drying the area after bathing or using antiperspirant products such as Duradry sweat minimizing gel.  Treatment Plan: - Recommended continue Ketoconazole  2 times daily until areas are clear  HIDRADENITIS SUPPURATIVA   Related Medications spironolactone (ALDACTONE) 100 MG tablet Take 1 tablet (100 mg total) by mouth daily. INFLAMED SEBACEOUS CYST (5) Left Buttock, Left Labium Majus, Right Axilla, Right Buttock, Right Labium Majus Procedure Note Intralesional Injection  Location: labia majora, mons pubis, left groin, Right Axilla, B/L Buttock  Informed Consent: Discussed risks (infection, pain, bleeding, bruising, thinning of the skin, loss of skin pigment, lack of resolution, and recurrence of lesion) and benefits of the procedure, as well as the alternatives. Informed consent was obtained. Preparation: The area was prepared a standard fashion.  Anesthesia:None  Procedure Details: An intralesional injection was performed with Kenalog  40 mg/cc. 1 cc in total were injected. NDC #: 3086-5784-69 Exp: 05/26 LOT: [id number]  Total number of injections: 8  Plan: The patient was instructed on  post-op care. Recommend OTC analgesia as needed for  pain.  Related Medications triamcinolone  acetonide (KENALOG -40) injection 40 mg   Return in about 3 months (around 10/17/2023) for HS F/U.  I, Jetta Ager, am acting as Neurosurgeon for [employer], DO.  Documentation: I have reviewed the above documentation for accuracy and completeness, and I agree with the above.  Louana Roup, DO

## 2023-07-17 NOTE — Patient Instructions (Signed)

## 2023-09-17 ENCOUNTER — Other Ambulatory Visit (HOSPITAL_COMMUNITY): Payer: Self-pay

## 2023-09-17 ENCOUNTER — Ambulatory Visit: Admitting: Physician Assistant

## 2023-09-17 ENCOUNTER — Encounter: Payer: Self-pay | Admitting: Physician Assistant

## 2023-09-17 ENCOUNTER — Telehealth: Payer: Self-pay

## 2023-09-17 VITALS — BP 136/70 | HR 85 | Temp 97.5°F | Ht 66.0 in | Wt 327.4 lb

## 2023-09-17 DIAGNOSIS — E785 Hyperlipidemia, unspecified: Secondary | ICD-10-CM

## 2023-09-17 DIAGNOSIS — F5101 Primary insomnia: Secondary | ICD-10-CM | POA: Diagnosis not present

## 2023-09-17 DIAGNOSIS — I10 Essential (primary) hypertension: Secondary | ICD-10-CM

## 2023-09-17 DIAGNOSIS — R229 Localized swelling, mass and lump, unspecified: Secondary | ICD-10-CM

## 2023-09-17 DIAGNOSIS — E669 Obesity, unspecified: Secondary | ICD-10-CM | POA: Diagnosis not present

## 2023-09-17 DIAGNOSIS — F321 Major depressive disorder, single episode, moderate: Secondary | ICD-10-CM

## 2023-09-17 DIAGNOSIS — F332 Major depressive disorder, recurrent severe without psychotic features: Secondary | ICD-10-CM

## 2023-09-17 LAB — COMPREHENSIVE METABOLIC PANEL WITH GFR
ALT: 11 U/L (ref 0–35)
AST: 18 U/L (ref 0–37)
Albumin: 3.6 g/dL (ref 3.5–5.2)
Alkaline Phosphatase: 62 U/L (ref 39–117)
BUN: 17 mg/dL (ref 6–23)
CO2: 25 meq/L (ref 19–32)
Calcium: 8.5 mg/dL (ref 8.4–10.5)
Chloride: 107 meq/L (ref 96–112)
Creatinine, Ser: 0.96 mg/dL (ref 0.40–1.20)
GFR: 62.48 mL/min (ref >60.00)
Glucose, Bld: 111 mg/dL — ABNORMAL HIGH (ref 70–99)
Potassium: 3.8 meq/L (ref 3.5–5.1)
Sodium: 140 meq/L (ref 135–145)
Total Bilirubin: 1.8 mg/dL — ABNORMAL HIGH (ref 0.2–1.2)
Total Protein: 6.8 g/dL (ref 6.0–8.3)

## 2023-09-17 LAB — CBC WITH DIFFERENTIAL/PLATELET
Basophils Absolute: 0 K/uL (ref 0.0–0.1)
Basophils Relative: 0.3 % (ref 0.0–3.0)
Eosinophils Absolute: 0.1 K/uL (ref 0.0–0.7)
Eosinophils Relative: 2.9 % (ref 0.0–5.0)
HCT: 40.9 % (ref 36.0–46.0)
Hemoglobin: 13.5 g/dL (ref 12.0–15.0)
Lymphocytes Relative: 20.6 % (ref 12.0–46.0)
Lymphs Abs: 0.6 K/uL — ABNORMAL LOW (ref 0.7–4.0)
MCHC: 33 g/dL (ref 30.0–36.0)
MCV: 85.5 fl (ref 78.0–100.0)
Monocytes Absolute: 0.2 K/uL (ref 0.1–1.0)
Monocytes Relative: 6.1 % (ref 3.0–12.0)
Neutro Abs: 2.1 K/uL (ref 1.4–7.7)
Neutrophils Relative %: 70.1 % (ref 43.0–77.0)
Platelets: 141 K/uL — ABNORMAL LOW (ref 150.0–400.0)
RBC: 4.78 Mil/uL (ref 3.87–5.11)
RDW: 17.3 % — ABNORMAL HIGH (ref 11.5–15.5)
WBC: 3.1 K/uL — ABNORMAL LOW (ref 4.0–10.5)

## 2023-09-17 LAB — LIPID PANEL
Cholesterol: 148 mg/dL (ref 0–200)
HDL: 29.9 mg/dL — ABNORMAL LOW (ref >39.00)
LDL Cholesterol: 102 mg/dL — ABNORMAL HIGH (ref 0–99)
NonHDL: 117.6
Total CHOL/HDL Ratio: 5
Triglycerides: 77 mg/dL (ref 0.0–149.0)
VLDL: 15.4 mg/dL (ref 0.0–40.0)

## 2023-09-17 LAB — HEMOGLOBIN A1C: Hgb A1c MFr Bld: 5.2 % (ref 4.6–6.5)

## 2023-09-17 MED ORDER — WEGOVY 0.25 MG/0.5ML ~~LOC~~ SOAJ: 0.2500 mg | SUBCUTANEOUS | 2 refills | Status: DC

## 2023-09-17 NOTE — Telephone Encounter (Signed)
 Pharmacy Patient Advocate Encounter   Received notification from CoverMyMeds that prior authorization for Wegovy  0.25MG /0.5ML auto-injectors is required/requested.   Insurance verification completed.   The patient is insured through [facility] .   Per test claim: PA required; PA submitted to above mentioned insurance via CoverMyMeds Key/confirmation #/EOC  [id number] Status is pending

## 2023-09-17 NOTE — Patient Instructions (Signed)
 It was great to see you!   We will get an ultrasound of your back to assess the mass, which is what I suspect is a lipoma  Let's start Wegovy !  Follow up in 3 month(s) to follow up on this  Take care,  Jillien Yakel PA-C

## 2023-09-17 NOTE — Telephone Encounter (Signed)
 Pt was denied Wegovy , please see denial reason.

## 2023-09-17 NOTE — Telephone Encounter (Signed)
 Pharmacy Patient Advocate Encounter  Received notification from [facility] that Prior Authorization for Wegovy  0.25MG /0.5ML auto-injectors  has been DENIED.  Full denial letter will be uploaded to the media tab. See denial reason below.   PA #/Case ID/Reference #: [id number]

## 2023-09-17 NOTE — Progress Notes (Addendum)
 Savannah Roy is a 63 y.o. female here for a follow up of a pre-existing problem.  History of Present Illness:   Chief Complaint  Patient presents with   Medication Refill    Pt is needing refills for her medications.     Discussed the use of AI scribe software for clinical note transcription with the patient, who gave verbal consent to proceed.  History of Present Illness Savannah Roy is a 63 year old female with insomnia and hidradenitis suppurativa who presents with sleep disturbances and skin concerns.  She experiences ongoing insomnia, characterized by difficulty falling asleep and staying asleep. She often does not fall asleep until 3 to 5 AM and wakes up by 9 AM. Hydroxyzine  was tried for sleep but increased her alertness. Trazodone  was effective in the past, though she is unsure if it was due to concurrent medications. Recently, she has been using gummies recommended by her son, which occasionally help her sleep, especially when she needs to wake up early to transport family members.  She is responsible for transporting her father and stepmother, who have dementia and other health issues, to their medical appointments. This responsibility adds stress to her life, but she finds it beneficial to stay busy.  She has a history of hidradenitis suppurativa, which affects her quality of life. Currently on spironolactone , she reports that she does not have to take antibiotics as much. She receives injections in affected areas, including her armpits and near her buttocks, which have been beneficial. She experiences discomfort, especially in the area near her buttocks, described as 'burning' when sweating or draining.  She has noticed a mass on her back, described as a 'fat pooch' that has been enlarging. It does not cause pain, but she is concerned about its size and appearance.  She is currently taking Pristiq  100 mg daily but sometimes feels it may not be sufficient due to stress.    She is currently taking amlodipine  10 mg daily and doing well with her blood pressure.  She is currently walking regularly in efforts for weight loss. She has increased this as the weather cools, walking 3-5 days per week. She has reduced her caloric intake and avoid high fat, high sugar foods Her son is diabetic and she supports him by also following a diabetic diet She has intentionally lost 9 pounds since our last office visit and is interested in weight loss medication to further aid in weight loss as well as her mental health and hidradenitis suppurativa   Wt Readings from Last 4 Encounters:  09/17/23 (!) 327 lb 6.1 oz (148.5 kg)  10/24/22 (!) 336 lb 6.1 oz (152.6 kg)  05/09/22 (!) 348 lb (157.9 kg)  05/25/21 (!) 362 lb (164.2 kg)         Past Medical History:  Diagnosis Date   Anxiety    Depression    Hypertension    LVH (left ventricular hypertrophy) due to hypertensive disease 05/10/2016   Shortness of breath 04/07/2016   Sleep apnea      Social History   Tobacco Use   Smoking status: Former    Current packs/day: 0.00    Types: Cigarettes    Quit date: 2015    Years since quitting: 10.5   Smokeless tobacco: Never  Vaping Use   Vaping status: Never Used  Substance Use Topics   Alcohol use: Yes    Comment: occasionally   Drug use: No    Past Surgical History:  Procedure Laterality  Date   CHOLECYSTECTOMY N/A 12/17/2020   Procedure: LAPAROSCOPIC CHOLECYSTECTOMY;  Surgeon: Lyndel Deward PARAS, MD;  Location: [facility];  Service: General;  Laterality: N/A;   CYSTECTOMY Right    gum cyst removal   ENDOMETRIAL ABLATION W/ NOVASURE  2007   HERNIA REPAIR  1985   KNEE ARTHROSCOPY Right 2005    Family History  Problem Relation Age of Onset   Cancer Mother    Heart attack Mother    CVA Mother    Thyroid disease Mother    Kidney failure Father    Thyroid disease Sister    Heart failure Maternal Grandmother     Allergies  Allergen Reactions    Erythromycin     REACTION: rash    Current Medications:   Current Outpatient Medications:    amLODipine  (NORVASC ) 10 MG tablet, Take 1 tablet (10 mg total) by mouth daily., Disp: 90 tablet, Rfl: 1   atorvastatin  (LIPITOR) 80 MG tablet, Take 1 tablet (80 mg total) by mouth daily., Disp: 90 tablet, Rfl: 1   desvenlafaxine  (PRISTIQ ) 100 MG 24 hr tablet, TAKE 1 TABLET (100 MG) BY MOUTH DAILY, Disp: 90 tablet, Rfl: 1   doxycycline  (VIBRAMYCIN ) 100 MG capsule, Take 100 mg by mouth daily., Disp: , Rfl:    mupirocin  ointment (BACTROBAN ) 2 %, Apply 1 Application topically daily. Apply daily to inflamed areas, Disp: 22 g, Rfl: 1   Semaglutide -Weight Management (WEGOVY ) 0.25 MG/0.5ML SOAJ, Inject 0.25 mg into the skin once a week., Disp: 2 mL, Rfl: 2   spironolactone  (ALDACTONE ) 100 MG tablet, Take 1 tablet (100 mg total) by mouth daily., Disp: 30 tablet, Rfl: 11   Review of Systems:   Negative unless otherwise specified per HPI.  Vitals:   Vitals:   09/17/23 0806  BP: 136/70  Pulse: 85  Temp: (!) 97.5 F (36.4 C)  TempSrc: Temporal  SpO2: 96%  Weight: (!) 327 lb 6.1 oz (148.5 kg)  Height: 5' 6 (1.676 m)     Body mass index is 52.84 kg/m.  Physical Exam:   Physical Exam Vitals and nursing note reviewed.  Constitutional:      General: She is not in acute distress.    Appearance: She is well-developed. She is not ill-appearing or toxic-appearing.  Cardiovascular:     Rate and Rhythm: Normal rate and regular rhythm.     Pulses: Normal pulses.     Heart sounds: Normal heart sounds, S1 normal and S2 normal.  Pulmonary:     Effort: Pulmonary effort is normal.     Breath sounds: Normal breath sounds.  Skin:    General: Skin is warm and dry.     Comments: Approximately 10 cm mass to mid left thoracic spine; no tenderness to palpation   Neurological:     Mental Status: She is alert.     GCS: GCS eye subscore is 4. GCS verbal subscore is 5. GCS motor subscore is 6.  Psychiatric:         Speech: Speech normal.        Behavior: Behavior normal. Behavior is cooperative.     Assessment and Plan:   Assessment and Plan Assessment & Plan Insomnia Difficulty with sleep onset and maintenance. Hydroxyzine  increased alertness. Trazodone  effective previously and reconsidered. - Restart trazodone  for insomnia management.  Lipoma Enlarging mass on back suspected to be lipoma. Ultrasound recommended for confirmation. - Order ultrasound to confirm lipoma diagnosis. - Refer to general surgeon for evaluation and potential removal if confirmed.  Hidradenitis Suppurativa Improvement with spironolactone  and injections, reducing antibiotic need and symptoms. Discomfort persists in some areas. - Continue spironolactone  and injections as needed. - Follow up with dermatologist for ongoing management.  Obesity Interested in weight loss with Wegovy . Aware of potential side effects. Medicaid covers medication. Weight loss may benefit hidradenitis suppurativa. - Initiate Wegovy  for weight loss, starting with the lowest dose for three months. We started 0.25 mg weekly. - Educate on potential side effects and dietary considerations to minimize adverse effects. - Follow up in three months to assess progress and tolerance. Continue efforts at diet and exercise  Depression, major, single episode, moderate -well controlled on Pristiq  100 mg daily  Hypertension Blood pressure well-controlled with current medications. - Continue current antihypertensive medications of amlodipine  10 mg daily.       Lucie Buttner, PA-C

## 2023-09-18 ENCOUNTER — Ambulatory Visit: Admitting: Dermatology

## 2023-09-18 ENCOUNTER — Other Ambulatory Visit (HOSPITAL_COMMUNITY): Payer: Self-pay

## 2023-09-18 ENCOUNTER — Ambulatory Visit: Payer: Self-pay | Admitting: Physician Assistant

## 2023-09-18 VITALS — BP 135/70

## 2023-09-18 DIAGNOSIS — D171 Benign lipomatous neoplasm of skin and subcutaneous tissue of trunk: Secondary | ICD-10-CM

## 2023-09-18 DIAGNOSIS — R229 Localized swelling, mass and lump, unspecified: Secondary | ICD-10-CM | POA: Diagnosis not present

## 2023-09-18 DIAGNOSIS — L732 Hidradenitis suppurativa: Secondary | ICD-10-CM | POA: Diagnosis not present

## 2023-09-18 DIAGNOSIS — D72819 Decreased white blood cell count, unspecified: Secondary | ICD-10-CM

## 2023-09-18 NOTE — Telephone Encounter (Signed)
 Pharmacy Patient Advocate Encounter  Received notification from [facility] that Prior Authorization for Wegovy  0.25MG /0.5ML auto-injectors  has been APPROVED from 09/18/23 to 03/16/24   PA #/Case ID/Reference #: [id number]

## 2023-09-18 NOTE — Telephone Encounter (Signed)
 Per provider Please resubmit for Wegovy , note was incomplete at time of PA.

## 2023-09-18 NOTE — Patient Instructions (Signed)

## 2023-09-18 NOTE — Telephone Encounter (Signed)
 Left detailed message on personal voicemail, referral has been placed for Hematolgoy and someone will contact you to schedule an appt. Also your Rx for Wegovy  has been approved. Please contact pharmacy for pickup.

## 2023-09-18 NOTE — Telephone Encounter (Signed)
 Resubmitted with updated chart notes.  Key: [id number]

## 2023-09-18 NOTE — Progress Notes (Signed)
   Follow Up Visit   Subjective  Savannah Roy is a 63 y.o. female who presents for the following: Hidradenitis follow up - She is doing very well now. She does not have any flares today. She says she has taken doxycycline  once since her last appointment. She is taking spironolactone  100 mg daily and she is washing with BP wash.  She was at her PCP yesterday and asked about a spot on her back. She said it was a lipoma. She is scheduled for an ultrasound tomorrow.    The following portions of the chart were reviewed this encounter and updated as appropriate: medications, allergies, medical history  Review of Systems:  No other skin or systemic complaints except as noted in HPI or Assessment and Plan.  Objective  Well appearing patient in no apparent distress; mood and affect are within normal limits.  A focused examination was performed of the following areas: Back  Relevant exam findings are noted in the Assessment and Plan.    Assessment & Plan   HIDRADENITIS SUPPURATIVA = - Assessment: Patient reports improvement with spironolactone  treatment. Benzoyl peroxide wash is being used for maintenance. Patient experiences flares, which may be triggered by stress. Patient is starting Wegovy , which may help with acne management. - Plan:    Continue spironolactone  once daily    Continue benzoyl peroxide wash    For acne flares:     - Use mupirocin  topically     - Take doxycycline  hyclate    Follow up in 6 months  2. Suspected Lipoma - Assessment: Patient reports a lump that feels like a lipoma. Lipomas are benign collections of fat. An ultrasound is warranted to rule out vascular involvement. - Plan:    Order ultrasound to evaluate for vascular involvement    Discuss surgical options if removal is desired:     - Referral to plastic surgery or general surgery if removal is indicated     - Local anesthesia option available for office-based procedure     - General anesthesia option  available for operating room procedure    Patient education:     - Removal only necessary if painful or growing     - Post-surgical care:        Minimize movement to avoid keloid formation        Avoid pressure and tension on the area for first two months of healing    Surgeon to discuss procedure details and anesthesia options with patient     Return in about 6 months (around 03/20/2024) for HS follow up.  I, Roseline Hutchinson, CMA, am acting as scribe for [employer], DO .   Documentation: I have reviewed the above documentation for accuracy and completeness, and I agree with the above.  Delon Lenis, DO

## 2023-09-18 NOTE — Telephone Encounter (Signed)
 Referral has been placed.

## 2023-09-19 ENCOUNTER — Inpatient Hospital Stay
Admission: RE | Admit: 2023-09-19 | Discharge: 2023-09-19 | Discharge status: Home / Self Care | Source: Ambulatory Visit | Attending: Physician Assistant

## 2023-09-19 DIAGNOSIS — R229 Localized swelling, mass and lump, unspecified: Secondary | ICD-10-CM

## 2023-09-20 ENCOUNTER — Ambulatory Visit: Payer: Self-pay | Admitting: Physician Assistant

## 2023-09-20 ENCOUNTER — Other Ambulatory Visit (INDEPENDENT_AMBULATORY_CARE_PROVIDER_SITE_OTHER)

## 2023-09-20 DIAGNOSIS — D72819 Decreased white blood cell count, unspecified: Secondary | ICD-10-CM

## 2023-09-20 LAB — BASIC METABOLIC PANEL WITH GFR
BUN: 24 mg/dL — ABNORMAL HIGH (ref 6–23)
CO2: 24 meq/L (ref 19–32)
Calcium: 8.9 mg/dL (ref 8.4–10.5)
Chloride: 106 meq/L (ref 96–112)
Creatinine, Ser: 0.92 mg/dL (ref 0.40–1.20)
GFR: 65.75 mL/min (ref >60.00)
Glucose, Bld: 96 mg/dL (ref 70–99)
Potassium: 4.1 meq/L (ref 3.5–5.1)
Sodium: 140 meq/L (ref 135–145)

## 2023-09-20 LAB — IBC + FERRITIN
Ferritin: 30.1 ng/mL (ref 10.0–291.0)
Iron: 59 ug/dL (ref 42–145)
Saturation Ratios: 16.7 % — ABNORMAL LOW (ref 20.0–50.0)
TIBC: 354.2 ug/dL (ref 250.0–450.0)
Transferrin: 253 mg/dL (ref 212.0–360.0)

## 2023-09-20 LAB — CBC WITH DIFFERENTIAL/PLATELET
Basophils Absolute: 0 K/uL (ref 0.0–0.1)
Basophils Relative: 0.4 % (ref 0.0–3.0)
Eosinophils Absolute: 0.1 K/uL (ref 0.0–0.7)
Eosinophils Relative: 3 % (ref 0.0–5.0)
HCT: 41 % (ref 36.0–46.0)
Hemoglobin: 13.4 g/dL (ref 12.0–15.0)
Lymphocytes Relative: 27 % (ref 12.0–46.0)
Lymphs Abs: 0.8 K/uL (ref 0.7–4.0)
MCHC: 32.6 g/dL (ref 30.0–36.0)
MCV: 86.3 fl (ref 78.0–100.0)
Monocytes Absolute: 0.2 K/uL (ref 0.1–1.0)
Monocytes Relative: 5.6 % (ref 3.0–12.0)
Neutro Abs: 1.9 K/uL (ref 1.4–7.7)
Neutrophils Relative %: 64 % (ref 43.0–77.0)
Platelets: 156 K/uL (ref 150.0–400.0)
RBC: 4.75 Mil/uL (ref 3.87–5.11)
RDW: 18.1 % — ABNORMAL HIGH (ref 11.5–15.5)
WBC: 3 K/uL — ABNORMAL LOW (ref 4.0–10.5)

## 2023-09-20 LAB — HEPATIC FUNCTION PANEL
ALT: 13 U/L (ref 0–35)
AST: 21 U/L (ref 0–37)
Albumin: 3.7 g/dL (ref 3.5–5.2)
Alkaline Phosphatase: 74 U/L (ref 39–117)
Bilirubin, Direct: 0.2 mg/dL (ref 0.0–0.3)
Total Bilirubin: 1.2 mg/dL (ref 0.2–1.2)
Total Protein: 6.9 g/dL (ref 6.0–8.3)

## 2023-09-26 ENCOUNTER — Other Ambulatory Visit: Payer: Self-pay | Admitting: Physician Assistant

## 2023-09-26 DIAGNOSIS — R229 Localized swelling, mass and lump, unspecified: Secondary | ICD-10-CM

## 2023-09-26 NOTE — Telephone Encounter (Signed)
 Spoke with patient, relayed notes per PCP about CT W/Contrast Scan needed. Patient is agreeable.

## 2023-09-26 NOTE — Telephone Encounter (Signed)
-----   Message from [place] sent at 09/25/2023  4:14 PM EDT ----- Unfortunately we did not get enough information about her back mass with the ultrasound It is recommended to get a CT with contrast to further evaluate -- please let me know if patient is agreeable and I will order this ----- Message ----- From: Interface, Rad Results In Sent: 09/25/2023   1:17 PM EDT To: Lucie Buttner, PA

## 2023-09-27 ENCOUNTER — Encounter: Payer: Self-pay | Admitting: Physician Assistant

## 2023-10-01 ENCOUNTER — Ambulatory Visit
Admission: RE | Admit: 2023-10-01 | Discharge: 2023-10-01 | Disposition: A | Discharge status: Home / Self Care | Source: Ambulatory Visit | Attending: Physician Assistant | Admitting: Physician Assistant

## 2023-10-01 ENCOUNTER — Encounter: Payer: Self-pay | Admitting: Radiology

## 2023-10-01 DIAGNOSIS — R229 Localized swelling, mass and lump, unspecified: Secondary | ICD-10-CM

## 2023-10-01 MED ORDER — IOPAMIDOL (ISOVUE-300) INJECTION 61%
100.0000 mL | Freq: Once | INTRAVENOUS | Status: AC | PRN
  Administered 2023-10-01: 100 mL via INTRAVENOUS

## 2023-10-04 ENCOUNTER — Encounter (INDEPENDENT_AMBULATORY_CARE_PROVIDER_SITE_OTHER): Payer: Self-pay

## 2023-10-12 ENCOUNTER — Ambulatory Visit: Payer: Self-pay | Admitting: Physician Assistant

## 2023-10-12 DIAGNOSIS — R229 Localized swelling, mass and lump, unspecified: Secondary | ICD-10-CM

## 2023-10-23 ENCOUNTER — Inpatient Hospital Stay: Attending: Oncology | Admitting: Oncology

## 2023-10-23 ENCOUNTER — Encounter: Payer: Self-pay | Admitting: Oncology

## 2023-10-23 ENCOUNTER — Inpatient Hospital Stay

## 2023-10-23 VITALS — BP 151/73 | HR 93 | Temp 97.6°F | Resp 20 | Ht 66.0 in | Wt 324.8 lb

## 2023-10-23 DIAGNOSIS — D72819 Decreased white blood cell count, unspecified: Secondary | ICD-10-CM

## 2023-10-23 DIAGNOSIS — Z809 Family history of malignant neoplasm, unspecified: Secondary | ICD-10-CM | POA: Insufficient documentation

## 2023-10-23 DIAGNOSIS — Z79620 Long term (current) use of immunosuppressive biologic: Secondary | ICD-10-CM | POA: Insufficient documentation

## 2023-10-23 DIAGNOSIS — I10 Essential (primary) hypertension: Secondary | ICD-10-CM | POA: Insufficient documentation

## 2023-10-23 DIAGNOSIS — Z79899 Other long term (current) drug therapy: Secondary | ICD-10-CM | POA: Insufficient documentation

## 2023-10-23 DIAGNOSIS — R768 Other specified abnormal immunological findings in serum: Secondary | ICD-10-CM | POA: Diagnosis not present

## 2023-10-23 DIAGNOSIS — Z87891 Personal history of nicotine dependence: Secondary | ICD-10-CM | POA: Insufficient documentation

## 2023-10-23 DIAGNOSIS — G4733 Obstructive sleep apnea (adult) (pediatric): Secondary | ICD-10-CM | POA: Insufficient documentation

## 2023-10-23 DIAGNOSIS — E538 Deficiency of other specified B group vitamins: Secondary | ICD-10-CM | POA: Diagnosis not present

## 2023-10-23 DIAGNOSIS — L732 Hidradenitis suppurativa: Secondary | ICD-10-CM | POA: Insufficient documentation

## 2023-10-23 LAB — CBC WITH DIFFERENTIAL (CANCER CENTER ONLY)
Abs Immature Granulocytes: 0.01 K/uL (ref 0.00–0.07)
Basophils Absolute: 0 K/uL (ref 0.0–0.1)
Basophils Relative: 0 %
Eosinophils Absolute: 0.1 K/uL (ref 0.0–0.5)
Eosinophils Relative: 2 %
HCT: 40.1 % (ref 36.0–46.0)
Hemoglobin: 13.1 g/dL (ref 12.0–15.0)
Immature Granulocytes: 0 %
Lymphocytes Relative: 22 %
Lymphs Abs: 0.6 K/uL — ABNORMAL LOW (ref 0.7–4.0)
MCH: 28.9 pg (ref 26.0–34.0)
MCHC: 32.7 g/dL (ref 30.0–36.0)
MCV: 88.5 fL (ref 80.0–100.0)
Monocytes Absolute: 0.2 K/uL (ref 0.1–1.0)
Monocytes Relative: 6 %
Neutro Abs: 1.9 K/uL (ref 1.7–7.7)
Neutrophils Relative %: 70 %
Platelet Count: 117 K/uL — ABNORMAL LOW (ref 150–400)
RBC: 4.53 MIL/uL (ref 3.87–5.11)
RDW: 16 % — ABNORMAL HIGH (ref 11.5–15.5)
WBC Count: 2.7 K/uL — ABNORMAL LOW (ref 4.0–10.5)
nRBC: 0 % (ref 0.0–0.2)

## 2023-10-23 LAB — CMP (CANCER CENTER ONLY)
ALT: 14 U/L (ref 0–44)
AST: 26 U/L (ref 15–41)
Albumin: 3.7 g/dL (ref 3.5–5.0)
Alkaline Phosphatase: 72 U/L (ref 38–126)
Anion gap: 11 (ref 5–15)
BUN: 16 mg/dL (ref 8–23)
CO2: 25 mmol/L (ref 22–32)
Calcium: 9.5 mg/dL (ref 8.9–10.3)
Chloride: 105 mmol/L (ref 98–111)
Creatinine: 1.17 mg/dL — ABNORMAL HIGH (ref 0.44–1.00)
GFR, Estimated: 52 mL/min — ABNORMAL LOW (ref >60)
Glucose, Bld: 104 mg/dL — ABNORMAL HIGH (ref 70–99)
Potassium: 3.8 mmol/L (ref 3.5–5.1)
Sodium: 141 mmol/L (ref 135–145)
Total Bilirubin: 1.8 mg/dL — ABNORMAL HIGH (ref 0.0–1.2)
Total Protein: 7 g/dL (ref 6.5–8.1)

## 2023-10-23 LAB — LACTATE DEHYDROGENASE: LDH: 148 U/L (ref 98–192)

## 2023-10-23 LAB — FERRITIN: Ferritin: 49 ng/mL (ref 11–307)

## 2023-10-23 LAB — TSH: TSH: 5.36 u[IU]/mL — ABNORMAL HIGH (ref 0.350–4.500)

## 2023-10-23 LAB — IRON AND TIBC
Iron: 80 ug/dL (ref 28–170)
Saturation Ratios: 22 % (ref 10.4–31.8)
TIBC: 365 ug/dL (ref 250–450)
UIBC: 285 ug/dL

## 2023-10-23 LAB — VITAMIN B12: Vitamin B-12: <150 pg/mL — ABNORMAL LOW (ref 180–914)

## 2023-10-23 LAB — FOLATE: Folate: 8 ng/mL (ref >5.9)

## 2023-10-23 NOTE — Progress Notes (Signed)
 [facility]  HEMATOLOGY CLINIC CONSULTATION NOTE   PATIENT NAME: Savannah Roy   MR#: [id number] DOB: [birth year]  DATE OF SERVICE: 10/23/2023   REFERRING PROVIDER  Job Lukes, PA   Patient Care Team: Job Lukes, [place] as PCP - General (Physician Assistant)   REASON FOR CONSULTATION/ CHIEF COMPLAINT: Evaluation of leukopenia  ASSESSMENT & PLAN:  Savannah Roy is a 63 y.o. lady with a past medical history of hypertension, obstructive sleep apnea, hidradenitis suppurativa, anxiety/depression, was referred to our service for evaluation of leukopenia..    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 Present since at least 2018, with the lowest recorded value being 2700 in June 2022.   Other blood counts, including red cells and platelets, are normal, and the proportions of different white cells are appropriate. No evidence of compromised immunity, as there are no recurrent infections, fevers, chills, or night sweats.   Differential diagnosis includes vitamin deficiency, iron deficiency, autoimmune conditions, long viral infections, or medication effects. Given the timeline and stability, benign leukopenia is suspected. No concern for bone marrow disorders or cancer at this time.  Labs today showed leukopenia with white count of 2700, normal differential, ANC of 1900.  Hemoglobin normal at 13.1.  Platelet 170,000.  Creatinine 1.17, bilirubin 1.8, otherwise unremarkable CMP.  Iron studies within normal limits.  We did pursue additional workup with B12, folic acid, ANA, rheumatoid factor testing, flow cytometry of peripheral blood.  - Schedule a phone visit in two weeks to discuss test results.  - Plan follow-up visit in four months with repeat labs.   I reviewed lab results and outside records for this visit and discussed relevant results with the patient. Diagnosis, plan of care and treatment options were also  discussed in detail with the patient. Opportunity provided to ask questions and answers provided to her apparent satisfaction. Provided instructions to call our clinic with any problems, questions or concerns prior to return visit. I recommended to continue follow-up with PCP and sub-specialists. She verbalized understanding and agreed with the plan. No barriers to learning was detected.  Chinita Patten, MD  10/23/2023 2:26 PM  [facility] - A DEPT OF [facility] [address] [place] [address] Dept: [phone] Dept Fax: [phone]   HISTORY OF PRESENT ILLNESS:  Discussed the use of AI scribe software for clinical note transcription with the patient, who gave verbal consent to proceed.  History of Present Illness Savannah Roy is a 63 year old female who presents for hematology evaluation of low white blood cell count. She was referred by Dr. Lukes Early for evaluation of low white blood cell count.  Labs at her PCPs office on 09/17/2022 showed white count of 3100 with ANC of 2100.  Hemoglobin was 13.5, platelet 141,000.  Repeat labs on 09/20/2023 showed white count of 3000 with normal differential, ANC of 1900, hemoglobin 13.4, platelet count 156,000.  Iron studies showed mild iron deficiency as noted by decreased iron saturation of 16%.  Ferritin 13.  Given persistent leukopenia, referral was sent to us  for further evaluation.  On review of records, patient has had chronic mild leukopenia at least since 2018 with lowest count of 2700 in June 2022.  She has not experienced recurrent infections, fevers, chills, or night sweats. She occasionally feels cold, which resolves after a nap, occurring about three times in the last three months. Her weight  has been stable with no unintentional weight loss.  She has hidradenitis suppurativa with lesions on her bottom and one spot under her arm. She is under dermatological  care and occasionally takes antibiotics for this condition. No recurrent infections or systemic symptoms are reported.  She notes that scratching her arm leads to spots, which she attributes to thin skin, and inquires about potential supplements like collagen or biotin.  Her son was recently diagnosed with bladder cancer.   MEDICAL HISTORY Past Medical History:  Diagnosis Date   Anxiety    Depression    Hidradenitis suppurativa    Hypertension    LVH (left ventricular hypertrophy) due to hypertensive disease 05/10/2016   Shortness of breath 04/07/2016   Sleep apnea      SURGICAL HISTORY Past Surgical History:  Procedure Laterality Date   CHOLECYSTECTOMY N/A 12/17/2020   Procedure: LAPAROSCOPIC CHOLECYSTECTOMY;  Surgeon: Lyndel Deward PARAS, MD;  Location: [facility];  Service: General;  Laterality: N/A;   CYSTECTOMY Right    gum cyst removal   ENDOMETRIAL ABLATION W/ NOVASURE  2007   HERNIA REPAIR  1985   KNEE ARTHROSCOPY Right 2005     SOCIAL HISTORY: She reports that she quit smoking about 10 years ago. Her smoking use included cigarettes. She has never used smokeless tobacco. She reports current alcohol use. She reports that she does not use drugs. Social History   Socioeconomic History   Marital status: Divorced    Spouse name: Not on file   Number of children: Not on file   Years of education: Not on file   Highest education level: Not on file  Occupational History   Not on file  Tobacco Use   Smoking status: Former    Current packs/day: 0.00    Types: Cigarettes    Quit date: 2015    Years since quitting: 10.6   Smokeless tobacco: Never  Vaping Use   Vaping status: Never Used  Substance and Sexual Activity   Alcohol use: Yes    Comment: occasionally   Drug use: No   Sexual activity: Never  Other Topics Concern   Not on file  Social History Narrative   Divorced   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   Enjoys reading and  traveling   Social Drivers of Corporate investment banker Strain: Medium Risk (04/28/2020)   Overall Financial Resource Strain (CARDIA)    Difficulty of Paying Living Expenses: Somewhat hard  Food Insecurity: No Food Insecurity (10/23/2023)   Hunger Vital Sign    Worried About Running Out of Food in the Last Year: Never true    Ran Out of Food in the Last Year: Never true  Transportation Needs: No Transportation Needs (10/23/2023)   PRAPARE - Administrator, Civil Service (Medical): No    Lack of Transportation (Non-Medical): No  Physical Activity: Not on file  Stress: Not on file  Social Connections: Not on file  Intimate Partner Violence: Not At Risk (10/23/2023)   Humiliation, Afraid, Rape, and Kick questionnaire    Fear of Current or Ex-Partner: No    Emotionally Abused: No    Physically Abused: No    Sexually Abused: No    FAMILY HISTORY: Her family history includes CVA in her mother; Cancer in her mother; Heart attack in her mother; Heart failure in her maternal grandmother; Kidney failure in her father; Thyroid disease in her mother and sister.  CURRENT MEDICATIONS  Current Outpatient Medications  Medication Instructions   amLODipine  (NORVASC ) 10 mg, Oral, Daily   atorvastatin  (LIPITOR) 80 mg, Oral, Daily   desvenlafaxine  (PRISTIQ ) 100 MG 24 hr tablet TAKE 1 TABLET (100 MG) BY MOUTH DAILY   doxycycline  (VIBRAMYCIN ) 100 mg, Daily   mupirocin  ointment (BACTROBAN ) 2 % 1 Application, Topical, Daily, Apply daily to inflamed areas   spironolactone  (ALDACTONE ) 100 mg, Oral, Daily   Wegovy  0.25 mg, Subcutaneous, Weekly     ALLERGIES  She is allergic to erythromycin.  REVIEW OF SYSTEMS:  Review of Systems - Oncology   Rest of the pertinent review of systems is unremarkable except as mentioned above in HPI.  PHYSICAL EXAMINATION:   Onc Performance Status - 10/23/23 1354       ECOG Perf Status   ECOG Perf Status Fully active, able to carry on all pre-disease  performance without restriction      KPS SCALE   KPS % SCORE Normal, no compliants, no evidence of disease          Vitals:   10/23/23 1348  BP: (!) 151/73  Pulse: 93  Resp: 20  Temp: 97.6 F (36.4 C)  SpO2: 97%   Filed Weights   10/23/23 1348  Weight: (!) 324 lb 12.8 oz (147.3 kg)    Physical Exam Constitutional:      General: She is not in acute distress.    Appearance: Normal appearance.  HENT:     Head: Normocephalic and atraumatic.  Cardiovascular:     Rate and Rhythm: Normal rate.  Pulmonary:     Effort: Pulmonary effort is normal. No respiratory distress.  Abdominal:     General: There is no distension.  Lymphadenopathy:     Cervical: No cervical adenopathy.  Neurological:     General: No focal deficit present.     Mental Status: She is alert and oriented to person, place, and time.  Psychiatric:        Mood and Affect: Mood normal.        Behavior: Behavior normal.    LABORATORY DATA:   I have reviewed the data as listed.  Results for orders placed or performed in visit on 10/23/23  Rheumatoid factor  Result Value Ref Range   Rheumatoid fact SerPl-aCnc 11.5 <14.0 IU/mL  Lactate dehydrogenase  Result Value Ref Range   LDH 148 98 - 192 U/L  TSH  Result Value Ref Range   TSH 5.360 (H) 0.350 - 4.500 uIU/mL  ANA w/Reflex if Positive  Result Value Ref Range   Anti Nuclear Antibody (ANA) Positive (A) Negative  Folate  Result Value Ref Range   Folate 8.0 >5.9 ng/mL  Vitamin B12  Result Value Ref Range   Vitamin B-12 <150 (L) 180 - 914 pg/mL  Iron and TIBC  Result Value Ref Range   Iron 80 28 - 170 ug/dL   TIBC 634 749 - 549 ug/dL   Saturation Ratios 22 10.4 - 31.8 %   UIBC 285 ug/dL  Ferritin  Result Value Ref Range   Ferritin 49 11 - 307 ng/mL  CMP (Cancer Center only)  Result Value Ref Range   Sodium 141 135 - 145 mmol/L   Potassium 3.8 3.5 - 5.1 mmol/L   Chloride 105 98 - 111 mmol/L   CO2 25 22 - 32 mmol/L   Glucose, Bld 104 (H) 70  - 99 mg/dL   BUN 16 8 - 23 mg/dL   Creatinine 8.82 (H) 9.55 - 1.00 mg/dL  Calcium  9.5 8.9 - 10.3 mg/dL   Total Protein 7.0 6.5 - 8.1 g/dL   Albumin 3.7 3.5 - 5.0 g/dL   AST 26 15 - 41 U/L   ALT 14 0 - 44 U/L   Alkaline Phosphatase 72 38 - 126 U/L   Total Bilirubin 1.8 (H) 0.0 - 1.2 mg/dL   GFR, Estimated 52 (L) >60 mL/min   Anion gap 11 5 - 15  CBC with Differential (Cancer Center Only)  Result Value Ref Range   WBC Count 2.7 (L) 4.0 - 10.5 K/uL   RBC 4.53 3.87 - 5.11 MIL/uL   Hemoglobin 13.1 12.0 - 15.0 g/dL   HCT 59.8 63.9 - 53.9 %   MCV 88.5 80.0 - 100.0 fL   MCH 28.9 26.0 - 34.0 pg   MCHC 32.7 30.0 - 36.0 g/dL   RDW 83.9 (H) 88.4 - 84.4 %   Platelet Count 117 (L) 150 - 400 K/uL   nRBC 0.0 0.0 - 0.2 %   Neutrophils Relative % 70 %   Neutro Abs 1.9 1.7 - 7.7 K/uL   Lymphocytes Relative 22 %   Lymphs Abs 0.6 (L) 0.7 - 4.0 K/uL   Monocytes Relative 6 %   Monocytes Absolute 0.2 0.1 - 1.0 K/uL   Eosinophils Relative 2 %   Eosinophils Absolute 0.1 0.0 - 0.5 K/uL   Basophils Relative 0 %   Basophils Absolute 0.0 0.0 - 0.1 K/uL   Immature Granulocytes 0 %   Abs Immature Granulocytes 0.01 0.00 - 0.07 K/uL  ENA+DNA/DS+ANTICH+CENTRO+JO...  Result Value Ref Range   ds DNA Ab 10 (H) 0 - 9 IU/mL   Ribonucleic Protein <0.2 0.0 - 0.9 AI   ENA SM Ab Ser-aCnc 0.2 0.0 - 0.9 AI   Scleroderma (Scl-70) (ENA) Antibody, IgG <0.2 0.0 - 0.9 AI   SSA (Ro) (ENA) Antibody, IgG 0.3 0.0 - 0.9 AI   SSB (La) (ENA) Antibody, IgG <0.2 0.0 - 0.9 AI   Chromatin Ab SerPl-aCnc <0.2 0.0 - 0.9 AI   Anti JO-1 <0.2 0.0 - 0.9 AI   Centromere Ab Screen <0.2 0.0 - 0.9 AI   See below: Comment      RADIOGRAPHIC STUDIES:  I have personally reviewed the radiological images as listed and agreed with the findings in the report.  CT THORACIC SPINE W CONTRAST Result Date: 10/12/2023 EXAM: CT THORACIC SPINE WITH CONTRAST 10/01/2023 01:38:16 PM TECHNIQUE: CT of the thoracic spine was performed with the  administration of intravenous contrast. Multiplanar reformatted images are provided for review. Automated exposure control, iterative reconstruction, and/or weight based adjustment of the mA/kV was utilized to reduce the radiation dose to as low as reasonably achievable. COMPARISON: ultrasound 09/19/2023 CLINICAL HISTORY: Paraspinal mass/tumor, thoracic spine. Patient has lump to left posterior back, just to side of thoracic spine--marked with Vitamin E capsule. CT thoracic spine was recommended by radiologist on ultrasound abdomen. FINDINGS: BONES AND ALIGNMENT: Normal vertebral body heights. No acute fracture or suspicious bone lesion. Normal alignment. DEGENERATIVE CHANGES: Multilevel mid thoracic degenerative disc disease with flowing anterior osteophytes. No spinal canal stenosis. SOFT TISSUES: A Vitamin E capsule was placed to mark the site of the palpable lump. There is no soft tissue abnormality in the corresponding region. A poorly encapsulated lipoma may not be detectable on imaging. IMPRESSION: 1. No acute abnormality of the thoracic spine. 2. No soft tissue abnormality in the region marked by the vitamin E capsule. A poorly encapsulated lipoma may not be detectable on imaging.  Electronically signed by: Franky Stanford MD 10/12/2023 12:30 PM EDT RP Workstation: [id number]    Orders Placed This Encounter  Procedures   CBC with Differential (Cancer Center Only)    Standing Status:   Future    Expiration Date:   10/22/2024   CMP (Cancer Center only)    Standing Status:   Future    Expiration Date:   10/22/2024   Ferritin    Standing Status:   Future    Expiration Date:   10/22/2024   Iron and TIBC    Standing Status:   Future    Expiration Date:   10/22/2024   Vitamin B12    Standing Status:   Future    Expiration Date:   10/22/2024   Folate    Standing Status:   Future    Expiration Date:   10/22/2024   ANA w/Reflex if Positive    Standing Status:   Future    Expiration Date:   10/22/2024   TSH     Standing Status:   Future    Expiration Date:   10/22/2024   Lactate dehydrogenase    Standing Status:   Future    Expiration Date:   10/22/2024   Rheumatoid factor    Standing Status:   Future    Expiration Date:   10/22/2024    Future Appointments  Date Time Provider Department Center  11/06/2023  3:35 PM Autumn Millman, MD [facility] None  12/18/2023  1:00 PM Job Lukes, PA [facility] [place]  02/19/2024  2:15 PM [facility] PHLEBOTOMIST [facility] None  02/19/2024  2:45 PM Jakaylee Sasaki, Mill[place], MD [facility] None  03/18/2024  1:15 PM Alm Delon SAILOR, DO [facility] None    I spent a total of 40 minutes during this encounter with the patient including review of chart and various tests results, discussions about plan of care and coordination of care plan.  This document was completed utilizing speech recognition software. Grammatical errors, random word insertions, pronoun errors, and incomplete sentences are an occasional consequence of this system due to software limitations, ambient noise, and hardware issues. Any formal questions or concerns about the content, text or information contained within the body of this dictation should be directly addressed to the provider for clarification.

## 2023-10-24 LAB — ENA+DNA/DS+ANTICH+CENTRO+JO...
Anti JO-1: <0.2 AI (ref 0.0–0.9)
Centromere Ab Screen: <0.2 AI (ref 0.0–0.9)
Chromatin Ab SerPl-aCnc: <0.2 AI (ref 0.0–0.9)
ENA SM Ab Ser-aCnc: 0.2 AI (ref 0.0–0.9)
Ribonucleic Protein: <0.2 AI (ref 0.0–0.9)
SSA (Ro) (ENA) Antibody, IgG: 0.3 AI (ref 0.0–0.9)
SSB (La) (ENA) Antibody, IgG: <0.2 AI (ref 0.0–0.9)
Scleroderma (Scl-70) (ENA) Antibody, IgG: <0.2 AI (ref 0.0–0.9)
ds DNA Ab: 10 [IU]/mL — ABNORMAL HIGH (ref 0–9)

## 2023-10-24 LAB — ANA W/REFLEX IF POSITIVE: Anti Nuclear Antibody (ANA): POSITIVE — AB

## 2023-10-25 LAB — RHEUMATOID FACTOR: Rheumatoid fact SerPl-aCnc: 11.5 [IU]/mL (ref <14.0)

## 2023-10-29 ENCOUNTER — Encounter: Payer: Self-pay | Admitting: Oncology

## 2023-10-29 DIAGNOSIS — D72819 Decreased white blood cell count, unspecified: Secondary | ICD-10-CM | POA: Insufficient documentation

## 2023-10-29 NOTE — Assessment & Plan Note (Addendum)
 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 Present since at least 2018, with the lowest recorded value being 2700 in June 2022.   Other blood counts, including red cells and platelets, are normal, and the proportions of different white cells are appropriate. No evidence of compromised immunity, as there are no recurrent infections, fevers, chills, or night sweats.   Differential diagnosis includes vitamin deficiency, iron deficiency, autoimmune conditions, long viral infections, or medication effects. Given the timeline and stability, benign leukopenia is suspected. No concern for bone marrow disorders or cancer at this time.  Labs today showed leukopenia with white count of 2700, normal differential, ANC of 1900.  Hemoglobin normal at 13.1.  Platelet 170,000.  Creatinine 1.17, bilirubin 1.8, otherwise unremarkable CMP.  Iron studies within normal limits.  We did pursue additional workup with B12, folic acid, ANA, rheumatoid factor testing, flow cytometry of peripheral blood.  - Schedule a phone visit in two weeks to discuss test results.  - Plan follow-up visit in four months with repeat labs.

## 2023-11-06 ENCOUNTER — Encounter (HOSPITAL_BASED_OUTPATIENT_CLINIC_OR_DEPARTMENT_OTHER): Payer: Self-pay | Admitting: Student

## 2023-11-06 ENCOUNTER — Ambulatory Visit: Admitting: Family Medicine

## 2023-11-06 ENCOUNTER — Encounter: Payer: Self-pay | Admitting: Family Medicine

## 2023-11-06 ENCOUNTER — Ambulatory Visit (HOSPITAL_BASED_OUTPATIENT_CLINIC_OR_DEPARTMENT_OTHER): Admitting: Student

## 2023-11-06 ENCOUNTER — Inpatient Hospital Stay (HOSPITAL_BASED_OUTPATIENT_CLINIC_OR_DEPARTMENT_OTHER): Admitting: Oncology

## 2023-11-06 ENCOUNTER — Ambulatory Visit: Payer: Self-pay | Admitting: Family Medicine

## 2023-11-06 ENCOUNTER — Encounter: Payer: Self-pay | Admitting: Oncology

## 2023-11-06 ENCOUNTER — Ambulatory Visit: Payer: Self-pay

## 2023-11-06 ENCOUNTER — Ambulatory Visit (INDEPENDENT_AMBULATORY_CARE_PROVIDER_SITE_OTHER)

## 2023-11-06 VITALS — BP 138/78 | HR 67 | Temp 98.4°F | Ht 66.0 in | Wt 317.0 lb

## 2023-11-06 DIAGNOSIS — M25532 Pain in left wrist: Secondary | ICD-10-CM

## 2023-11-06 DIAGNOSIS — D72819 Decreased white blood cell count, unspecified: Secondary | ICD-10-CM | POA: Diagnosis not present

## 2023-11-06 DIAGNOSIS — E538 Deficiency of other specified B group vitamins: Secondary | ICD-10-CM | POA: Diagnosis not present

## 2023-11-06 DIAGNOSIS — I10 Essential (primary) hypertension: Secondary | ICD-10-CM | POA: Diagnosis not present

## 2023-11-06 DIAGNOSIS — E669 Obesity, unspecified: Secondary | ICD-10-CM

## 2023-11-06 NOTE — Assessment & Plan Note (Signed)
 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 Present since at least 2018, with the lowest recorded value being 2700 in June 2022.   Other blood counts, including red cells and platelets, are normal, and the proportions of different white cells are appropriate. No evidence of compromised immunity, as there are no recurrent infections, fevers, chills, or night sweats.   On her consultation with us  on 10/23/2023, labs showed leukopenia with white count of 2700, normal differential, ANC of 1900.  Hemoglobin normal at 13.1.  Platelet count was 117,000.  Creatinine 1.17, bilirubin 1.8, otherwise unremarkable CMP.  Iron studies, ferritin, folic acid, LDH were within normal limits.  Vitamin B12 was severely decreased at <150 pg/mL.  ANA screen was positive with reflex testing positive for anti-dsDNA antibody.  Rheumatoid factor was within normal limits.  Will start her on vitamin B12 injections, to be given weekly for 4 weeks followed by once every 4 weeks.  We will pursue testing for pernicious anemia on return visit. Consider endoscopy if pernicious anemia is confirmed.   - Plan follow-up visit in four months with repeat labs.

## 2023-11-06 NOTE — Telephone Encounter (Signed)
 Left detailed message informing patient of earlier appointments if she would like to come in sooner. Patient advised to give office a call.

## 2023-11-06 NOTE — Progress Notes (Signed)
 Chief Complaint: Left wrist injury    Discussed the use of AI scribe software for clinical note transcription with the patient, who gave verbal consent to proceed.  History of Present Illness Savannah Roy is a 63 year old female who presents with left wrist pain following a fall.  She fell from her deck, approximately three to five feet high, landing on her left side early this morning. She attempted to brace her fall with her left arm. She experienced significant pain and swelling in her left wrist, with pain radiating to her fingers. She is unable to make a full fist and has difficulty moving her fingers, especially the pinky. There is no numbness or tingling. X-rays were taken this morning at her PCP. For pain management, she has been using Tylenol  and ibuprofen and applying ice intermittently. She has difficulty sleeping due to the pain and swelling. She lives with her son, who is undergoing cancer treatment and surgery, and also cares for her parents, adding to her stress and responsibilities.   Surgical History:   None  PMH/PSH/Family History/Social History/Meds/Allergies:    Past Medical History:  Diagnosis Date   Anxiety    Depression    Hidradenitis suppurativa    Hypertension    LVH (left ventricular hypertrophy) due to hypertensive disease 05/10/2016   Shortness of breath 04/07/2016   Sleep apnea    Past Surgical History:  Procedure Laterality Date   CHOLECYSTECTOMY N/A 12/17/2020   Procedure: LAPAROSCOPIC CHOLECYSTECTOMY;  Surgeon: Lyndel Deward PARAS, MD;  Location: [facility];  Service: General;  Laterality: N/A;   CYSTECTOMY Right    gum cyst removal   ENDOMETRIAL ABLATION W/ NOVASURE  2007   HERNIA REPAIR  1985   KNEE ARTHROSCOPY Right 2005   Social History   Socioeconomic History   Marital status: Divorced    Spouse name: Not on file   Number of children: Not on file   Years of education: Not on file   Highest education  level: Not on file  Occupational History   Not on file  Tobacco Use   Smoking status: Former    Current packs/day: 0.00    Types: Cigarettes    Quit date: 2015    Years since quitting: 10.7   Smokeless tobacco: Never  Vaping Use   Vaping status: Never Used  Substance and Sexual Activity   Alcohol use: Yes    Comment: occasionally   Drug use: No   Sexual activity: Never  Other Topics Concern   Not on file  Social History Narrative   Divorced   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   Enjoys reading and traveling   Social Drivers of Corporate investment banker Strain: Medium Risk (04/28/2020)   Overall Financial Resource Strain (CARDIA)    Difficulty of Paying Living Expenses: Somewhat hard  Food Insecurity: No Food Insecurity (10/23/2023)   Hunger Vital Sign    Worried About Running Out of Food in the Last Year: Never true    Ran Out of Food in the Last Year: Never true  Transportation Needs: No Transportation Needs (10/23/2023)   PRAPARE - Administrator, Civil Service (Medical): No    Lack of Transportation (Non-Medical): No  Physical Activity: Not on file  Stress: Not on file  Social Connections: Not on file   Family History  Problem Relation Age of Onset   Cancer Mother    Heart attack Mother    CVA Mother    Thyroid disease Mother    Kidney failure Father    Thyroid disease Sister    Heart failure Maternal Grandmother    Allergies  Allergen Reactions   Erythromycin     REACTION: rash   Current Outpatient Medications  Medication Sig Dispense Refill   amLODipine  (NORVASC ) 10 MG tablet Take 1 tablet (10 mg total) by mouth daily. 90 tablet 1   atorvastatin  (LIPITOR) 80 MG tablet Take 1 tablet (80 mg total) by mouth daily. 90 tablet 1   desvenlafaxine  (PRISTIQ ) 100 MG 24 hr tablet TAKE 1 TABLET (100 MG) BY MOUTH DAILY 90 tablet 1   doxycycline  (VIBRAMYCIN ) 100 MG capsule Take 100 mg by mouth daily.     mupirocin  ointment  (BACTROBAN ) 2 % Apply 1 Application topically daily. Apply daily to inflamed areas 22 g 1   Semaglutide -Weight Management (WEGOVY ) 0.25 MG/0.5ML SOAJ Inject 0.25 mg into the skin once a week. 2 mL 2   spironolactone  (ALDACTONE ) 100 MG tablet Take 1 tablet (100 mg total) by mouth daily. 30 tablet 11   No current facility-administered medications for this visit.   DG Wrist Complete Left Result Date: 11/06/2023 CLINICAL DATA:  Wrist pain after falling last night. EXAM: LEFT WRIST - COMPLETE 3+ VIEW COMPARISON:  None Available. FINDINGS: The bones appear adequately mineralized. There is a small ossific density along the dorsal aspect of the distal radius which is best seen on the lateral view. This is indeterminate for a small acute fracture. No other evidence of acute fracture or dislocation. There are mild degenerative changes at the 1st carpometacarpal and scaphotrapeziotrapezoidal joints. Mild dorsal soft tissue swelling at the wrist without evidence of foreign body or soft tissue emphysema. IMPRESSION: 1. Small ossific density along the dorsal aspect of the distal radius, indeterminate for a small acute fracture. Correlate with point tenderness. 2. No other evidence of acute fracture or dislocation. Electronically Signed   By: Elsie Perone M.D.   On: 11/06/2023 11:55    Review of Systems:   A ROS was performed including pertinent positives and negatives as documented in the HPI.  Physical Exam :   Constitutional: NAD and appears stated age Neurological: Alert and oriented Psych: Appropriate affect and cooperative There were no vitals taken for this visit.   Comprehensive Musculoskeletal Exam:    Exam of the left wrist demonstrates presence of mild to moderate swelling without overlying erythema or warmth.  Active range of motion is limited to 20 degrees extension and 30 degrees flexion.  Tenderness and focal swelling more over the dorsal distal ulna.  Unable to form a composite fist.  Radial  pulse 2+.  Distal neurosensory exam intact.  Imaging:   Xray (left wrist 4 views): Small cortical irregularity seen on the dorsal distal radius indeterminate for acute fracture.  Otherwise no obvious fracture or dislocation is seen.   I personally reviewed and interpreted the radiographs.      Assessment & Plan Left wrist injury X-rays are inconclusive for a fracture, but cortical abnormalities and bone elevation suggest a possible occult fracture. Differential diagnosis includes a small nondisplaced fracture or soft tissue injury. Provide a wrist brace for immobilization and advise acetaminophen  and ibuprofen for pain management. Instruct her to apply ice intermittently and use the brace continuously, except  when showering. Schedule a follow-up in two weeks for reassessment and repeat x-rays.     I personally saw and evaluated the patient, and participated in the management and treatment plan.  Leonce Reveal, PA-C Orthopedics

## 2023-11-06 NOTE — Telephone Encounter (Signed)
 FYI Only or Action Required?: FYI only for provider. Scheduled for acute appointment today at 11 AM  Patient was last seen in primary care on 09/17/2023 by Job Lukes, PA.  Called Nurse Triage reporting Wrist Pain.  Symptoms began yesterday.  Interventions attempted: Rest, hydration, or home remedies.  Symptoms are: unchanged.  Triage Disposition: See Physician Within 24 Hours  Patient/caregiver understands and will follow disposition?: Yes  Copied from CRM #[id number]. Topic: Clinical - Red Word Triage >> Nov 06, 2023  8:30 AM Macario HERO wrote: Red Word that prompted transfer to Nurse Triage: Patient said she fell on her porch and may need an x-ray. Her wrist is swollen and it hurts. Reason for Disposition  [1] MODERATE pain (e.g., interferes with normal activities) AND [2] high-risk adult (e.g., age > 60 years, osteoporosis, chronic steroid use)  Answer Assessment - Initial Assessment Questions Patient scheduled for an acute appointment today at 7 AM-patient requested same day appointment.   1. MECHANISM: How did the injury happen?     Patient reports falling off her porch early this morning, tripping over a 24 pack of water.  2. ONSET: When did the injury happen? (e.g., minutes or hours ago)      Early this morning 3. APPEARANCE of INJURY: What does the injury look like?      Swollen left write 4. SEVERITY: Can you use your wrist normally? Can you move your wrist back and forth? Can you hold something in your hand?     Can move wrist back and forth. States she can make a fist lightly.  5. SIZE: For cuts, bruises, or swelling, ask: How large is it? (e.g., inches or centimeters; entire wrist)      Entire wrist is swollen 6. PAIN: How bad is the pain? (Scale 0-10; or none, mild, moderate, severe)     Moderate 7. TETANUS: For any breaks in the skin, ask: When was your last tetanus booster?     no 8. OTHER SYMPTOMS: Do you have any other symptoms?       no  Protocols used: Wrist Injury-A-AH

## 2023-11-06 NOTE — Telephone Encounter (Signed)
 Disregard message below, error. Patient scheduled for today with Dr. Katrinka.

## 2023-11-06 NOTE — Patient Instructions (Addendum)
 X-ray today  [facility] walk in [facility]  We encourage you to schedule online or to call [phone] to schedule a same-day appointment. If you choose to walk-in for care, please be aware our staff may be away from the office from 11:30 to 12:30 daily.  Recommended follow up: Return for as needed for new, worsening, persistent symptoms.

## 2023-11-06 NOTE — Progress Notes (Signed)
 [facility]  HEMATOLOGY-ONCOLOGY ELECTRONIC VISIT PROGRESS NOTE  PATIENT NAME: Savannah Roy   MR#: [id number] DOB: [birth year]  DATE OF SERVICE: 11/06/2023  Patient Care Team: Job Lukes, [place] as PCP - General (Physician Assistant)  I connected with the patient via telephone conference and verified that I am speaking with the correct person using two identifiers. The patient's location is at home and I am providing care from the [facility].  I discussed the limitations, risks, security and privacy concerns of performing an evaluation and management service by e-visits and the availability of in person appointments. I also discussed with the patient that there may be a patient responsible charge related to this service. The patient expressed understanding and agreed to proceed.   ASSESSMENT & PLAN:   Savannah Roy is a 63 y.o. lady with a past medical history of hypertension, obstructive sleep apnea, hidradenitis suppurativa, anxiety/depression, was referred to our service in September 2025 for evaluation of leukopenia..    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 Present since at least 2018, with the lowest recorded value being 2700 in June 2022.   Other blood counts, including red cells and platelets, are normal, and the proportions of different white cells are appropriate. No evidence of compromised immunity, as there are no recurrent infections, fevers, chills, or night sweats.   On her consultation with us  on 10/23/2023, labs showed leukopenia with white count of 2700, normal differential, ANC of 1900.  Hemoglobin normal at 13.1.  Platelet count was 117,000.  Creatinine 1.17, bilirubin 1.8, otherwise unremarkable CMP.  Iron studies, ferritin, folic acid, LDH were within normal limits.  Vitamin B12 was severely decreased at <150 pg/mL.  ANA screen was positive with reflex testing positive for anti-dsDNA antibody.  Rheumatoid  factor was within normal limits.  Will start her on vitamin B12 injections, to be given weekly for 4 weeks followed by once every 4 weeks.  We will pursue testing for pernicious anemia on return visit. Consider endoscopy if pernicious anemia is confirmed.   - Plan follow-up visit in four months with repeat labs.  Subclinical hypothyroidism Slightly elevated TSH indicating subclinical hypothyroidism, consistent with similar lab results from 2018, suggesting a chronic condition. No current thyroid medication. - Perform additional thyroid function tests (T3, T4) at next visit.  Positive antinuclear antibody (ANA) Positive ANA screen with weak positive results for lupus or connective tissue disorder. No family history of lupus. Current ANA results do not warrant rheumatology referral. - Recheck ANA at a later point to monitor changes.  I discussed the assessment and treatment plan with the patient. The patient was provided an opportunity to ask questions and all were answered. The patient agreed with the plan and demonstrated an understanding of the instructions. The patient was advised to call back or seek an in-person evaluation if the symptoms worsen or if the condition fails to improve as anticipated.    I spent 12 minutes over the phone with the patient reviewing test results, discuss management and coordination/planning of care.  Chinita Patten, MD 11/06/2023 5:37 PM [facility] - A DEPT OF [facility] [id number] [place] [address] Dept: [phone] Dept Fax: [phone]   INTERVAL HISTORY:  Please see above for problem oriented charting.  The purpose of today's discussion is to explain recent lab results and to formulate plan of care.  Discussed the use of AI  scribe software for clinical note transcription with the patient, who gave verbal consent to proceed.  History of Present Illness Savannah Roy is a 63 year old female who presents with low white blood cell and platelet counts.  She has chronic leukopenia with a white blood cell count of 2,700 and thrombocytopenia with a platelet count of 117,000. Her hemoglobin and red blood cell count are normal. Despite consuming meat, primarily chicken and hamburger, her vitamin B12 levels are very low. Iron labs and folic acid levels are normal.  There is a family history of thyroid issues, with her mother and sister both requiring thyroid medication. Her thyroid stimulating hormone (TSH) was slightly high, with similar lab results noted in 2018. She does not currently take thyroid medication.  A positive ANA test was noted, which is a screening for lupus or connective tissue disorders. Additional testing showed a very weak positive result. She has no known family history of lupus.   SUMMARY OF HEMATOLOGY HISTORY:  Labs at her PCPs office on 09/17/2022 showed white count of 3100 with ANC of 2100.  Hemoglobin was 13.5, platelet 141,000.  Repeat labs on 09/20/2023 showed white count of 3000 with normal differential, ANC of 1900, hemoglobin 13.4, platelet count 156,000.  Iron studies showed mild iron deficiency as noted by decreased iron saturation of 16%.  Ferritin 13.  Given persistent leukopenia, referral was sent to us  for further evaluation.   On review of records, patient has had chronic mild leukopenia at least since 2018 with lowest count of 2700 in June 2022.   She has not experienced recurrent infections, fevers, chills, or night sweats. She occasionally feels cold, which resolves after a nap, occurring about three times in the last three months. Her weight has been stable with no unintentional weight loss.   She has hidradenitis suppurativa with lesions on her bottom and one spot under her arm. She is under dermatological care and occasionally takes antibiotics for this condition. No recurrent infections or systemic symptoms are reported.    She notes that scratching her arm leads to spots, which she attributes to thin skin, and inquires about potential supplements like collagen or biotin.   Her son was recently diagnosed with bladder cancer.  On her consultation with us  on 10/23/2023, labs showed leukopenia with white count of 2700, normal differential, ANC of 1900.  Hemoglobin normal at 13.1.  Platelet count was 117,000.  Creatinine 1.17, bilirubin 1.8, otherwise unremarkable CMP.  Iron studies, ferritin, folic acid, LDH were within normal limits.  Vitamin B12 was severely decreased at <150 pg/mL.  ANA screen was positive with reflex testing positive for anti-dsDNA antibody.  Rheumatoid factor was within normal limits.  Will start her on vitamin B12 injections, to be given weekly for 4 weeks followed by once every 4 weeks.  We will pursue testing for pernicious anemia on return visit.   - Plan follow-up visit in four months with repeat labs.  REVIEW OF SYSTEMS:    Review of Systems - Oncology  All other pertinent systems were reviewed with the patient and are negative.  I have reviewed the past medical history, past surgical history, social history and family history with the patient and they are unchanged from previous note.  ALLERGIES:  She is allergic to erythromycin.  MEDICATIONS:  Current Outpatient Medications  Medication Sig Dispense Refill   amLODipine  (NORVASC ) 10 MG tablet Take 1 tablet (10 mg total) by mouth daily. 90 tablet 1   atorvastatin  (LIPITOR) 80  MG tablet Take 1 tablet (80 mg total) by mouth daily. 90 tablet 1   desvenlafaxine  (PRISTIQ ) 100 MG 24 hr tablet TAKE 1 TABLET (100 MG) BY MOUTH DAILY 90 tablet 1   doxycycline  (VIBRAMYCIN ) 100 MG capsule Take 100 mg by mouth daily.     mupirocin  ointment (BACTROBAN ) 2 % Apply 1 Application topically daily. Apply daily to inflamed areas 22 g 1   Semaglutide -Weight Management (WEGOVY ) 0.25 MG/0.5ML SOAJ Inject 0.25 mg into the skin once a week. 2 mL 2    spironolactone  (ALDACTONE ) 100 MG tablet Take 1 tablet (100 mg total) by mouth daily. 30 tablet 11   No current facility-administered medications for this visit.    PHYSICAL EXAMINATION:    Onc Performance Status - 11/06/23 1500       ECOG Perf Status   ECOG Perf Status Fully active, able to carry on all pre-disease performance without restriction      KPS SCALE   KPS % SCORE Normal, no compliants, no evidence of disease          LABORATORY DATA:   I have reviewed the data as listed.  Recent Results (from the past 2160 hours)  CBC with Differential/Platelet     Status: Abnormal   Collection Time: 09/17/23  8:31 AM  Result Value Ref Range   WBC 3.1 (L) 4.0 - 10.5 K/uL   RBC 4.78 3.87 - 5.11 Mil/uL   Hemoglobin 13.5 12.0 - 15.0 g/dL   HCT 59.0 63.9 - 53.9 %   MCV 85.5 78.0 - 100.0 fl   MCHC 33.0 30.0 - 36.0 g/dL   RDW 82.6 (H) 88.4 - 84.4 %   Platelets 141.0 (L) 150.0 - 400.0 K/uL   Neutrophils Relative % 70.1 43.0 - 77.0 %   Lymphocytes Relative 20.6 12.0 - 46.0 %   Monocytes Relative 6.1 3.0 - 12.0 %   Eosinophils Relative 2.9 0.0 - 5.0 %   Basophils Relative 0.3 0.0 - 3.0 %   Neutro Abs 2.1 1.4 - 7.7 K/uL   Lymphs Abs 0.6 (L) 0.7 - 4.0 K/uL   Monocytes Absolute 0.2 0.1 - 1.0 K/uL   Eosinophils Absolute 0.1 0.0 - 0.7 K/uL   Basophils Absolute 0.0 0.0 - 0.1 K/uL  Comprehensive metabolic panel with GFR     Status: Abnormal   Collection Time: 09/17/23  8:31 AM  Result Value Ref Range   Sodium 140 135 - 145 mEq/L   Potassium 3.8 3.5 - 5.1 mEq/L   Chloride 107 96 - 112 mEq/L   CO2 25 19 - 32 mEq/L   Glucose, Bld 111 (H) 70 - 99 mg/dL   BUN 17 6 - 23 mg/dL   Creatinine, Ser 9.03 0.40 - 1.20 mg/dL   Total Bilirubin 1.8 (H) 0.2 - 1.2 mg/dL   Alkaline Phosphatase 62 39 - 117 U/L   AST 18 0 - 37 U/L   ALT 11 0 - 35 U/L   Total Protein 6.8 6.0 - 8.3 g/dL   Albumin 3.6 3.5 - 5.2 g/dL   GFR 37.51 >39.99 mL/min    Comment: Calculated using the CKD-EPI Creatinine  Equation (2021)   Calcium  8.5 8.4 - 10.5 mg/dL  Lipid panel     Status: Abnormal   Collection Time: 09/17/23  8:31 AM  Result Value Ref Range   Cholesterol 148 0 - 200 mg/dL    Comment: ATP III Classification       Desirable:  < 200 mg/dL  Borderline High:  200 - 239 mg/dL          High:  > = 759 mg/dL   Triglycerides 22.9 0.0 - 149.0 mg/dL    Comment: Normal:  <849 mg/dLBorderline High:  150 - 199 mg/dL   HDL 70.09 (L) >60.99 mg/dL   VLDL 84.5 0.0 - 59.9 mg/dL   LDL Cholesterol 897 (H) 0 - 99 mg/dL   Total CHOL/HDL Ratio 5     Comment:                Men          Women1/2 Average Risk     3.4          3.3Average Risk          5.0          4.42X Average Risk          9.6          7.13X Average Risk          15.0          11.0                       NonHDL 117.60     Comment: NOTE:  Non-HDL goal should be 30 mg/dL higher than patient's LDL goal (i.e. LDL goal of < 70 mg/dL, would have non-HDL goal of < 100 mg/dL)  HgB J8r     Status: None   Collection Time: 09/17/23  8:31 AM  Result Value Ref Range   Hgb A1c MFr Bld 5.2 4.6 - 6.5 %    Comment: Glycemic Control Guidelines for People with Diabetes:Non Diabetic:  <6%Goal of Therapy: <7%Additional Action Suggested:  >8%   IBC + Ferritin     Status: Abnormal   Collection Time: 09/20/23  9:52 AM  Result Value Ref Range   Iron 59 42 - 145 ug/dL   Transferrin 746.9 787.9 - 360.0 mg/dL   Saturation Ratios 83.2 (L) 20.0 - 50.0 %   Ferritin 30.1 10.0 - 291.0 ng/mL   TIBC 354.2 250.0 - 450.0 mcg/dL  Basic metabolic panel with GFR     Status: Abnormal   Collection Time: 09/20/23  9:52 AM  Result Value Ref Range   Sodium 140 135 - 145 mEq/L   Potassium 4.1 3.5 - 5.1 mEq/L   Chloride 106 96 - 112 mEq/L   CO2 24 19 - 32 mEq/L   Glucose, Bld 96 70 - 99 mg/dL   BUN 24 (H) 6 - 23 mg/dL   Creatinine, Ser 9.07 0.40 - 1.20 mg/dL   GFR 34.24 >39.99 mL/min    Comment: Calculated using the CKD-EPI Creatinine Equation (2021)   Calcium  8.9  8.4 - 10.5 mg/dL  Hepatic function panel     Status: None   Collection Time: 09/20/23  9:52 AM  Result Value Ref Range   Total Bilirubin 1.2 0.2 - 1.2 mg/dL   Bilirubin, Direct 0.2 0.0 - 0.3 mg/dL   Alkaline Phosphatase 74 39 - 117 U/L   AST 21 0 - 37 U/L   ALT 13 0 - 35 U/L   Total Protein 6.9 6.0 - 8.3 g/dL   Albumin 3.7 3.5 - 5.2 g/dL  CBC with Differential/Platelet     Status: Abnormal   Collection Time: 09/20/23  9:52 AM  Result Value Ref Range   WBC 3.0 (L) 4.0 - 10.5 K/uL   RBC 4.75 3.87 - 5.11 Mil/uL  Hemoglobin 13.4 12.0 - 15.0 g/dL   HCT 58.9 63.9 - 53.9 %   MCV 86.3 78.0 - 100.0 fl   MCHC 32.6 30.0 - 36.0 g/dL   RDW 81.8 (H) 88.4 - 84.4 %   Platelets 156.0 150.0 - 400.0 K/uL   Neutrophils Relative % 64.0 43.0 - 77.0 %   Lymphocytes Relative 27.0 12.0 - 46.0 %   Monocytes Relative 5.6 3.0 - 12.0 %   Eosinophils Relative 3.0 0.0 - 5.0 %   Basophils Relative 0.4 0.0 - 3.0 %   Neutro Abs 1.9 1.4 - 7.7 K/uL   Lymphs Abs 0.8 0.7 - 4.0 K/uL   Monocytes Absolute 0.2 0.1 - 1.0 K/uL   Eosinophils Absolute 0.1 0.0 - 0.7 K/uL   Basophils Absolute 0.0 0.0 - 0.1 K/uL  Rheumatoid factor     Status: None   Collection Time: 10/23/23  2:40 PM  Result Value Ref Range   Rheumatoid fact SerPl-aCnc 11.5 <14.0 IU/mL    Comment: (NOTE) Performed At: [facility] [address] [place], [place] [id number] Jennette Shorter MD Ey:[id number]   Lactate dehydrogenase     Status: None   Collection Time: 10/23/23  2:40 PM  Result Value Ref Range   LDH 148 98 - 192 U/L    Comment: Performed at [facility], [address] [place], [place] [address]  TSH     Status: Abnormal   Collection Time: 10/23/23  2:40 PM  Result Value Ref Range   TSH 5.360 (H) 0.350 - 4.500 uIU/mL    Comment: Performed at [facility], [address] [place], [place] [address]  ANA w/Reflex if Positive     Status: Abnormal   Collection Time: 10/23/23  2:40 PM   Result Value Ref Range   Anti Nuclear Antibody (ANA) Positive (A) Negative    Comment: (NOTE) Performed At: [facility] [address] [place], [place] [id number] Jennette Shorter MD Ey:[id number]   Folate     Status: None   Collection Time: 10/23/23  2:40 PM  Result Value Ref Range   Folate 8.0 >5.9 ng/mL    Comment: Performed at [facility], [address] [place], [place] [address]  Vitamin B12     Status: Abnormal   Collection Time: 10/23/23  2:40 PM  Result Value Ref Range   Vitamin B-12 <150 (L) 180 - 914 pg/mL    Comment: (NOTE) This assay is not validated for testing neonatal or myeloproliferative syndrome specimens for Vitamin B12 levels. Performed at [facility], [address], [place], [place] [address]   Iron and TIBC     Status: None   Collection Time: 10/23/23  2:40 PM  Result Value Ref Range   Iron 80 28 - 170 ug/dL   TIBC 634 749 - 549 ug/dL   Saturation Ratios 22 10.4 - 31.8 %   UIBC 285 ug/dL    Comment: Performed at [facility], [address], [place], [place] [address]  Ferritin     Status: None   Collection Time: 10/23/23  2:40 PM  Result Value Ref Range   Ferritin 49 11 - 307 ng/mL    Comment: Performed at [facility], [address] [place], [place] [address]  CMP (Cancer Center only)     Status: Abnormal   Collection Time: 10/23/23  2:40 PM  Result Value Ref Range   Sodium 141 135 - 145 mmol/L   Potassium 3.8 3.5 -  5.1 mmol/L   Chloride 105 98 - 111 mmol/L   CO2 25 22 - 32 mmol/L   Glucose, Bld 104 (H) 70 - 99 mg/dL    Comment: Glucose reference range applies only to samples taken after fasting for at least 8 hours.   BUN 16 8 - 23 mg/dL   Creatinine 8.82 (H) 9.55 - 1.00 mg/dL   Calcium  9.5 8.9 - 10.3 mg/dL   Total Protein 7.0 6.5 - 8.1 g/dL   Albumin 3.7 3.5 - 5.0 g/dL   AST 26 15 - 41 U/L   ALT 14 0 - 44 U/L   Alkaline Phosphatase 72 38 - 126 U/L   Total Bilirubin 1.8 (H) 0.0 - 1.2  mg/dL   GFR, Estimated 52 (L) >60 mL/min    Comment: (NOTE) Calculated using the CKD-EPI Creatinine Equation (2021)    Anion gap 11 5 - 15    Comment: Performed at [facility], [address] [place], [place] [address]  CBC with Differential (Cancer Center Only)     Status: Abnormal   Collection Time: 10/23/23  2:40 PM  Result Value Ref Range   WBC Count 2.7 (L) 4.0 - 10.5 K/uL   RBC 4.53 3.87 - 5.11 MIL/uL   Hemoglobin 13.1 12.0 - 15.0 g/dL   HCT 59.8 63.9 - 53.9 %   MCV 88.5 80.0 - 100.0 fL   MCH 28.9 26.0 - 34.0 pg   MCHC 32.7 30.0 - 36.0 g/dL   RDW 83.9 (H) 88.4 - 84.4 %   Platelet Count 117 (L) 150 - 400 K/uL   nRBC 0.0 0.0 - 0.2 %   Neutrophils Relative % 70 %   Neutro Abs 1.9 1.7 - 7.7 K/uL   Lymphocytes Relative 22 %   Lymphs Abs 0.6 (L) 0.7 - 4.0 K/uL   Monocytes Relative 6 %   Monocytes Absolute 0.2 0.1 - 1.0 K/uL   Eosinophils Relative 2 %   Eosinophils Absolute 0.1 0.0 - 0.5 K/uL   Basophils Relative 0 %   Basophils Absolute 0.0 0.0 - 0.1 K/uL   Immature Granulocytes 0 %   Abs Immature Granulocytes 0.01 0.00 - 0.07 K/uL    Comment: Performed at [facility], [address], [place], [place] [address]  ENA+DNA/DS+ANTICH+CENTRO+JO...     Status: Abnormal   Collection Time: 10/23/23  2:40 PM  Result Value Ref Range   ds DNA Ab 10 (H) 0 - 9 IU/mL    Comment: (NOTE)                                   Negative      <5                                   Equivocal  5 - 9                                   Positive      >9    Ribonucleic Protein <0.2 0.0 - 0.9 AI   ENA SM Ab Ser-aCnc 0.2 0.0 - 0.9 AI   Scleroderma (Scl-70) (ENA) Antibody, IgG <0.2 0.0 - 0.9 AI   SSA (Ro) (ENA) Antibody, IgG 0.3 0.0 - 0.9 AI   SSB (La) (ENA) Antibody, IgG <  0.2 0.0 - 0.9 AI   Chromatin Ab SerPl-aCnc <0.2 0.0 - 0.9 AI   Anti JO-1 <0.2 0.0 - 0.9 AI   Centromere Ab Screen <0.2 0.0 - 0.9 AI   See below: Comment     Comment: (NOTE) Autoantibody                        Disease Association ------------------------------------------------------------                        Condition                  Frequency ---------------------   ------------------------   --------- Antinuclear Antibody,    SLE, mixed connective Direct (ANA-D)           tissue diseases ---------------------   ------------------------   --------- dsDNA                    SLE                        40 - 60% ---------------------   ------------------------   --------- Chromatin                Drug induced SLE                90%                         SLE                        48 - 97% ---------------------   ------------------------   --------- SSA (Ro)                 SLE                        25 - 35%                         Sjogren's Syndrome         40 - 70%                         Neonatal Lupus                 100% ---------------------   ------------------------   --------- SSB (La)                 SLE                              10%                         Sjogren's Syndrome              30% ---------------------   -----------------------    --------- Sm (anti-Smith)          SLE                        15 - 30% ---------------------   -----------------------    --------- RNP                      Mixed Connective Tissue  Disease                         95% (U1 nRNP,                SLE                        30 - 50% anti-ribonucleoprotein)  Polymyositis and/or                         Dermatomyositis                 20% ---------------------   ------------------------   --------- Scl-70 (antiDNA          Scleroderma (diffuse)      20 - 35% topoisomerase)           Crest                           13% ---------------------   ------------------------   --------- Jo-1                     Polymyositis and/or                         Dermatomyositis            20 - 40% ---------------------   ------------------------   --------- Centromere  B             Scleroderma -  Crest                         variant                         80% Performed At: [facility] [address] [place], [place] [id number] Jennette Shorter MD Ey:[id number]      RADIOGRAPHIC STUDIES:  I have personally reviewed the radiological images as listed and agree with the findings in the report.  DG Wrist Complete Left Result Date: 11/06/2023 CLINICAL DATA:  Wrist pain after falling last night. EXAM: LEFT WRIST - COMPLETE 3+ VIEW COMPARISON:  None Available. FINDINGS: The bones appear adequately mineralized. There is a small ossific density along the dorsal aspect of the distal radius which is best seen on the lateral view. This is indeterminate for a small acute fracture. No other evidence of acute fracture or dislocation. There are mild degenerative changes at the 1st carpometacarpal and scaphotrapeziotrapezoidal joints. Mild dorsal soft tissue swelling at the wrist without evidence of foreign body or soft tissue emphysema. IMPRESSION: 1. Small ossific density along the dorsal aspect of the distal radius, indeterminate for a small acute fracture. Correlate with point tenderness. 2. No other evidence of acute fracture or dislocation. Electronically Signed   By: Elsie Perone M.D.   On: 11/06/2023 11:55    Orders Placed This Encounter  Procedures   CBC with Differential (Cancer Center Only)    Standing Status:   Future    Expected Date:   02/22/2024    Expiration Date:   05/22/2024   CMP (Cancer Center only)    Standing Status:   Future    Expected Date:   02/22/2024    Expiration Date:   05/22/2024   Vitamin B12    Standing Status:   Future  Expected Date:   02/22/2024    Expiration Date:   05/22/2024   Methylmalonic acid, serum    Standing Status:   Future    Expected Date:   02/22/2024    Expiration Date:   05/22/2024   Anti-parietal antibody    Standing Status:   Future    Expected Date:   02/22/2024    Expiration Date:   05/22/2024   Intrinsic  Factor Antibodies    Standing Status:   Future    Expected Date:   02/22/2024    Expiration Date:   05/22/2024   TSH    Standing Status:   Future    Expected Date:   02/22/2024    Expiration Date:   05/22/2024   T4, free    Standing Status:   Future    Expected Date:   02/22/2024    Expiration Date:   05/22/2024   T3    Standing Status:   Future    Expected Date:   02/22/2024    Expiration Date:   05/22/2024     Future Appointments  Date Time Provider Department Center  12/18/2023  1:00 PM Job Lukes, [place] [facility]  02/19/2024  2:15 PM [facility] PHLEBOTOMIST [facility] None  02/19/2024  2:45 PM Kaedynce Tapp, [place], MD [facility] None  03/18/2024  1:15 PM Alm Delon SAILOR, DO [facility] None    This document was completed utilizing speech recognition software. Grammatical errors, random word insertions, pronoun errors, and incomplete sentences are an occasional consequence of this system due to software limitations, ambient noise, and hardware issues. Any formal questions or concerns about the content, text or information contained within the body of this dictation should be directly addressed to the provider for clarification.

## 2023-11-06 NOTE — Progress Notes (Signed)
 Phone [phone] In person visit   Subjective:   Savannah Roy is a 63 y.o. year old very pleasant female patient who presents for/with See problem oriented charting Chief Complaint  Patient presents with   Arm Injury    Clemens about 3 this morning on deck and landed on L side, now L wrist and swollen and painful; here for xray   Past Medical History-  Patient Active Problem List   Diagnosis Date Noted   Leukopenia 10/29/2023   Hidradenitis suppurativa 06/27/2022   Obesity 10/01/2017   Severe episode of recurrent major depressive disorder, without psychotic features (HCC) 11/21/2016   LVH (left ventricular hypertrophy) due to hypertensive disease 05/10/2016   SEBACEOUS CYST, BREAST 08/14/2007   Essential hypertension 05/16/2006   OLIGOMENORRHEA 05/16/2006    Medications- reviewed and updated Current Outpatient Medications  Medication Sig Dispense Refill   amLODipine  (NORVASC ) 10 MG tablet Take 1 tablet (10 mg total) by mouth daily. 90 tablet 1   atorvastatin  (LIPITOR) 80 MG tablet Take 1 tablet (80 mg total) by mouth daily. 90 tablet 1   desvenlafaxine  (PRISTIQ ) 100 MG 24 hr tablet TAKE 1 TABLET (100 MG) BY MOUTH DAILY 90 tablet 1   doxycycline  (VIBRAMYCIN ) 100 MG capsule Take 100 mg by mouth daily.     mupirocin  ointment (BACTROBAN ) 2 % Apply 1 Application topically daily. Apply daily to inflamed areas 22 g 1   Semaglutide -Weight Management (WEGOVY ) 0.25 MG/0.5ML SOAJ Inject 0.25 mg into the skin once a week. 2 mL 2   spironolactone  (ALDACTONE ) 100 MG tablet Take 1 tablet (100 mg total) by mouth daily. 30 tablet 11   No current facility-administered medications for this visit.     Objective:  BP 138/78   Pulse 67   Temp 98.4 F (36.9 C)   Ht 5' 6 (1.676 m)   Wt (!) 317 lb (143.8 kg)   SpO2 95%   BMI 51.17 kg/m  Gen: NAD, resting comfortably CV: RRR no murmurs rubs or gallops Lungs: CTAB no crackles, wheeze, rhonchi Skin: warm, dry Edematous left wrist and  into forearm- very tender to touch at wrist and into distal forearm- most pain at the wrist though. Limited grip strength due to pain and poor extension due to pain. Can rotate wrist with less severe pain though we did not resist her on this movement     Assessment and Plan   # Left wrist pain S: Patient fell around 3 AM this morning on her deck landed on her left side. She reports is a night owl and sleeps during day. Went out on deck to get water her son left out there but didn't turn light on thinking she knew terrain- had left a hoe out and tripped over it off the porch about 3 feet high - hit left side of head on grass- put left arm out to catch herself and noted pain after got out of shower (to clean herself up). No headache(s) or blurry vision.   Hurts proximal to wrist. Difficult to make fist or extend fingers. Can rotate wrist decently well. She has noted swelling in the wrist and distal forearm.  A/P: Suspect high probability of wrist fracture or distal forearm fracture with mechanism of injury-urgent x-ray today and she is going to go immediately to [facility] who has walk-in hours/same-day appointments -Fall was purely mechanical walking with the lights out-no other workup needed from this perspective encouraged her to walk in the situation like that on  the porch with the lights out  #hypertension S: medication: Amlodipine  10 mg BP Readings from Last 3 Encounters:  11/06/23 138/78  10/23/23 (!) 151/73  09/18/23 135/70  A/P: Blood pressure is high acceptable-continue current medication  # Obesity S: Wegovy  0.25 mg Wt Readings from Last 3 Encounters:  11/06/23 (!) 317 lb (143.8 kg)  10/23/23 (!) 324 lb 12.8 oz (147.3 kg)  09/17/23 (!) 327 lb 6.1 oz (148.5 kg)   A/P: Patient reports already has substantial weight loss on low-dose Wegovy -I congratulated her on her progress and she plans to continue current medication  Recommended follow up: Return for as needed for  new, worsening, persistent symptoms. Future Appointments  Date Time Provider Department Center  11/06/2023  3:35 PM Autumn Millman, MD [facility] None  12/18/2023  1:00 PM Job Lukes, PA [facility] [place]  02/19/2024  2:15 PM [facility] PHLEBOTOMIST [facility] None  02/19/2024  2:45 PM Pasam, Millman, MD [facility] None  03/18/2024  1:15 PM Alm Delon SAILOR, DO [facility] None    Lab/Order associations:   ICD-10-CM   1. Left wrist pain  M25.532 DG Wrist Complete Left      No orders of the defined types were placed in this encounter.   Return precautions advised.  Garnette Lukes, MD

## 2023-11-07 ENCOUNTER — Telehealth: Payer: Self-pay | Admitting: Nutrition

## 2023-11-07 NOTE — Telephone Encounter (Signed)
 Left PT a detailed message with appt details.

## 2023-11-08 ENCOUNTER — Telehealth: Payer: Self-pay | Admitting: Oncology

## 2023-11-08 ENCOUNTER — Inpatient Hospital Stay

## 2023-11-08 NOTE — Telephone Encounter (Signed)
 Spoke with PT to reschedule injection and to give PT all appt dates and time. Added missed appt day and time to the end of October. Day and time confirmed with Pt.

## 2023-11-21 ENCOUNTER — Telehealth: Payer: Self-pay | Admitting: Physician Assistant

## 2023-11-21 NOTE — Telephone Encounter (Signed)
 [facility] faxed Surgical Clearance, to be filled out by provider. Patient requested to send it back via Fax within ASAP. Document is located in providers tray at front office.Please advise at [phone].  Pt scheduled for OV on 11/28/23 @ 9:40 am. Form placed in provider's box.

## 2023-11-22 ENCOUNTER — Inpatient Hospital Stay: Attending: Oncology

## 2023-11-22 VITALS — BP 158/82 | HR 90 | Temp 98.2°F | Resp 18

## 2023-11-22 DIAGNOSIS — E538 Deficiency of other specified B group vitamins: Secondary | ICD-10-CM | POA: Diagnosis present

## 2023-11-22 MED ORDER — CYANOCOBALAMIN 1000 MCG/ML IJ SOLN
1000.0000 ug | Freq: Once | INTRAMUSCULAR | Status: AC
  Administered 2023-11-22: 1000 ug via INTRAMUSCULAR
  Filled 2023-11-22: qty 1

## 2023-11-22 NOTE — Patient Instructions (Signed)
 Vitamin B12 Injection What is this medication? Vitamin B12 (VAHY tuh min B12) prevents and treats low vitamin B12 levels in your body. It is used in people who do not get enough vitamin B12 from their diet or when their digestive tract does not absorb enough. Vitamin B12 plays an important role in maintaining the health of your nervous system and red blood cells. This medicine may be used for other purposes; ask your health care provider or pharmacist if you have questions. COMMON BRAND NAME(S): B-12 Compliance Kit, B-12 Injection Kit, Cyomin, Dodex , LA-12, Nutri-Twelve, Physicians EZ Use B-12, Primabalt, Vitamin Deficiency Injectable System - B12 What should I tell my care team before I take this medication? They need to know if you have any of these conditions: Kidney disease Leber's disease Megaloblastic anemia An unusual or allergic reaction to cyanocobalamin , cobalt, other medications, foods, dyes, or preservatives Pregnant or trying to get pregnant Breast-feeding How should I use this medication? This medication is injected into a muscle or deeply under the skin. It is usually given in a clinic or care team's office. However, your care team may teach you how to inject yourself. Follow all instructions. Talk to your care team about the use of this medication in children. Special care may be needed. Overdosage: If you think you have taken too much of this medicine contact a poison control center or emergency room at once. NOTE: This medicine is only for you. Do not share this medicine with others. What if I miss a dose? If you are given your dose at a clinic or care team's office, call to reschedule your appointment. If you give your own injections, and you miss a dose, take it as soon as you can. If it is almost time for your next dose, take only that dose. Do not take double or extra doses. What may interact with this medication? Alcohol Colchicine This list may not describe all possible  interactions. Give your health care provider a list of all the medicines, herbs, non-prescription drugs, or dietary supplements you use. Also tell them if you smoke, drink alcohol, or use illegal drugs. Some items may interact with your medicine. What should I watch for while using this medication? Visit your care team regularly. You may need blood work done while you are taking this medication. You may need to follow a special diet. Talk to your care team. Limit your alcohol intake and avoid smoking to get the best benefit. What side effects may I notice from receiving this medication? Side effects that you should report to your care team as soon as possible: Allergic reactions--skin rash, itching, hives, swelling of the face, lips, tongue, or throat Swelling of the ankles, hands, or feet Trouble breathing Side effects that usually do not require medical attention (report to your care team if they continue or are bothersome): Diarrhea This list may not describe all possible side effects. Call your doctor for medical advice about side effects. You may report side effects to FDA at 1-800-FDA-1088. Where should I keep my medication? Keep out of the reach of children. Store at room temperature between 15 and 30 degrees C (59 and 85 degrees F). Protect from light. Throw away any unused medication after the expiration date. NOTE: This sheet is a summary. It may not cover all possible information. If you have questions about this medicine, talk to your doctor, pharmacist, or health care provider.  2024 Elsevier/Gold Standard (2020-10-12 00:00:00)

## 2023-11-28 ENCOUNTER — Ambulatory Visit: Admitting: Physician Assistant

## 2023-11-29 ENCOUNTER — Ambulatory Visit (HOSPITAL_BASED_OUTPATIENT_CLINIC_OR_DEPARTMENT_OTHER): Admitting: Student

## 2023-11-29 ENCOUNTER — Ambulatory Visit (INDEPENDENT_AMBULATORY_CARE_PROVIDER_SITE_OTHER)

## 2023-11-29 ENCOUNTER — Inpatient Hospital Stay (HOSPITAL_BASED_OUTPATIENT_CLINIC_OR_DEPARTMENT_OTHER)

## 2023-11-29 VITALS — BP 145/76 | HR 98 | Temp 98.2°F | Resp 18

## 2023-11-29 DIAGNOSIS — M25532 Pain in left wrist: Secondary | ICD-10-CM

## 2023-11-29 DIAGNOSIS — S52502A Unspecified fracture of the lower end of left radius, initial encounter for closed fracture: Secondary | ICD-10-CM | POA: Diagnosis not present

## 2023-11-29 DIAGNOSIS — E538 Deficiency of other specified B group vitamins: Secondary | ICD-10-CM | POA: Diagnosis not present

## 2023-11-29 MED ORDER — CYANOCOBALAMIN 1000 MCG/ML IJ SOLN
1000.0000 ug | Freq: Once | INTRAMUSCULAR | Status: AC
  Administered 2023-11-29: 1000 ug via INTRAMUSCULAR
  Filled 2023-11-29: qty 1

## 2023-11-29 NOTE — Progress Notes (Signed)
 Chief Complaint: Left wrist injury     History of Present Illness  11/29/23: Patient presents today 3 weeks status post FOOSH injury of the left wrist.  Today she reports that her wrist is feeling much better and she has been able to wean out of the brace some however she does use it for activities such as driving due to some discomfort with wrist rotation.  Swelling and bruising have improved significantly.  She is having some discomfort more in the ulnar side of the wrist/hand area.   11/06/23: Savannah Roy is a 63 year old female who presents with left wrist pain following a fall. She fell from her deck, approximately three to five feet high, landing on her left side early this morning. She attempted to brace her fall with her left arm. She experienced significant pain and swelling in her left wrist, with pain radiating to her fingers. She is unable to make a full fist and has difficulty moving her fingers, especially the pinky. There is no numbness or tingling. X-rays were taken this morning at her PCP. For pain management, she has been using Tylenol  and ibuprofen and applying ice intermittently. She has difficulty sleeping due to the pain and swelling. She lives with her son, who is undergoing cancer treatment and surgery, and also cares for her parents, adding to her stress and responsibilities.   Surgical History:   None  PMH/PSH/Family History/Social History/Meds/Allergies:    Past Medical History:  Diagnosis Date   Anxiety    Depression    Hidradenitis suppurativa    Hypertension    LVH (left ventricular hypertrophy) due to hypertensive disease 05/10/2016   Shortness of breath 04/07/2016   Sleep apnea    Past Surgical History:  Procedure Laterality Date   CHOLECYSTECTOMY N/A 12/17/2020   Procedure: LAPAROSCOPIC CHOLECYSTECTOMY;  Surgeon: Lyndel Deward PARAS, MD;  Location: [facility];  Service: General;  Laterality: N/A;   CYSTECTOMY Right     gum cyst removal   ENDOMETRIAL ABLATION W/ NOVASURE  2007   HERNIA REPAIR  1985   KNEE ARTHROSCOPY Right 2005   Social History   Socioeconomic History   Marital status: Divorced    Spouse name: Not on file   Number of children: Not on file   Years of education: Not on file   Highest education level: Associate degree: academic program  Occupational History   Not on file  Tobacco Use   Smoking status: Former    Current packs/day: 0.00    Types: Cigarettes    Quit date: 2015    Years since quitting: 10.7   Smokeless tobacco: Never  Vaping Use   Vaping status: Never Used  Substance and Sexual Activity   Alcohol use: Yes    Comment: occasionally   Drug use: No   Sexual activity: Never  Other Topics Concern   Not on file  Social History Narrative   Divorced   Works at [employer]   Went to [employer] for 2 years college   Enjoys reading and traveling   Social Drivers of Corporate investment banker Strain: Low Risk  (11/29/2023)   Overall Financial Resource Strain (CARDIA)    Difficulty of Paying Living Expenses: Not very hard  Food Insecurity: No Food Insecurity (11/29/2023)   Hunger Vital Sign  Worried About Programme researcher, broadcasting/film/video in the Last Year: Never true    Ran Out of Food in the Last Year: Never true  Transportation Needs: No Transportation Needs (11/29/2023)   PRAPARE - Administrator, Civil Service (Medical): No    Lack of Transportation (Non-Medical): No  Physical Activity: Unknown (11/29/2023)   Exercise Vital Sign    Days of Exercise per Week: Patient declined    Minutes of Exercise per Session: Not on file  Stress: Stress Concern Present (11/29/2023)   [employer] of Occupational Health - Occupational Stress Questionnaire    Feeling of Stress: To some extent  Social Connections: Moderately Isolated (11/29/2023)   Social Connection and Isolation Panel    Frequency of Communication with Friends and Family: More than three times a  week    Frequency of Social Gatherings with Friends and Family: Twice a week    Attends Religious Services: 1 to 4 times per year    Active Member of [employer] or Organizations: No    Attends Engineer, structural: Not on file    Marital Status: Divorced   Family History  Problem Relation Age of Onset   Cancer Mother    Heart attack Mother    CVA Mother    Thyroid disease Mother    Kidney failure Father    Thyroid disease Sister    Heart failure Maternal Grandmother    Allergies  Allergen Reactions   Erythromycin     REACTION: rash   Current Outpatient Medications  Medication Sig Dispense Refill   amLODipine  (NORVASC ) 10 MG tablet Take 1 tablet (10 mg total) by mouth daily. 90 tablet 1   atorvastatin  (LIPITOR) 80 MG tablet Take 1 tablet (80 mg total) by mouth daily. 90 tablet 1   desvenlafaxine  (PRISTIQ ) 100 MG 24 hr tablet TAKE 1 TABLET (100 MG) BY MOUTH DAILY 90 tablet 1   doxycycline  (VIBRAMYCIN ) 100 MG capsule Take 100 mg by mouth daily.     mupirocin  ointment (BACTROBAN ) 2 % Apply 1 Application topically daily. Apply daily to inflamed areas 22 g 1   Semaglutide -Weight Management (WEGOVY ) 0.25 MG/0.5ML SOAJ Inject 0.25 mg into the skin once a week. 2 mL 2   spironolactone  (ALDACTONE ) 100 MG tablet Take 1 tablet (100 mg total) by mouth daily. 30 tablet 11   No current facility-administered medications for this visit.   No results found.   Review of Systems:   A ROS was performed including pertinent positives and negatives as documented in the HPI.  Physical Exam :   Constitutional: NAD and appears stated age Neurological: Alert and oriented Psych: Appropriate affect and cooperative There were no vitals taken for this visit.   Comprehensive Musculoskeletal Exam:    No tenderness with palpation over the distal radius or DRUJ.  Patient demonstrates good active range of motion with wrist flexion extension as well as radial/ulnar deviation.  Grip strength 5/5.  She  has some tenderness over the 4th and 5th metacarpals.  No visible swelling or deformities.  Radial pulse 2+.  Imaging:   Xray (left wrist 4 views): New sclerosis seen in the distal radius suggestive of healing nondisplaced distal radius fracture   I personally reviewed and interpreted the radiographs.      Assessment & Plan Left wrist injury Patient is 3 weeks status post FOOSH injury to the left wrist.  X-rays today support that she sustained a small nondisplaced fracture of the distal radius which does show increased  signs of healing.  Overall she is symptomatically doing much better with use of the wrist brace and demonstrates good active range of motion and grip strength.  Recommend that she continue immobilization for the next 2 weeks particular for higher level activities, and then can wean out of the brace fully as tolerated.  I will plan to see her back in another 6 weeks for repeat x-rays to ensure good healing progression.     I personally saw and evaluated the patient, and participated in the management and treatment plan.  Leonce Reveal, PA-C Orthopedics

## 2023-11-29 NOTE — Patient Instructions (Signed)
 Vitamin B12 Injection What is this medication? Vitamin B12 (VAHY tuh min B12) prevents and treats low vitamin B12 levels in your body. It is used in people who do not get enough vitamin B12 from their diet or when their digestive tract does not absorb enough. Vitamin B12 plays an important role in maintaining the health of your nervous system and red blood cells. This medicine may be used for other purposes; ask your health care provider or pharmacist if you have questions. COMMON BRAND NAME(S): B-12 Compliance Kit, B-12 Injection Kit, Cyomin, Dodex , LA-12, Nutri-Twelve, Physicians EZ Use B-12, Primabalt, Vitamin Deficiency Injectable System - B12 What should I tell my care team before I take this medication? They need to know if you have any of these conditions: Kidney disease Leber's disease Megaloblastic anemia An unusual or allergic reaction to cyanocobalamin , cobalt, other medications, foods, dyes, or preservatives Pregnant or trying to get pregnant Breast-feeding How should I use this medication? This medication is injected into a muscle or deeply under the skin. It is usually given in a clinic or care team's office. However, your care team may teach you how to inject yourself. Follow all instructions. Talk to your care team about the use of this medication in children. Special care may be needed. Overdosage: If you think you have taken too much of this medicine contact a poison control center or emergency room at once. NOTE: This medicine is only for you. Do not share this medicine with others. What if I miss a dose? If you are given your dose at a clinic or care team's office, call to reschedule your appointment. If you give your own injections, and you miss a dose, take it as soon as you can. If it is almost time for your next dose, take only that dose. Do not take double or extra doses. What may interact with this medication? Alcohol Colchicine This list may not describe all possible  interactions. Give your health care provider a list of all the medicines, herbs, non-prescription drugs, or dietary supplements you use. Also tell them if you smoke, drink alcohol, or use illegal drugs. Some items may interact with your medicine. What should I watch for while using this medication? Visit your care team regularly. You may need blood work done while you are taking this medication. You may need to follow a special diet. Talk to your care team. Limit your alcohol intake and avoid smoking to get the best benefit. What side effects may I notice from receiving this medication? Side effects that you should report to your care team as soon as possible: Allergic reactions--skin rash, itching, hives, swelling of the face, lips, tongue, or throat Swelling of the ankles, hands, or feet Trouble breathing Side effects that usually do not require medical attention (report to your care team if they continue or are bothersome): Diarrhea This list may not describe all possible side effects. Call your doctor for medical advice about side effects. You may report side effects to FDA at 1-800-FDA-1088. Where should I keep my medication? Keep out of the reach of children. Store at room temperature between 15 and 30 degrees C (59 and 85 degrees F). Protect from light. Throw away any unused medication after the expiration date. NOTE: This sheet is a summary. It may not cover all possible information. If you have questions about this medicine, talk to your doctor, pharmacist, or health care provider.  2024 Elsevier/Gold Standard (2020-10-12 00:00:00)

## 2023-11-30 ENCOUNTER — Ambulatory Visit: Admitting: Physician Assistant

## 2023-11-30 ENCOUNTER — Encounter: Payer: Self-pay | Admitting: Physician Assistant

## 2023-11-30 VITALS — BP 138/80 | HR 87 | Temp 97.7°F | Ht 66.0 in | Wt 319.0 lb

## 2023-11-30 DIAGNOSIS — Z01818 Encounter for other preprocedural examination: Secondary | ICD-10-CM | POA: Diagnosis not present

## 2023-11-30 DIAGNOSIS — E669 Obesity, unspecified: Secondary | ICD-10-CM

## 2023-11-30 DIAGNOSIS — K760 Fatty (change of) liver, not elsewhere classified: Secondary | ICD-10-CM

## 2023-11-30 NOTE — Progress Notes (Signed)
 Savannah Roy is a 63 y.o. female here for a follow up of a pre-existing problem.  History of Present Illness:   Chief Complaint  Patient presents with   Surgery clearance    Pt stated that she is needing surgery clearance for a lipoma that is on her back   Discussed the use of AI scribe software for clinical note transcription with the patient, who gave verbal consent to proceed.  History of Present Illness   Savannah Roy is a 63 year old female who presents for a pre-surgical evaluation requiring an EKG. She is having a lipoma removed under general anesthesia.  She experiences no chest pain with or without activity. Shortness of breath is present and attributed to lack of exercise. No EKG has been performed in the past three years. She has seen cardiology in the past and has had no significant abnormal tests. She has been cleared from a cardiology standpoint in the past. She has had surgery without issues with anesthesia in the past.  She previously used Wegovy  for weight management but discontinued it due to loss of coverage. She does have history of obstructive sleep apnea but she is not on any treatment(s) for this and does not want to pursue this further. We do not have records of her sleep test. Her son noted a reduction in snoring since starting Wegovy .   Past Medical History:  Diagnosis Date   Anxiety    Depression    Hidradenitis suppurativa    Hypertension    LVH (left ventricular hypertrophy) due to hypertensive disease 05/10/2016   Shortness of breath 04/07/2016   Sleep apnea      Social History   Tobacco Use   Smoking status: Former    Current packs/day: 0.00    Types: Cigarettes    Quit date: 2015    Years since quitting: 10.8   Smokeless tobacco: Never  Vaping Use   Vaping status: Never Used  Substance Use Topics   Alcohol use: Yes    Comment: occasionally   Drug use: No    Past Surgical History:  Procedure Laterality Date   CHOLECYSTECTOMY N/A  12/17/2020   Procedure: LAPAROSCOPIC CHOLECYSTECTOMY;  Surgeon: Lyndel Deward PARAS, MD;  Location: [facility];  Service: General;  Laterality: N/A;   CYSTECTOMY Right    gum cyst removal   ENDOMETRIAL ABLATION W/ NOVASURE  2007   HERNIA REPAIR  1985   KNEE ARTHROSCOPY Right 2005    Family History  Problem Relation Age of Onset   Cancer Mother    Heart attack Mother    CVA Mother    Thyroid disease Mother    Kidney failure Father    Thyroid disease Sister    Heart failure Maternal Grandmother     Allergies  Allergen Reactions   Erythromycin     REACTION: rash    Current Medications:   Current Outpatient Medications:    amLODipine  (NORVASC ) 10 MG tablet, Take 1 tablet (10 mg total) by mouth daily., Disp: 90 tablet, Rfl: 1   atorvastatin  (LIPITOR) 80 MG tablet, Take 1 tablet (80 mg total) by mouth daily., Disp: 90 tablet, Rfl: 1   desvenlafaxine  (PRISTIQ ) 100 MG 24 hr tablet, TAKE 1 TABLET (100 MG) BY MOUTH DAILY, Disp: 90 tablet, Rfl: 1   doxycycline  (VIBRAMYCIN ) 100 MG capsule, Take 100 mg by mouth daily., Disp: , Rfl:    mupirocin  ointment (BACTROBAN ) 2 %, Apply 1 Application topically daily. Apply daily to inflamed areas, Disp:  22 g, Rfl: 1   Semaglutide -Weight Management (WEGOVY ) 0.25 MG/0.5ML SOAJ, Inject 0.25 mg into the skin once a week., Disp: 2 mL, Rfl: 2   spironolactone  (ALDACTONE ) 100 MG tablet, Take 1 tablet (100 mg total) by mouth daily., Disp: 30 tablet, Rfl: 11   Review of Systems:   Negative unless otherwise specified per HPI.  Vitals:   Vitals:   11/30/23 1333  BP: 138/80  Pulse: 87  Temp: 97.7 F (36.5 C)  SpO2: 98%  Weight: (!) 319 lb (144.7 kg)  Height: 5' 6 (1.676 m)     Body mass index is 51.49 kg/m.  Physical Exam:   Physical Exam Vitals and nursing note reviewed.  Constitutional:      General: She is not in acute distress.    Appearance: She is well-developed. She is not ill-appearing or toxic-appearing.  Cardiovascular:     Rate  and Rhythm: Normal rate and regular rhythm.     Pulses: Normal pulses.     Heart sounds: Normal heart sounds, S1 normal and S2 normal.  Pulmonary:     Effort: Pulmonary effort is normal.     Breath sounds: Normal breath sounds.  Skin:    General: Skin is warm and dry.  Neurological:     Mental Status: She is alert.     GCS: GCS eye subscore is 4. GCS verbal subscore is 5. GCS motor subscore is 6.  Psychiatric:        Speech: Speech normal.        Behavior: Behavior normal. Behavior is cooperative.     Assessment and Plan:   Assessment and Plan    Preoperative Evaluation for Surgery Preoperative evaluation required. No chest pain or unexplained dyspnea. Dyspnea due to inactivity. No anesthesia issues previously. - EKG tracing is personally reviewed.  EKG notes NSR.  No acute changes.  - She is overall low risk candidate for surgery and has had prior cardiology clearance   Obesity; Hepatic Steatosis Obesity with ultrasound-confirmed fatty liver in March 2022. Previous Wegovy  use beneficial. Awaiting further evidence for post-surgery prescription. - Await further evidence for Wegovy  prescription for fatty liver. - Consider Wegovy  prescription after surgery if evidence supports.   Lucie Buttner, PA-C

## 2023-11-30 NOTE — Telephone Encounter (Signed)
 Appt today

## 2023-12-03 NOTE — Telephone Encounter (Signed)
 Surgical clearance form and office notes faxed over to [facility].

## 2023-12-06 ENCOUNTER — Inpatient Hospital Stay

## 2023-12-06 VITALS — BP 158/84 | HR 93 | Temp 97.7°F | Resp 18

## 2023-12-06 DIAGNOSIS — E538 Deficiency of other specified B group vitamins: Secondary | ICD-10-CM | POA: Diagnosis not present

## 2023-12-06 MED ORDER — CYANOCOBALAMIN 1000 MCG/ML IJ SOLN
1000.0000 ug | Freq: Once | INTRAMUSCULAR | Status: AC
  Administered 2023-12-06: 1000 ug via INTRAMUSCULAR
  Filled 2023-12-06: qty 1

## 2023-12-06 NOTE — Patient Instructions (Signed)
 Vitamin B12 Injection What is this medication? Vitamin B12 (VAHY tuh min B12) prevents and treats low vitamin B12 levels in your body. It is used in people who do not get enough vitamin B12 from their diet or when their digestive tract does not absorb enough. Vitamin B12 plays an important role in maintaining the health of your nervous system and red blood cells. This medicine may be used for other purposes; ask your health care provider or pharmacist if you have questions. COMMON BRAND NAME(S): B-12 Compliance Kit, B-12 Injection Kit, Cyomin, Dodex , LA-12, Nutri-Twelve, Physicians EZ Use B-12, Primabalt, Vitamin Deficiency Injectable System - B12 What should I tell my care team before I take this medication? They need to know if you have any of these conditions: Kidney disease Leber's disease Megaloblastic anemia An unusual or allergic reaction to cyanocobalamin , cobalt, other medications, foods, dyes, or preservatives Pregnant or trying to get pregnant Breast-feeding How should I use this medication? This medication is injected into a muscle or deeply under the skin. It is usually given in a clinic or care team's office. However, your care team may teach you how to inject yourself. Follow all instructions. Talk to your care team about the use of this medication in children. Special care may be needed. Overdosage: If you think you have taken too much of this medicine contact a poison control center or emergency room at once. NOTE: This medicine is only for you. Do not share this medicine with others. What if I miss a dose? If you are given your dose at a clinic or care team's office, call to reschedule your appointment. If you give your own injections, and you miss a dose, take it as soon as you can. If it is almost time for your next dose, take only that dose. Do not take double or extra doses. What may interact with this medication? Alcohol Colchicine This list may not describe all possible  interactions. Give your health care provider a list of all the medicines, herbs, non-prescription drugs, or dietary supplements you use. Also tell them if you smoke, drink alcohol, or use illegal drugs. Some items may interact with your medicine. What should I watch for while using this medication? Visit your care team regularly. You may need blood work done while you are taking this medication. You may need to follow a special diet. Talk to your care team. Limit your alcohol intake and avoid smoking to get the best benefit. What side effects may I notice from receiving this medication? Side effects that you should report to your care team as soon as possible: Allergic reactions--skin rash, itching, hives, swelling of the face, lips, tongue, or throat Swelling of the ankles, hands, or feet Trouble breathing Side effects that usually do not require medical attention (report to your care team if they continue or are bothersome): Diarrhea This list may not describe all possible side effects. Call your doctor for medical advice about side effects. You may report side effects to FDA at 1-800-FDA-1088. Where should I keep my medication? Keep out of the reach of children. Store at room temperature between 15 and 30 degrees C (59 and 85 degrees F). Protect from light. Throw away any unused medication after the expiration date. NOTE: This sheet is a summary. It may not cover all possible information. If you have questions about this medicine, talk to your doctor, pharmacist, or health care provider.  2024 Elsevier/Gold Standard (2020-10-12 00:00:00)

## 2023-12-10 ENCOUNTER — Other Ambulatory Visit: Payer: Self-pay | Admitting: Physician Assistant

## 2023-12-13 ENCOUNTER — Inpatient Hospital Stay

## 2023-12-13 ENCOUNTER — Ambulatory Visit

## 2023-12-13 VITALS — BP 147/76 | HR 93 | Temp 98.1°F | Resp 18

## 2023-12-13 DIAGNOSIS — E538 Deficiency of other specified B group vitamins: Secondary | ICD-10-CM

## 2023-12-13 MED ORDER — CYANOCOBALAMIN 1000 MCG/ML IJ SOLN
1000.0000 ug | Freq: Once | INTRAMUSCULAR | Status: AC
  Administered 2023-12-13: 1000 ug via INTRAMUSCULAR
  Filled 2023-12-13: qty 1

## 2023-12-13 NOTE — Patient Instructions (Signed)
 Vitamin B12 Deficiency Vitamin B12 deficiency occurs when the body does not have enough of this important vitamin. The body needs this vitamin: To make red blood cells. To make DNA. This is the genetic material inside cells. To help the nerves work properly so they can carry messages from the brain to the body. Vitamin B12 deficiency can cause health problems, such as not having enough red blood cells in the blood (anemia). This can lead to nerve damage if untreated. What are the causes? This condition may be caused by: Not eating enough foods that contain vitamin B12. Not having enough stomach acid and digestive fluids to properly absorb vitamin B12 from the food that you eat. Having certain diseases that make it hard to absorb vitamin B12. These diseases include Crohn's disease, chronic pancreatitis, and cystic fibrosis. An autoimmune disorder in which the body does not make enough of a protein (intrinsic factor) within the stomach, resulting in not enough absorption of vitamin B12. Having a surgery in which part of the stomach or small intestine is removed. Taking certain medicines that make it hard for the body to absorb vitamin B12. These include: Heartburn medicines, such as antacids and proton pump inhibitors. Some medicines that are used to treat diabetes. What increases the risk? The following factors may make you more likely to develop a vitamin B12 deficiency: Being an older adult. Eating a vegetarian or vegan diet that does not include any foods that come from animals. Eating a poor diet while you are pregnant. Taking certain medicines. Having alcoholism. What are the signs or symptoms? In some cases, there are no symptoms of this condition. If the condition leads to anemia or nerve damage, various symptoms may occur, such as: Weakness. Tiredness (fatigue). Loss of appetite. Numbness or tingling in your hands and feet. Redness and burning of the tongue. Depression,  confusion, or memory problems. Trouble walking. If anemia is severe, symptoms can include: Shortness of breath. Dizziness. Rapid heart rate. How is this diagnosed? This condition may be diagnosed with a blood test to measure the level of vitamin B12 in your blood. You may also have other tests, including: A group of tests that measure certain characteristics of blood cells (complete blood count, CBC). A blood test to measure intrinsic factor. A procedure where a thin tube with a camera on the end is used to look into your stomach or intestines (endoscopy). Other tests may be needed to discover the cause of the deficiency. How is this treated? Treatment for this condition depends on the cause. This condition may be treated by: Changing your eating and drinking habits, such as: Eating more foods that contain vitamin B12. Drinking less alcohol or no alcohol. Getting vitamin B12 injections. Taking vitamin B12 supplements by mouth (orally). Your health care provider will tell you which dose is best for you. Follow these instructions at home: Eating and drinking  Include foods in your diet that come from animals and contain a lot of vitamin B12. These include: Meats and poultry. This includes beef, pork, chicken, [place], and organ meats, such as liver. Seafood. This includes clams, rainbow trout, salmon, tuna, and haddock. Eggs. Dairy foods such as milk, yogurt, and cheese. Eat foods that have vitamin B12 added to them (are fortified), such as ready-to-eat breakfast cereals. Check the label on the package to see if a food is fortified. The items listed above may not be a complete list of foods and beverages you can eat and drink. Contact a dietitian for  more information. Alcohol use Do not drink alcohol if: Your health care provider tells you not to drink. You are pregnant, may be pregnant, or are planning to become pregnant. If you drink alcohol: Limit how much you have to: 0-1 drink a  day for women. 0-2 drinks a day for men. Know how much alcohol is in your drink. In the [place] one drink equals one 12 oz bottle of beer (355 mL), one 5 oz glass of wine (148 mL), or one 1 oz glass of hard liquor (44 mL). General instructions Get vitamin B12 injections if told to by your health care provider. Take supplements only as told by your health care provider. Follow the directions carefully. Keep all follow-up visits. This is important. Contact a health care provider if: Your symptoms come back. Your symptoms get worse or do not improve with treatment. Get help right away: You develop shortness of breath. You have a rapid heart rate. You have chest pain. You become dizzy or you faint. These symptoms may be an emergency. Get help right away. Call 911. Do not wait to see if the symptoms will go away. Do not drive yourself to the hospital. Summary Vitamin B12 deficiency occurs when the body does not have enough of this important vitamin. Common causes include not eating enough foods that contain vitamin B12, not being able to absorb vitamin B12 from the food that you eat, having a surgery in which part of the stomach or small intestine is removed, or taking certain medicines. Eat foods that have vitamin B12 in them. Treatment may include making a change in the way you eat and drink, getting vitamin B12 injections, or taking vitamin B12 supplements. This information is not intended to replace advice given to you by your health care provider. Make sure you discuss any questions you have with your health care provider. Document Revised: 09/24/2020 Document Reviewed: 09/24/2020 Elsevier Patient Education  2024 [employer].

## 2023-12-17 ENCOUNTER — Encounter: Payer: Self-pay | Admitting: Radiology

## 2023-12-18 ENCOUNTER — Ambulatory Visit: Admitting: Physician Assistant

## 2024-01-17 ENCOUNTER — Ambulatory Visit: Payer: Self-pay | Admitting: Surgery

## 2024-01-24 ENCOUNTER — Encounter: Payer: Self-pay | Admitting: Oncology

## 2024-01-24 NOTE — Patient Instructions (Signed)
 SURGICAL WAITING ROOM VISITATION  Patients having surgery or a procedure may have no more than 2 support people in the waiting area - these visitors may rotate.    Children ages 39 and under will not be able to visit patients in [facility] under most circumstances.   Visitors with respiratory illnesses are discouraged from visiting and should remain at home.  If the patient needs to stay at the hospital during part of their recovery, the visitor guidelines for inpatient rooms apply. Pre-op nurse will coordinate an appropriate time for 1 support person to accompany patient in pre-op.  This support person may not rotate.    Please refer to the [facility] website for the visitor guidelines for Inpatients (after your surgery is over and you are in a regular room).       Your procedure is scheduled on: 02/04/24   Report to [facility] Main Entrance    Report to admitting at 8:30 AM   Call this number if you have problems the morning of surgery [phone]   Do not eat food :After Midnight.   After Midnight you may have the following liquids until 7:40 AM DAY OF SURGERY  Water Non-Citrus Juices (without pulp, NO RED-Apple, White grape, White cranberry) Black Coffee (NO MILK/CREAM OR CREAMERS, sugar ok)  Clear Tea (NO MILK/CREAM OR CREAMERS, sugar ok) regular and decaf                             Plain Jell-O (NO RED)                                           Fruit ices (not with fruit pulp, NO RED)                                     Popsicles (NO RED)                                                               Sports drinks like Gatorade (NO RED)                  Oral Hygiene is also important to reduce your risk of infection.                                    Remember - BRUSH YOUR TEETH THE MORNING OF SURGERY WITH YOUR REGULAR TOOTHPASTE  DENTURES WILL BE REMOVED PRIOR TO SURGERY PLEASE DO NOT APPLY Poly grip OR ADHESIVES!!!    Stop all vitamins and  herbal supplements 7 days before surgery.   Take these medicines the morning of surgery with A SIP OF WATER: amlodipine , atorvastatin , desvenlafaxine (pristiq )  Bring CPAP mask and tubing day of surgery.                              You may not have any metal on your body including hair pins, jewelry,  and body piercing             Do not wear make-up, lotions, powders, perfumes/cologne, or deodorant  Do not wear nail polish including gel and S&S, artificial/acrylic nails, or any other type of covering on natural nails including finger and toenails. If you have artificial nails, gel coating, etc. that needs to be removed by a nail salon please have this removed prior to surgery or surgery may need to be canceled/ delayed if the surgeon/ anesthesia feels like they are unable to be safely monitored.   Do not shave  48 hours prior to surgery.              Do not bring valuables to the hospital. [facility] IS NOT             RESPONSIBLE   FOR VALUABLES.   Contacts, glasses, dentures or bridgework may not be worn into surgery.   DO NOT BRING YOUR HOME MEDICATIONS TO THE HOSPITAL. PHARMACY WILL DISPENSE MEDICATIONS LISTED ON YOUR MEDICATION LIST TO YOU DURING YOUR ADMISSION IN THE HOSPITAL!    Patients discharged on the day of surgery will not be allowed to drive home.  Someone NEEDS to stay with you for the first 24 hours after anesthesia.   Special Instructions: Bring a copy of your healthcare power of attorney and living will documents the day of surgery if you haven't scanned them before.              Please read over the following fact sheets you were given: IF YOU HAVE QUESTIONS ABOUT YOUR PRE-OP INSTRUCTIONS PLEASE [phone] [place]   If you received a COVID test during your pre-op visit  it is requested that you wear a mask when out in public, stay away from anyone that may not be feeling well and notify your surgeon if you develop symptoms. If you test positive for Covid or have  been in contact with anyone that has tested positive in the last 10 days please notify you surgeon.    [facility] - Preparing for Surgery Before surgery, you can play an important role.  Because skin is not sterile, your skin needs to be as free of germs as possible.  You can reduce the number of germs on your skin by washing with CHG (chlorahexidine gluconate) soap before surgery.  CHG is an antiseptic cleaner which kills germs and bonds with the skin to continue killing germs even after washing. Please DO NOT use if you have an allergy to CHG or antibacterial soaps.  If your skin becomes reddened/irritated stop using the CHG and inform your nurse when you arrive at Short Stay. Do not shave (including legs and underarms) for at least 48 hours prior to the first CHG shower.  You may shave your face/neck.  Please follow these instructions carefully:  1.  Shower with CHG Soap the night before surgery ONLY (DO NOT USE THE SOAP THE MORNING OF SURGERY).  2.  If you choose to wash your hair, wash your hair first as usual with your normal  shampoo.  3.  After you shampoo, rinse your hair and body thoroughly to remove the shampoo.                             4.  Use CHG as you would any other liquid soap.  You can apply chg directly to the skin and wash.  Gently with a  scrungie or clean washcloth.  5.  Apply the CHG Soap to your body ONLY FROM THE NECK DOWN.   Do   not use on face/ open                           Wound or open sores. Avoid contact with eyes, ears mouth and   genitals (private parts).                       Wash face,  Genitals (private parts) with your normal soap.             6.  Wash thoroughly, paying special attention to the area where your    surgery  will be performed.  7.  Thoroughly rinse your body with warm water from the neck down.  8.  DO NOT shower/wash with your normal soap after using and rinsing off the CHG Soap.                9.  Pat yourself dry with a clean towel.             10.  Wear clean pajamas.            11.  Place clean sheets on your bed the night of your first shower and do not  sleep with pets. Day of Surgery : Do not apply any CHG, lotions/deodorants the morning of surgery.  Please wear clean clothes to the hospital/surgery center.  FAILURE TO FOLLOW THESE INSTRUCTIONS MAY RESULT IN THE CANCELLATION OF YOUR SURGERY  PATIENT SIGNATURE_________________________________  NURSE SIGNATURE__________________________________  ________________________________________________________________________

## 2024-01-24 NOTE — Progress Notes (Signed)
 COVID Vaccine received:  []  No [x]  Yes Date of any COVID positive Test in last 90 days: no PCP - Lucie Buttner PA Cardiologist - n/a  Chest x-ray -  EKG -  11/30/23 Epic Stress Test - 04/19/16 Epic ECHO - 08/12/20 Epic Cardiac Cath -   Bowel Prep - [x]  No  []   Yes ______  Pacemaker / ICD device [x]  No []  Yes   Spinal Cord Stimulator:[x]  No []  Yes       History of Sleep Apnea? []  No [x]  Yes   CPAP used?- [x]  No []  Yes    Does the patient monitor blood sugar?          [x]  No []  Yes  []  N/A  Patient has: [x]  NO Hx DM   []  Pre-DM                 []  DM1  []   DM2 Does patient have a [employer] or [employer]om? []  No []  Yes   Fasting Blood Sugar Ranges-  Checks Blood Sugar _____ times a day  GLP1 agonist / usual dose - no GLP1 instructions:  SGLT-2 inhibitors / usual dose - no SGLT-2 instructions:   Blood Thinner / Instructions:no Aspirin Instructions:no  Comments:   Activity level: Patient is able to climb a flight of stairs without difficulty; [x]  No CP  [x]  No SOB,   Patient can  perform ADLs without assistance.   Anesthesia review: HTN, leukopenia, L ventricular hypertrophy.(ECHO 08/12/20)  Patient denies shortness of breath, fever, cough and chest pain at PAT appointment.  Patient verbalized understanding and agreement to the Pre-Surgical Instructions that were given to them at this PAT appointment. Patient was also educated of the need to review these PAT instructions again prior to his/her surgery.I reviewed the appropriate phone numbers to call if they have any and questions or concerns.

## 2024-01-25 ENCOUNTER — Encounter (HOSPITAL_COMMUNITY)
Admission: RE | Admit: 2024-01-25 | Discharge: 2024-01-25 | Disposition: A | Source: Ambulatory Visit | Attending: Surgery

## 2024-01-25 ENCOUNTER — Encounter (HOSPITAL_COMMUNITY): Payer: Self-pay

## 2024-01-25 ENCOUNTER — Other Ambulatory Visit: Payer: Self-pay

## 2024-01-25 VITALS — BP 186/91 | HR 89 | Temp 98.5°F | Resp 20 | Ht 66.0 in | Wt 320.0 lb

## 2024-01-25 DIAGNOSIS — Z01818 Encounter for other preprocedural examination: Secondary | ICD-10-CM

## 2024-01-25 DIAGNOSIS — I10 Essential (primary) hypertension: Secondary | ICD-10-CM

## 2024-01-25 LAB — CBC
HCT: 44.1 % (ref 36.0–46.0)
Hemoglobin: 13.7 g/dL (ref 12.0–15.0)
MCH: 28 pg (ref 26.0–34.0)
MCHC: 31.1 g/dL (ref 30.0–36.0)
MCV: 90.2 fL (ref 80.0–100.0)
Platelets: 168 K/uL (ref 150–400)
RBC: 4.89 MIL/uL (ref 3.87–5.11)
RDW: 14.6 % (ref 11.5–15.5)
WBC: 3.8 K/uL — ABNORMAL LOW (ref 4.0–10.5)
nRBC: 0 % (ref 0.0–0.2)

## 2024-02-04 ENCOUNTER — Ambulatory Visit (HOSPITAL_COMMUNITY)
Admission: RE | Admit: 2024-02-04 | Discharge: 2024-02-04 | Disposition: A | Discharge status: Home / Self Care | Source: Ambulatory Visit | Attending: Surgery | Admitting: Surgery

## 2024-02-04 ENCOUNTER — Encounter (HOSPITAL_COMMUNITY): Payer: Self-pay | Admitting: Surgery

## 2024-02-04 ENCOUNTER — Encounter (HOSPITAL_COMMUNITY): Payer: Self-pay | Admitting: Medical

## 2024-02-04 ENCOUNTER — Ambulatory Visit (HOSPITAL_COMMUNITY): Admitting: Anesthesiology

## 2024-02-04 ENCOUNTER — Encounter (HOSPITAL_COMMUNITY): Admission: RE | Disposition: A | Payer: Self-pay | Source: Ambulatory Visit | Attending: Surgery

## 2024-02-04 DIAGNOSIS — I10 Essential (primary) hypertension: Secondary | ICD-10-CM | POA: Diagnosis not present

## 2024-02-04 DIAGNOSIS — D171 Benign lipomatous neoplasm of skin and subcutaneous tissue of trunk: Secondary | ICD-10-CM | POA: Insufficient documentation

## 2024-02-04 DIAGNOSIS — Z79899 Other long term (current) drug therapy: Secondary | ICD-10-CM | POA: Insufficient documentation

## 2024-02-04 DIAGNOSIS — G4730 Sleep apnea, unspecified: Secondary | ICD-10-CM | POA: Insufficient documentation

## 2024-02-04 DIAGNOSIS — Z87891 Personal history of nicotine dependence: Secondary | ICD-10-CM

## 2024-02-04 HISTORY — PX: EXCISION MASS, BACK: SHX7560

## 2024-02-04 LAB — BASIC METABOLIC PANEL WITH GFR
Anion gap: 13 (ref 5–15)
BUN: 24 mg/dL — ABNORMAL HIGH (ref 8–23)
CO2: 20 mmol/L — ABNORMAL LOW (ref 22–32)
Calcium: 9.3 mg/dL (ref 8.9–10.3)
Chloride: 106 mmol/L (ref 98–111)
Creatinine, Ser: 0.9 mg/dL (ref 0.44–1.00)
GFR, Estimated: >60 mL/min
Glucose, Bld: 120 mg/dL — ABNORMAL HIGH (ref 70–99)
Potassium: 3.6 mmol/L (ref 3.5–5.1)
Sodium: 140 mmol/L (ref 135–145)

## 2024-02-04 SURGERY — EXCISION MASS, BACK
Anesthesia: General | Site: Back | Laterality: Left

## 2024-02-04 MED ORDER — LACTATED RINGERS IV SOLN: INTRAVENOUS | Status: DC

## 2024-02-04 MED ORDER — LIDOCAINE HCL (PF) 2 % IJ SOLN
INTRAMUSCULAR | Status: AC
  Filled 2024-02-04: qty 5

## 2024-02-04 MED ORDER — OXYCODONE HCL 5 MG PO TABS: 5.0000 mg | ORAL_TABLET | Freq: Once | ORAL | Status: DC | PRN

## 2024-02-04 MED ORDER — OXYCODONE HCL 5 MG/5ML PO SOLN: 5.0000 mg | Freq: Once | ORAL | Status: DC | PRN

## 2024-02-04 MED ORDER — OXYCODONE-ACETAMINOPHEN 5-325 MG PO TABS: 1.0000 | ORAL_TABLET | ORAL | 0 refills | Status: AC | PRN

## 2024-02-04 MED ORDER — LIDOCAINE HCL (CARDIAC) PF 100 MG/5ML IV SOSY
PREFILLED_SYRINGE | INTRAVENOUS | Status: DC | PRN
  Administered 2024-02-04: 60 mg via INTRAVENOUS

## 2024-02-04 MED ORDER — PHENYLEPHRINE 80 MCG/ML (10ML) SYRINGE FOR IV PUSH (FOR BLOOD PRESSURE SUPPORT)
PREFILLED_SYRINGE | INTRAVENOUS | Status: AC
  Filled 2024-02-04: qty 10

## 2024-02-04 MED ORDER — MIDAZOLAM HCL 5 MG/5ML IJ SOLN
INTRAMUSCULAR | Status: DC | PRN
  Administered 2024-02-04: 2 mg via INTRAVENOUS

## 2024-02-04 MED ORDER — BUPIVACAINE-EPINEPHRINE 0.25% -1:200000 IJ SOLN
INTRAMUSCULAR | Status: DC | PRN
  Administered 2024-02-04: 30 mL

## 2024-02-04 MED ORDER — DEXAMETHASONE SOD PHOSPHATE PF 10 MG/ML IJ SOLN
INTRAMUSCULAR | Status: DC | PRN
  Administered 2024-02-04: 4 mg via INTRAVENOUS

## 2024-02-04 MED ORDER — ACETAMINOPHEN 500 MG PO TABS: 1000.0000 mg | ORAL_TABLET | ORAL | Status: DC

## 2024-02-04 MED ORDER — CEFAZOLIN SODIUM-DEXTROSE 3-4 GM/150ML-% IV SOLN
3.0000 g | INTRAVENOUS | Status: AC
  Administered 2024-02-04: 3 g via INTRAVENOUS
  Filled 2024-02-04: qty 150

## 2024-02-04 MED ORDER — CHLORHEXIDINE GLUCONATE 0.12 % MT SOLN
15.0000 mL | Freq: Once | OROMUCOSAL | Status: AC
  Administered 2024-02-04: 15 mL via OROMUCOSAL

## 2024-02-04 MED ORDER — BUPIVACAINE-EPINEPHRINE (PF) 0.25% -1:200000 IJ SOLN
INTRAMUSCULAR | Status: AC
  Filled 2024-02-04: qty 30

## 2024-02-04 MED ORDER — CHLORHEXIDINE GLUCONATE CLOTH 2 % EX PADS: 6.0000 | MEDICATED_PAD | Freq: Once | CUTANEOUS | Status: DC

## 2024-02-04 MED ORDER — SUCCINYLCHOLINE CHLORIDE 200 MG/10ML IV SOSY
PREFILLED_SYRINGE | INTRAVENOUS | Status: DC | PRN
  Administered 2024-02-04: 200 mg via INTRAVENOUS

## 2024-02-04 MED ORDER — PROPOFOL 10 MG/ML IV BOLUS
INTRAVENOUS | Status: DC | PRN
  Administered 2024-02-04: 200 mg via INTRAVENOUS

## 2024-02-04 MED ORDER — ORAL CARE MOUTH RINSE: 15.0000 mL | Freq: Once | OROMUCOSAL | Status: AC

## 2024-02-04 MED ORDER — ONDANSETRON HCL 4 MG/2ML IJ SOLN
INTRAMUSCULAR | Status: DC | PRN
  Administered 2024-02-04: 4 mg via INTRAVENOUS

## 2024-02-04 MED ORDER — PHENYLEPHRINE HCL (PRESSORS) 10 MG/ML IV SOLN
INTRAVENOUS | Status: DC | PRN
  Administered 2024-02-04 (×2): 80 ug via INTRAVENOUS

## 2024-02-04 MED ORDER — FENTANYL CITRATE (PF) 100 MCG/2ML IJ SOLN
INTRAMUSCULAR | Status: AC
  Filled 2024-02-04: qty 2

## 2024-02-04 MED ORDER — 0.9 % SODIUM CHLORIDE (POUR BTL) OPTIME
TOPICAL | Status: DC | PRN
  Administered 2024-02-04: 1000 mL

## 2024-02-04 MED ORDER — ROCURONIUM BROMIDE 10 MG/ML (PF) SYRINGE
PREFILLED_SYRINGE | INTRAVENOUS | Status: AC
  Filled 2024-02-04: qty 10

## 2024-02-04 MED ORDER — DROPERIDOL 2.5 MG/ML IJ SOLN: 0.6250 mg | Freq: Once | INTRAMUSCULAR | Status: DC | PRN

## 2024-02-04 MED ORDER — SUCCINYLCHOLINE CHLORIDE 200 MG/10ML IV SOSY
PREFILLED_SYRINGE | INTRAVENOUS | Status: AC
  Filled 2024-02-04: qty 20

## 2024-02-04 MED ORDER — FENTANYL CITRATE (PF) 100 MCG/2ML IJ SOLN
INTRAMUSCULAR | Status: DC | PRN
  Administered 2024-02-04 (×2): 50 ug via INTRAVENOUS

## 2024-02-04 MED ORDER — GLYCOPYRROLATE 0.2 MG/ML IJ SOLN
INTRAMUSCULAR | Status: DC | PRN
  Administered 2024-02-04: .2 mg via INTRAVENOUS

## 2024-02-04 MED ORDER — FENTANYL CITRATE (PF) 50 MCG/ML IJ SOSY: 25.0000 ug | PREFILLED_SYRINGE | INTRAMUSCULAR | Status: DC | PRN

## 2024-02-04 MED ORDER — ACETAMINOPHEN 500 MG PO TABS
1000.0000 mg | ORAL_TABLET | Freq: Once | ORAL | Status: AC
  Administered 2024-02-04: 1000 mg via ORAL
  Filled 2024-02-04: qty 2

## 2024-02-04 MED ORDER — MIDAZOLAM HCL 2 MG/2ML IJ SOLN
INTRAMUSCULAR | Status: AC
  Filled 2024-02-04: qty 2

## 2024-02-04 MED ORDER — PROPOFOL 10 MG/ML IV BOLUS
INTRAVENOUS | Status: AC
  Filled 2024-02-04: qty 20

## 2024-02-04 MED ORDER — ONDANSETRON HCL 4 MG/2ML IJ SOLN
INTRAMUSCULAR | Status: AC
  Filled 2024-02-04: qty 2

## 2024-02-04 SURGICAL SUPPLY — 23 items
BAG COUNTER SPONGE SURGICOUNT (BAG) IMPLANT
BLADE SURG 15 STRL LF DISP TIS (BLADE) ×1 IMPLANT
CHLORAPREP W/TINT 26 (MISCELLANEOUS) ×1 IMPLANT
COVER SURGICAL LIGHT HANDLE (MISCELLANEOUS) ×1 IMPLANT
DERMABOND ADVANCED .7 DNX12 (GAUZE/BANDAGES/DRESSINGS) ×1 IMPLANT
DRAPE LAPAROSCOPIC ABDOMINAL (DRAPES) ×1 IMPLANT
ELECT REM PT RETURN 15FT ADLT (MISCELLANEOUS) ×1 IMPLANT
GAUZE 4X4 16PLY ~~LOC~~+RFID DBL (SPONGE) IMPLANT
GAUZE SPONGE 4X4 12PLY STRL (GAUZE/BANDAGES/DRESSINGS) ×1 IMPLANT
GLOVE BIO SURGEON STRL SZ7.5 (GLOVE) ×1 IMPLANT
GLOVE INDICATOR 8.0 STRL GRN (GLOVE) ×1 IMPLANT
GOWN STRL REUS W/ TWL XL LVL3 (GOWN DISPOSABLE) ×1 IMPLANT
KIT BASIN OR (CUSTOM PROCEDURE TRAY) ×1 IMPLANT
KIT TURNOVER KIT A (KITS) ×1 IMPLANT
NS IRRIG 1000ML POUR BTL (IV SOLUTION) ×1 IMPLANT
PACK BASIC VI WITH GOWN DISP (CUSTOM PROCEDURE TRAY) ×1 IMPLANT
PENCIL SMOKE EVACUATOR (MISCELLANEOUS) ×1 IMPLANT
SPIKE FLUID TRANSFER (MISCELLANEOUS) ×1 IMPLANT
SPONGE T-LAP 18X18 ~~LOC~~+RFID (SPONGE) IMPLANT
SUT MNCRL AB 4-0 PS2 18 (SUTURE) ×1 IMPLANT
SUT VIC AB 3-0 SH 27XBRD (SUTURE) ×1 IMPLANT
SYR BULB IRRIG 60ML STRL (SYRINGE) ×1 IMPLANT
SYR CONTROL 10ML LL (SYRINGE) ×1 IMPLANT

## 2024-02-04 NOTE — Op Note (Signed)
" ° °  Patient: Savannah Roy ([birth year], [id number])  Date of Surgery: 02/04/2024  Preoperative Diagnosis: LEFT UPPER BACK LIPOMA   Postoperative Diagnosis: LEFT UPPER BACK LIPOMA   Surgical Procedure: EXCISION MASS, BACK    Operative Team Members:  Surgeons and Role:    * Kwamane Whack, Deward PARAS, MD - Primary   Anesthesiologist: Merla Almarie HERO, DO CRNA: Obadiah Reyes BROCKS, CRNA; Brandy Almarie BROCKS, CRNA   Anesthesia: General   Fluids:  Total I/O In: 150 [IV Piggyback:150] Out: -   Complications: None  Drains:  none   Specimen:  ID Type Source Tests Collected by Time Destination  1 : Left upper back lipoma Tissue PATH Other SURGICAL PATHOLOGY Vester Balthazor, Deward PARAS, MD 02/04/2024 1124      Disposition:  PACU - hemodynamically stable.  Plan of Care: Discharge to home after PACU    Indications for Procedure: Savannah Roy is a 63 y.o. female who presented with a left back lipoma.  The procedure itself as well as its risks, benefits and alternatives were discussed.  The risks discussed included but were not limited to the risk of infection, bleeding, damage to nearby structures, and recurrent lipoma.  After a full discussion and all questions answered the patient granted consent to proceed.  Findings: Left back lipoma in the subcutaneous tissues 2.5 x 1.2 x 2.3 cm    Description of Procedure:   On the day stated above patient taken operating room suite.  After anesthesia she was placed in right lateral positioning and the back was prepped and draped in usual sterile fashion.  I anesthetized the area using local and then made a horizontal incision over the lipoma.  I dissected down to the lipoma and dissected free of the surrounding subcutaneous tissues.  It was passed off the field as a specimen.  It measured 2.5 x 1.2 x 2.3 cm.  The wound was irrigated.  The deeper tissues were closed using 3-0 Vicryl suture and the skin was closed using 4-0 Monocryl and  Dermabond.  All sponge needle counts correct at the end of this case.  At the end of the case we reviewed the infection status of the case. Patient: Private Patient Elective Case Case: Elective Infection Present At Time Of Surgery (PATOS): None  Deward Foy, MD General, Bariatric, & Minimally Invasive Surgery [facility], [place]  "

## 2024-02-04 NOTE — Anesthesia Procedure Notes (Signed)
 Procedure Name: Intubation Date/Time: 02/04/2024 11:30 AM  Performed by: Obadiah Reyes BROCKS, CRNAPre-anesthesia Checklist: Patient identified, Emergency Drugs available, Suction available and Patient being monitored Patient Re-evaluated:Patient Re-evaluated prior to induction Oxygen Delivery Method: Circle System Utilized Preoxygenation: Pre-oxygenation with 100% oxygen Induction Type: IV induction Ventilation: Mask ventilation without difficulty Laryngoscope Size: Glidescope and 3 Grade View: Grade I Tube type: Oral Number of attempts: 1 Airway Equipment and Method: Stylet and Oral airway Placement Confirmation: ETT inserted through vocal cords under direct vision, positive ETCO2 and breath sounds checked- equal and bilateral Secured at: 22 cm Tube secured with: Tape Dental Injury: Teeth and Oropharynx as per pre-operative assessment

## 2024-02-04 NOTE — Discharge Instructions (Signed)
 SABRA

## 2024-02-04 NOTE — Transfer of Care (Signed)
 Immediate Anesthesia Transfer of Care Note  Patient: Savannah Roy  Procedure(s) Performed: EXCISION MASS, BACK (Left: Back)  Patient Location: PACU  Anesthesia Type:General  Level of Consciousness: awake, alert , and oriented  Airway & Oxygen Therapy: Patient Spontanous Breathing and Patient connected to face mask oxygen  Post-op Assessment: Report given to RN and Post -op Vital signs reviewed and stable  Post vital signs: Reviewed and stable  Last Vitals:  Vitals Value Taken Time  BP 153/77 02/04/24 11:57  Temp    Pulse 97 02/04/24 12:00  Resp 23 02/04/24 12:00  SpO2 94 % 02/04/24 12:00  Vitals shown include unfiled device data.  Last Pain:  Vitals:   02/04/24 0917  TempSrc: Oral  PainSc: 0-No pain         Complications: No notable events documented.

## 2024-02-04 NOTE — Anesthesia Postprocedure Evaluation (Signed)
"   Anesthesia Post Note  Patient: Savannah Roy  Procedure(s) Performed: EXCISION MASS, BACK (Left: Back)     Patient location during evaluation: PACU Anesthesia Type: General Level of consciousness: awake and alert, oriented and patient cooperative Pain management: pain level controlled Vital Signs Assessment: post-procedure vital signs reviewed and stable Respiratory status: spontaneous breathing, nonlabored ventilation and respiratory function stable Cardiovascular status: blood pressure returned to baseline and stable Postop Assessment: no apparent nausea or vomiting Anesthetic complications: no   No notable events documented.  Last Vitals:  Vitals:   02/04/24 1230 02/04/24 1234  BP: (!) 153/68 (!) 143/62  Pulse: 93 91  Resp: 13 15  Temp: 36.5 C   SpO2: 97% 94%    Last Pain:  Vitals:   02/04/24 1234  TempSrc:   PainSc: 0-No pain                 Almarie HERO Darnelle Derrick      "

## 2024-02-04 NOTE — Anesthesia Preprocedure Evaluation (Addendum)
"                                    Anesthesia Evaluation  Patient identified by MRN, date of birth, ID band Patient awake    Reviewed: Allergy & Precautions, NPO status , Patient's Chart, lab work & pertinent test results  Airway Mallampati: III  TM Distance: >3 FB Neck ROM: Full    Dental  (+) Poor Dentition, Missing, Dental Advisory Given, Partial Upper, Partial Lower   Pulmonary shortness of breath and with exertion, sleep apnea , former smoker   Pulmonary exam normal breath sounds clear to auscultation       Cardiovascular hypertension, Pt. on medications Normal cardiovascular exam Rhythm:Regular Rate:Normal  1. Difficult study due to poor acoustic windows. Left ventricular  ejection fraction, by estimation, is 60 to 65%. The left ventricle has  normal function. The left ventricle has no regional wall motion  abnormalities. There is moderate concentric left  ventricular hypertrophy. Left ventricular diastolic parameters are  consistent with Grade I diastolic dysfunction (impaired relaxation).   2. Right ventricular systolic function is normal. The right ventricular  size is normal. Tricuspid regurgitation signal is inadequate for assessing  PA pressure.   3. The mitral valve is normal in structure. No evidence of mitral valve  regurgitation. No evidence of mitral stenosis.   4. The aortic valve was not well visualized. Aortic valve regurgitation  is not visualized. No aortic stenosis is present.   5. The inferior vena cava is normal in size with greater than 50%  respiratory variability, suggesting right atrial pressure of 3 mmHg   Neuro/Psych  PSYCHIATRIC DISORDERS Anxiety Depression    negative neurological ROS     GI/Hepatic negative GI ROS, Neg liver ROS,,,  Endo/Other    Class 3 obesity  Renal/GU negative Renal ROS     Musculoskeletal negative musculoskeletal ROS (+)    Abdominal  (+) + obese  Peds  Hematology negative hematology ROS (+)    Anesthesia Other Findings   Reproductive/Obstetrics negative OB ROS                              Anesthesia Physical Anesthesia Plan  ASA: 3  Anesthesia Plan: General   Post-op Pain Management: Tylenol  PO (pre-op)*   Induction: Intravenous  PONV Risk Score and Plan: 3 and Ondansetron , Dexamethasone , Midazolam  and Treatment may vary due to age or medical condition  Airway Management Planned: Oral ETT and Video Laryngoscope Planned  Additional Equipment:   Intra-op Plan:   Post-operative Plan: Extubation in OR  Informed Consent: I have reviewed the patients History and Physical, chart, labs and discussed the procedure including the risks, benefits and alternatives for the proposed anesthesia with the patient or authorized representative who has indicated his/her understanding and acceptance.     Dental advisory given  Plan Discussed with: CRNA  Anesthesia Plan Comments: (See PAT note 12/08/2020, Harlene Hoots Ward, PA-C)         Anesthesia Quick Evaluation  "

## 2024-02-04 NOTE — H&P (Signed)
 "  Admitting Physician: Deward PARAS Marti Mclane  Service: General surgery  CC: left back lipoma  Subjective   HPI: Savannah Roy is an 63 y.o. female who is here for left back lipoma  Past Medical History:  Diagnosis Date   Anxiety    Depression    Hidradenitis suppurativa    Hypertension    LVH (left ventricular hypertrophy) due to hypertensive disease 05/10/2016   Shortness of breath 04/07/2016   Sleep apnea     Past Surgical History:  Procedure Laterality Date   CHOLECYSTECTOMY N/A 12/17/2020   Procedure: LAPAROSCOPIC CHOLECYSTECTOMY;  Surgeon: Lyndel Deward PARAS, MD;  Location: [facility];  Service: General;  Laterality: N/A;   CYSTECTOMY Right    gum cyst removal   ENDOMETRIAL ABLATION W/ NOVASURE  2007   HERNIA REPAIR  1985   KNEE ARTHROSCOPY Right 2005    Family History  Problem Relation Age of Onset   Cancer Mother    Heart attack Mother    CVA Mother    Thyroid  disease Mother    Kidney failure Father    Thyroid  disease Sister    Heart failure Maternal Grandmother     Social:  reports that she quit smoking about 10 years ago. Her smoking use included cigarettes. She has never used smokeless tobacco. She reports current alcohol use. She reports that she does not use drugs.  Allergies: Allergies[1]  Medications: Current Outpatient Medications  Medication Instructions   amLODipine  (NORVASC ) 10 mg, Oral, Daily   atorvastatin  (LIPITOR) 80 mg, Oral, Daily   desvenlafaxine  (PRISTIQ ) 100 mg, Oral, Daily   doxycycline  (VIBRAMYCIN ) 100 mg, Oral, Daily   ibuprofen (ADVIL) 800 mg, Oral, Every 6 hours PRN   mupirocin  ointment (BACTROBAN ) 2 % 1 Application, Topical, Daily, Apply daily to inflamed areas   spironolactone  (ALDACTONE ) 100 mg, Oral, Daily    ROS - all of the below systems have been reviewed with the patient and positives are indicated with bold text General: chills, fever or night sweats Eyes: blurry vision or double vision ENT: epistaxis or sore  throat Allergy/Immunology: itchy/watery eyes or nasal congestion Hematologic/Lymphatic: bleeding problems, blood clots or swollen lymph nodes Endocrine: temperature intolerance or unexpected weight changes Breast: new or changing breast lumps or nipple discharge Resp: cough, shortness of breath, or wheezing CV: chest pain or dyspnea on exertion GI: as per HPI GU: dysuria, trouble voiding, or hematuria MSK: joint pain or joint stiffness Neuro: TIA or stroke symptoms Derm: pruritus and skin lesion changes Psych: anxiety and depression  Objective   PE Blood pressure (!) 154/84, pulse 87, temperature 98.2 F (36.8 C), temperature source Oral, resp. rate 18, weight (!) 145.2 kg, SpO2 99%. Constitutional: NAD; conversant; no deformities Eyes: Moist conjunctiva; no lid lag; anicteric; PERRL Neck: Trachea midline; no thyromegaly Lungs: Normal respiratory effort; no tactile fremitus CV: RRR; no palpable thrills; no pitting edema GI: Abd soft, nontender; no palpable hepatosplenomegaly MSK: Left back lipoma Left upper paraspinal back lipoma, rubbery, firm, not fixed to underlying tissues, consistent with 2.5 x 1.2 x 2.3 cm size on ultrasound  Normal range of motion of extremities; no clubbing/cyanosis Psychiatric: Appropriate affect; alert and oriented x3 Lymphatic: No palpable cervical or axillary lymphadenopathy  Results for orders placed or performed during the hospital encounter of 02/04/24 (from the past 24 hours)  Basic metabolic panel     Status: Abnormal   Collection Time: 02/04/24  9:25 AM  Result Value Ref Range   Sodium 140 135 - 145 mmol/L  Potassium 3.6 3.5 - 5.1 mmol/L   Chloride 106 98 - 111 mmol/L   CO2 20 (L) 22 - 32 mmol/L   Glucose, Bld 120 (H) 70 - 99 mg/dL   BUN 24 (H) 8 - 23 mg/dL   Creatinine, Ser 9.09 0.44 - 1.00 mg/dL   Calcium  9.3 8.9 - 10.3 mg/dL   GFR, Estimated >39 >39 mL/min   Anion gap 13 5 - 15    Imaging Orders  No imaging studies ordered today    Back ultrasound 09/19/23  2.5 x 1.2 x 2.3 cm mass of the LEFT paraspinal posterior soft tissues is nonspecific. It is not typical in appearance for a lipoma. Further evaluation with contrast enhanced CT should be performed to better evaluate this region.  CT thoracic spine 10/01/23  1. No acute abnormality of the thoracic spine. 2. No soft tissue abnormality in the region marked by the vitamin E capsule. A poorly encapsulated lipoma may not be detectable on imaging.    Assessment and Plan   Savannah Roy is an 63 y.o. female with a left back lipoma here for excision.  We discussed the procedure, its risks, benefits and alternatives and the patient granted consent to proceed.    Deward JINNY Foy, MD  [facility] Use [email or web address] to contact on call provider       [1]  Allergies Allergen Reactions   Erythromycin     REACTION: rash   "

## 2024-02-05 ENCOUNTER — Encounter (HOSPITAL_COMMUNITY): Payer: Self-pay | Admitting: Surgery

## 2024-02-05 LAB — SURGICAL PATHOLOGY

## 2024-02-11 ENCOUNTER — Other Ambulatory Visit: Payer: Self-pay | Admitting: Dermatology

## 2024-02-11 DIAGNOSIS — L732 Hidradenitis suppurativa: Secondary | ICD-10-CM

## 2024-02-12 ENCOUNTER — Encounter: Payer: Self-pay | Admitting: Oncology

## 2024-02-15 ENCOUNTER — Encounter: Payer: Self-pay | Admitting: Oncology

## 2024-02-16 ENCOUNTER — Encounter: Payer: Self-pay | Admitting: Oncology

## 2024-02-19 ENCOUNTER — Inpatient Hospital Stay

## 2024-02-19 ENCOUNTER — Telehealth: Payer: Self-pay

## 2024-02-19 ENCOUNTER — Inpatient Hospital Stay: Admitting: Oncology

## 2024-02-19 NOTE — Telephone Encounter (Signed)
 Followed up with patient due to missed Hematology Appointment with Dr. Autumn at [facility]. Left a voicemail message informing that a scheduler would reach out to patient to reschedule her missed appointment.

## 2024-02-20 ENCOUNTER — Telehealth: Payer: Self-pay | Admitting: Oncology

## 2024-02-20 NOTE — Telephone Encounter (Signed)
 PT returned phone call to have appts rescheduled; day and time confirmed.

## 2024-02-25 ENCOUNTER — Inpatient Hospital Stay: Payer: Self-pay | Attending: Oncology | Admitting: Oncology

## 2024-02-25 ENCOUNTER — Inpatient Hospital Stay: Payer: Self-pay

## 2024-02-25 VITALS — BP 119/80 | HR 85 | Temp 97.9°F | Resp 16 | Wt 322.4 lb

## 2024-02-25 DIAGNOSIS — I10 Essential (primary) hypertension: Secondary | ICD-10-CM | POA: Insufficient documentation

## 2024-02-25 DIAGNOSIS — D72819 Decreased white blood cell count, unspecified: Secondary | ICD-10-CM

## 2024-02-25 DIAGNOSIS — L732 Hidradenitis suppurativa: Secondary | ICD-10-CM | POA: Insufficient documentation

## 2024-02-25 DIAGNOSIS — Z79899 Other long term (current) drug therapy: Secondary | ICD-10-CM | POA: Insufficient documentation

## 2024-02-25 DIAGNOSIS — R946 Abnormal results of thyroid function studies: Secondary | ICD-10-CM | POA: Diagnosis not present

## 2024-02-25 DIAGNOSIS — Z79620 Long term (current) use of immunosuppressive biologic: Secondary | ICD-10-CM | POA: Diagnosis not present

## 2024-02-25 DIAGNOSIS — E538 Deficiency of other specified B group vitamins: Secondary | ICD-10-CM | POA: Diagnosis not present

## 2024-02-25 LAB — CBC WITH DIFFERENTIAL (CANCER CENTER ONLY)
Abs Immature Granulocytes: 0.01 K/uL (ref 0.00–0.07)
Basophils Absolute: 0 K/uL (ref 0.0–0.1)
Basophils Relative: 0 %
Eosinophils Absolute: 0.1 K/uL (ref 0.0–0.5)
Eosinophils Relative: 2 %
HCT: 40.6 % (ref 36.0–46.0)
Hemoglobin: 13.1 g/dL (ref 12.0–15.0)
Immature Granulocytes: 0 %
Lymphocytes Relative: 24 %
Lymphs Abs: 0.8 K/uL (ref 0.7–4.0)
MCH: 28.5 pg (ref 26.0–34.0)
MCHC: 32.3 g/dL (ref 30.0–36.0)
MCV: 88.3 fL (ref 80.0–100.0)
Monocytes Absolute: 0.2 K/uL (ref 0.1–1.0)
Monocytes Relative: 7 %
Neutro Abs: 2.2 K/uL (ref 1.7–7.7)
Neutrophils Relative %: 67 %
Platelet Count: 162 K/uL (ref 150–400)
RBC: 4.6 MIL/uL (ref 3.87–5.11)
RDW: 15.5 % (ref 11.5–15.5)
WBC Count: 3.3 K/uL — ABNORMAL LOW (ref 4.0–10.5)
nRBC: 0 % (ref 0.0–0.2)

## 2024-02-25 LAB — CMP (CANCER CENTER ONLY)
ALT: 13 U/L (ref 0–44)
AST: 32 U/L (ref 15–41)
Albumin: 3.9 g/dL (ref 3.5–5.0)
Alkaline Phosphatase: 78 U/L (ref 38–126)
Anion gap: 9 (ref 5–15)
BUN: 17 mg/dL (ref 8–23)
CO2: 26 mmol/L (ref 22–32)
Calcium: 9 mg/dL (ref 8.9–10.3)
Chloride: 106 mmol/L (ref 98–111)
Creatinine: 0.87 mg/dL (ref 0.44–1.00)
GFR, Estimated: >60 mL/min
Glucose, Bld: 95 mg/dL (ref 70–99)
Potassium: 4 mmol/L (ref 3.5–5.1)
Sodium: 141 mmol/L (ref 135–145)
Total Bilirubin: 1.6 mg/dL — ABNORMAL HIGH (ref 0.0–1.2)
Total Protein: 6.9 g/dL (ref 6.5–8.1)

## 2024-02-25 LAB — TSH: TSH: 5.06 u[IU]/mL — ABNORMAL HIGH (ref 0.350–4.500)

## 2024-02-25 LAB — T4, FREE: Free T4: 1 ng/dL (ref 0.80–2.00)

## 2024-02-25 LAB — VITAMIN B12: Vitamin B-12: 373 pg/mL (ref 180–914)

## 2024-02-25 NOTE — Progress Notes (Unsigned)
 "  [facility]  HEMATOLOGY CLINIC PROGRESS NOTE  PATIENT NAME: Savannah Roy   MR#: [id number] DOB: 1960-11-09  Patient Care Team: Job Lukes, [place] as PCP - General (Physician Assistant)  Date of visit: 02/25/2024   ASSESSMENT & PLAN:   SAKARI RAISANEN is a 64 y.o.  lady with a past medical history of hypertension, obstructive sleep apnea, hidradenitis suppurativa, anxiety/depression, was referred to our service in September 2025 for evaluation of leukopenia.  Workup showed evidence of B12 deficiency and ANA positivity.  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 Present since at least 2018, with the lowest recorded value being 2700 in June 2022.   Other blood counts, including red cells and platelets, are normal, and the proportions of different white cells are appropriate. No evidence of compromised immunity, as there are no recurrent infections, fevers, chills, or night sweats.   On her consultation with us  on 10/23/2023, labs showed leukopenia with white count of 2700, normal differential, ANC of 1900.  Hemoglobin normal at 13.1.  Platelet count was 117,000.  Creatinine 1.17, bilirubin 1.8, otherwise unremarkable CMP.  Iron studies, ferritin, folic acid , LDH were within normal limits.  Vitamin B12 was severely decreased at <150 pg/mL.  ANA screen was positive with reflex testing positive for anti-dsDNA antibody.  Rheumatoid factor was within normal limits.  Will start her on vitamin B12 injections, to be given weekly for 4 weeks followed by once every 4 weeks.  We will pursue testing for pernicious anemia on return visit. Consider endoscopy if pernicious anemia is confirmed.   - Plan follow-up visit in four months with repeat labs.   Assessment and Plan Assessment & Plan       I spent a total of 23 minutes during this encounter with the patient including review of chart and various tests results, discussions about plan of  care and coordination of care plan.  I reviewed lab results and outside records for this visit and discussed relevant results with the patient. Diagnosis, plan of care and treatment options were also discussed in detail with the patient. Opportunity provided to ask questions and answers provided to her apparent satisfaction. Provided instructions to call our clinic with any problems, questions or concerns prior to return visit. I recommended to continue follow-up with PCP and sub-specialists. She verbalized understanding and agreed with the plan. No barriers to learning was detected.  Chinita Patten, MD  02/25/2024 3:59 PM  [facility] - A DEPT OF [facility] [id number] [place] [address] Dept: [phone] Dept Fax: [phone]   CHIEF COMPLAINT/ REASON FOR VISIT:  Follow-up for chronic leukopenia.  Workup showed evidence of B12 deficiency and ANA positivity.  INTERVAL HISTORY:  Discussed the use of AI scribe software for clinical note transcription with the patient, who gave verbal consent to proceed.  History of Present Illness    SUMMARY OF HEMATOLOGIC HISTORY:  Labs at her PCPs office on 09/17/2022 showed white count of 3100 with ANC of 2100.  Hemoglobin was 13.5, platelet 141,000.  Repeat labs on 09/20/2023 showed white count of 3000 with normal differential, ANC of 1900, hemoglobin 13.4, platelet count 156,000.  Iron studies showed mild iron deficiency as noted by decreased iron saturation of 16%.  Ferritin 13.  Given persistent leukopenia, referral was sent to us  for further evaluation.   On review of records, patient has had chronic mild leukopenia at  least since 2018 with lowest count of 2700 in June 2022.   She has not experienced recurrent infections, fevers, chills, or night sweats. She occasionally feels cold, which resolves after a nap, occurring about three times in the last three months. Her  weight has been stable with no unintentional weight loss.   She has hidradenitis suppurativa with lesions on her bottom and one spot under her arm. She is under dermatological care and occasionally takes antibiotics for this condition. No recurrent infections or systemic symptoms are reported.   She notes that scratching her arm leads to spots, which she attributes to thin skin, and inquires about potential supplements like collagen or biotin.   Her son was recently diagnosed with bladder cancer.   On her consultation with us  on 10/23/2023, labs showed leukopenia with white count of 2700, normal differential, ANC of 1900.  Hemoglobin normal at 13.1.  Platelet count was 117,000.  Creatinine 1.17, bilirubin 1.8, otherwise unremarkable CMP.  Iron studies, ferritin, folic acid , LDH were within normal limits.  Vitamin B12 was severely decreased at <150 pg/mL.  ANA screen was positive with reflex testing positive for anti-dsDNA antibody.  Rheumatoid factor was within normal limits.   She was started on vitamin B12 injections, once weekly for 4 weeks followed by once every 4 weeks.  We pursued testing for pernicious anemia on 02/25/2024.  I have reviewed the past medical history, past surgical history, social history and family history with the patient and they are unchanged from previous note.  ALLERGIES: She is allergic to erythromycin.  MEDICATIONS:  Current Outpatient Medications  Medication Sig Dispense Refill   amLODipine  (NORVASC ) 10 MG tablet Take 1 tablet (10 mg total) by mouth daily. 90 tablet 1   atorvastatin  (LIPITOR) 80 MG tablet Take 1 tablet (80 mg total) by mouth daily. 90 tablet 1   desvenlafaxine  (PRISTIQ ) 100 MG 24 hr tablet TAKE 1 TABLET BY MOUTH EVERY DAY 90 tablet 1   doxycycline  (VIBRAMYCIN ) 100 MG capsule TAKE 1 CAPSULE (100 MG TOTAL) BY MOUTH DAILY. ONE DAILY WITH DINNER 30 capsule 9   ibuprofen (ADVIL) 200 MG tablet Take 800 mg by mouth every 6 (six) hours as needed for mild  pain (pain score 1-3) or moderate pain (pain score 4-6).     mupirocin  ointment (BACTROBAN ) 2 % Apply 1 Application topically daily. Apply daily to inflamed areas (Patient taking differently: Apply 1 Application topically daily as needed. Apply daily to inflamed areas) 22 g 1   oxyCODONE -acetaminophen  (PERCOCET) 5-325 MG tablet Take 1 tablet by mouth every 4 (four) hours as needed for severe pain (pain score 7-10). 20 tablet 0   spironolactone  (ALDACTONE ) 100 MG tablet Take 1 tablet (100 mg total) by mouth daily. 30 tablet 11   No current facility-administered medications for this visit.     REVIEW OF SYSTEMS:    Review of Systems - Oncology  All other pertinent systems were reviewed with the patient and are negative.  PHYSICAL EXAMINATION:   Onc Performance Status - 02/25/24 1524       ECOG Perf Status   ECOG Perf Status Restricted in physically strenuous activity but ambulatory and able to carry out work of a light or sedentary nature, e.g., light house work, office work      KPS SCALE   KPS % SCORE Normal activity with effort, some s/s of disease          Vitals:   02/25/24 1506 02/25/24 1507  BP: (!) 99/58 119/80  Pulse: 85   Resp: 16   Temp: 97.9 F (36.6 C)   SpO2: 97%    Filed Weights   02/25/24 1506  Weight: (!) 322 lb 6.4 oz (146.2 kg)    Physical Exam Constitutional:      General: She is not in acute distress.    Appearance: Normal appearance.  HENT:     Head: Normocephalic and atraumatic.  Cardiovascular:     Rate and Rhythm: Normal rate.  Pulmonary:     Effort: Pulmonary effort is normal. No respiratory distress.  Abdominal:     General: There is no distension.  Neurological:     General: No focal deficit present.     Mental Status: She is alert and oriented to person, place, and time.  Psychiatric:        Mood and Affect: Mood normal.        Behavior: Behavior normal.     LABORATORY DATA:   I have reviewed the data as listed.  Results  for orders placed or performed in visit on 02/25/24  TSH  Result Value Ref Range   TSH 5.060 (H) 0.350 - 4.500 uIU/mL  CMP (Cancer Center only)  Result Value Ref Range   Sodium 141 135 - 145 mmol/L   Potassium 4.0 3.5 - 5.1 mmol/L   Chloride 106 98 - 111 mmol/L   CO2 26 22 - 32 mmol/L   Glucose, Bld 95 70 - 99 mg/dL   BUN 17 8 - 23 mg/dL   Creatinine 9.12 9.55 - 1.00 mg/dL   Calcium  9.0 8.9 - 10.3 mg/dL   Total Protein 6.9 6.5 - 8.1 g/dL   Albumin 3.9 3.5 - 5.0 g/dL   AST 32 15 - 41 U/L   ALT 13 0 - 44 U/L   Alkaline Phosphatase 78 38 - 126 U/L   Total Bilirubin 1.6 (H) 0.0 - 1.2 mg/dL   GFR, Estimated >39 >39 mL/min   Anion gap 9 5 - 15  CBC with Differential (Cancer Center Only)  Result Value Ref Range   WBC Count 3.3 (L) 4.0 - 10.5 K/uL   RBC 4.60 3.87 - 5.11 MIL/uL   Hemoglobin 13.1 12.0 - 15.0 g/dL   HCT 59.3 63.9 - 53.9 %   MCV 88.3 80.0 - 100.0 fL   MCH 28.5 26.0 - 34.0 pg   MCHC 32.3 30.0 - 36.0 g/dL   RDW 84.4 88.4 - 84.4 %   Platelet Count 162 150 - 400 K/uL   nRBC 0.0 0.0 - 0.2 %   Neutrophils Relative % 67 %   Neutro Abs 2.2 1.7 - 7.7 K/uL   Lymphocytes Relative 24 %   Lymphs Abs 0.8 0.7 - 4.0 K/uL   Monocytes Relative 7 %   Monocytes Absolute 0.2 0.1 - 1.0 K/uL   Eosinophils Relative 2 %   Eosinophils Absolute 0.1 0.0 - 0.5 K/uL   Basophils Relative 0 %   Basophils Absolute 0.0 0.0 - 0.1 K/uL   Immature Granulocytes 0 %   Abs Immature Granulocytes 0.01 0.00 - 0.07 K/uL    RADIOGRAPHIC STUDIES:  No recent pertinent imaging studies available to review.  No orders of the defined types were placed in this encounter.    Future Appointments  Date Time Provider Department Center  03/18/2024  1:15 PM Alm Delon SAILOR, DO [facility] None     This document was completed utilizing speech recognition software. Grammatical errors, random word insertions, pronoun errors, and incomplete sentences are an  occasional consequence of this system due to software  limitations, ambient noise, and hardware issues. Any formal questions or concerns about the content, text or information contained within the body of this dictation should be directly addressed to the provider for clarification.  "

## 2024-02-25 NOTE — Assessment & Plan Note (Signed)
 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 Present since at least 2018, with the lowest recorded value being 2700 in June 2022.   Other blood counts, including red cells and platelets, are normal, and the proportions of different white cells are appropriate. No evidence of compromised immunity, as there are no recurrent infections, fevers, chills, or night sweats.   On her consultation with us  on 10/23/2023, labs showed leukopenia with white count of 2700, normal differential, ANC of 1900.  Hemoglobin normal at 13.1.  Platelet count was 117,000.  Creatinine 1.17, bilirubin 1.8, otherwise unremarkable CMP.  Iron studies, ferritin, folic acid , LDH were within normal limits.  Vitamin B12 was severely decreased at <150 pg/mL.  ANA screen was positive with reflex testing positive for anti-dsDNA antibody.  Rheumatoid factor was within normal limits.  Will start her on vitamin B12 injections, to be given weekly for 4 weeks followed by once every 4 weeks.  We will pursue testing for pernicious anemia on return visit. Consider endoscopy if pernicious anemia is confirmed.   - Plan follow-up visit in four months with repeat labs.

## 2024-02-26 LAB — ANTI-PARIETAL ANTIBODY: Parietal Cell Antibody-IgG: 6.2 U (ref 0.0–20.0)

## 2024-02-26 LAB — INTRINSIC FACTOR ANTIBODIES: Intrinsic Factor: 1 [AU]/ml (ref 0.0–1.1)

## 2024-02-26 LAB — T3: T3, Total: 140 ng/dL (ref 71–180)

## 2024-02-28 LAB — METHYLMALONIC ACID, SERUM: Methylmalonic Acid, Quantitative: 219 nmol/L (ref 0–378)

## 2024-02-29 ENCOUNTER — Encounter: Payer: Self-pay | Admitting: Oncology

## 2024-03-03 ENCOUNTER — Telehealth: Payer: Self-pay

## 2024-03-03 NOTE — Telephone Encounter (Signed)
 Followed up with patient per Dr. Autumn to share the following information: Based on recent lab results, patient does not have pernicious anemia.  She does not have B12 absorption problem. She can take vitamin B12 1000 mcg daily, over-the-counter for her history of B12 deficiency.  She does not need B12 injections anymore. This information was left on patient's voicemail as she identified herself using her full name in her voicemail greeting.

## 2024-03-13 ENCOUNTER — Encounter: Payer: Self-pay | Admitting: Oncology

## 2024-03-18 ENCOUNTER — Ambulatory Visit: Admitting: Dermatology

## 2024-06-24 ENCOUNTER — Inpatient Hospital Stay

## 2024-06-24 ENCOUNTER — Inpatient Hospital Stay: Admitting: Oncology
